# Patient Record
Sex: Female | Born: 1950 | Race: White | Hispanic: No | Marital: Married | Smoking: Never smoker
Health system: Southern US, Community
[De-identification: ages and names within clinical notes are randomized; demographics above are authoritative.]

## PROBLEM LIST (undated history)

## (undated) DIAGNOSIS — F32A Depression, unspecified: Secondary | ICD-10-CM

## (undated) DIAGNOSIS — J45909 Unspecified asthma, uncomplicated: Secondary | ICD-10-CM

## (undated) DIAGNOSIS — E039 Hypothyroidism, unspecified: Secondary | ICD-10-CM

## (undated) DIAGNOSIS — E041 Nontoxic single thyroid nodule: Secondary | ICD-10-CM

## (undated) DIAGNOSIS — I10 Essential (primary) hypertension: Secondary | ICD-10-CM

## (undated) DIAGNOSIS — F419 Anxiety disorder, unspecified: Secondary | ICD-10-CM

## (undated) DIAGNOSIS — R011 Cardiac murmur, unspecified: Secondary | ICD-10-CM

## (undated) DIAGNOSIS — J189 Pneumonia, unspecified organism: Secondary | ICD-10-CM

## (undated) DIAGNOSIS — R7303 Prediabetes: Secondary | ICD-10-CM

## (undated) DIAGNOSIS — M1990 Unspecified osteoarthritis, unspecified site: Secondary | ICD-10-CM

## (undated) DIAGNOSIS — R51 Headache: Secondary | ICD-10-CM

## (undated) DIAGNOSIS — R519 Headache, unspecified: Secondary | ICD-10-CM

## (undated) DIAGNOSIS — K219 Gastro-esophageal reflux disease without esophagitis: Secondary | ICD-10-CM

## (undated) HISTORY — PX: OTHER SURGICAL HISTORY: SHX169

## (undated) HISTORY — PX: ABDOMINAL HYSTERECTOMY: SHX81

## (undated) HISTORY — PX: THYROID LOBECTOMY: SHX420

---

## 1998-10-17 ENCOUNTER — Ambulatory Visit (HOSPITAL_COMMUNITY): Admission: EM | Admit: 1998-10-17 | Discharge: 1998-10-17 | Payer: Self-pay | Admitting: Emergency Medicine

## 1998-11-06 ENCOUNTER — Ambulatory Visit (HOSPITAL_COMMUNITY): Admission: RE | Admit: 1998-11-06 | Discharge: 1998-11-06 | Payer: Self-pay | Admitting: Internal Medicine

## 1998-11-06 ENCOUNTER — Encounter: Payer: Self-pay | Admitting: Internal Medicine

## 1998-11-21 ENCOUNTER — Encounter: Payer: Self-pay | Admitting: Internal Medicine

## 1998-11-21 ENCOUNTER — Ambulatory Visit (HOSPITAL_COMMUNITY): Admission: RE | Admit: 1998-11-21 | Discharge: 1998-11-21 | Payer: Self-pay | Admitting: Internal Medicine

## 1999-04-18 ENCOUNTER — Encounter: Payer: Self-pay | Admitting: Obstetrics and Gynecology

## 1999-04-18 ENCOUNTER — Ambulatory Visit (HOSPITAL_COMMUNITY): Admission: RE | Admit: 1999-04-18 | Discharge: 1999-04-18 | Payer: Self-pay | Admitting: Obstetrics and Gynecology

## 1999-05-25 ENCOUNTER — Other Ambulatory Visit: Admission: RE | Admit: 1999-05-25 | Discharge: 1999-05-25 | Payer: Self-pay | Admitting: Endocrinology

## 1999-09-18 ENCOUNTER — Encounter: Payer: Self-pay | Admitting: Obstetrics and Gynecology

## 1999-09-18 ENCOUNTER — Encounter: Admission: RE | Admit: 1999-09-18 | Discharge: 1999-09-18 | Payer: Self-pay | Admitting: Obstetrics and Gynecology

## 2001-02-16 ENCOUNTER — Encounter: Payer: Self-pay | Admitting: Obstetrics and Gynecology

## 2001-02-16 ENCOUNTER — Encounter: Admission: RE | Admit: 2001-02-16 | Discharge: 2001-02-16 | Payer: Self-pay | Admitting: Obstetrics and Gynecology

## 2002-02-22 ENCOUNTER — Encounter: Payer: Self-pay | Admitting: Obstetrics and Gynecology

## 2002-02-22 ENCOUNTER — Encounter: Admission: RE | Admit: 2002-02-22 | Discharge: 2002-02-22 | Payer: Self-pay | Admitting: Obstetrics and Gynecology

## 2003-02-27 ENCOUNTER — Encounter: Payer: Self-pay | Admitting: Obstetrics and Gynecology

## 2003-02-27 ENCOUNTER — Encounter: Admission: RE | Admit: 2003-02-27 | Discharge: 2003-02-27 | Payer: Self-pay | Admitting: Family Medicine

## 2004-02-29 ENCOUNTER — Encounter: Admission: RE | Admit: 2004-02-29 | Discharge: 2004-02-29 | Payer: Self-pay | Admitting: Obstetrics and Gynecology

## 2010-10-22 ENCOUNTER — Other Ambulatory Visit: Payer: Self-pay | Admitting: Internal Medicine

## 2010-10-22 DIAGNOSIS — E041 Nontoxic single thyroid nodule: Secondary | ICD-10-CM

## 2010-10-30 ENCOUNTER — Ambulatory Visit
Admission: RE | Admit: 2010-10-30 | Discharge: 2010-10-30 | Disposition: A | Discharge status: Home / Self Care | Payer: 59 | Source: Ambulatory Visit | Attending: Internal Medicine | Admitting: Internal Medicine

## 2010-10-30 DIAGNOSIS — E041 Nontoxic single thyroid nodule: Secondary | ICD-10-CM

## 2010-11-04 ENCOUNTER — Other Ambulatory Visit: Payer: Self-pay | Admitting: Internal Medicine

## 2010-11-04 DIAGNOSIS — E041 Nontoxic single thyroid nodule: Secondary | ICD-10-CM

## 2010-11-13 ENCOUNTER — Other Ambulatory Visit: Payer: Self-pay | Admitting: Diagnostic Radiology

## 2010-11-13 ENCOUNTER — Ambulatory Visit
Admission: RE | Admit: 2010-11-13 | Discharge: 2010-11-13 | Disposition: A | Discharge status: Home / Self Care | Payer: 59 | Source: Ambulatory Visit

## 2010-11-13 ENCOUNTER — Other Ambulatory Visit (HOSPITAL_COMMUNITY)
Admission: RE | Admit: 2010-11-13 | Discharge: 2010-11-13 | Disposition: A | Discharge status: Home / Self Care | Payer: 59 | Source: Ambulatory Visit | Attending: Diagnostic Radiology | Admitting: Diagnostic Radiology

## 2010-11-13 DIAGNOSIS — E049 Nontoxic goiter, unspecified: Secondary | ICD-10-CM | POA: Insufficient documentation

## 2010-11-13 DIAGNOSIS — E041 Nontoxic single thyroid nodule: Secondary | ICD-10-CM

## 2010-11-22 ENCOUNTER — Other Ambulatory Visit: Payer: Self-pay | Admitting: Internal Medicine

## 2010-11-22 ENCOUNTER — Other Ambulatory Visit: Payer: Self-pay | Admitting: *Deleted

## 2010-11-22 DIAGNOSIS — E041 Nontoxic single thyroid nodule: Secondary | ICD-10-CM

## 2011-10-22 ENCOUNTER — Other Ambulatory Visit: Payer: Self-pay | Admitting: Internal Medicine

## 2011-10-22 DIAGNOSIS — E041 Nontoxic single thyroid nodule: Secondary | ICD-10-CM

## 2011-11-20 ENCOUNTER — Ambulatory Visit
Admission: RE | Admit: 2011-11-20 | Discharge: 2011-11-20 | Disposition: A | Discharge status: Home / Self Care | Payer: 59 | Source: Ambulatory Visit | Attending: Internal Medicine | Admitting: Internal Medicine

## 2011-11-20 DIAGNOSIS — E041 Nontoxic single thyroid nodule: Secondary | ICD-10-CM

## 2012-11-19 ENCOUNTER — Ambulatory Visit
Admission: RE | Admit: 2012-11-19 | Discharge: 2012-11-19 | Disposition: A | Discharge status: Home / Self Care | Payer: 59 | Source: Ambulatory Visit | Attending: Internal Medicine | Admitting: Internal Medicine

## 2012-11-19 ENCOUNTER — Other Ambulatory Visit: Payer: Self-pay | Admitting: Internal Medicine

## 2012-11-19 DIAGNOSIS — E041 Nontoxic single thyroid nodule: Secondary | ICD-10-CM

## 2012-11-22 ENCOUNTER — Other Ambulatory Visit: Payer: Self-pay | Admitting: Family Medicine

## 2012-11-22 ENCOUNTER — Ambulatory Visit
Admission: RE | Admit: 2012-11-22 | Discharge: 2012-11-22 | Disposition: A | Discharge status: Home / Self Care | Payer: 59 | Source: Ambulatory Visit | Attending: Family Medicine | Admitting: Family Medicine

## 2012-11-22 DIAGNOSIS — M79601 Pain in right arm: Secondary | ICD-10-CM

## 2012-11-22 DIAGNOSIS — M25561 Pain in right knee: Secondary | ICD-10-CM

## 2013-07-25 ENCOUNTER — Other Ambulatory Visit: Payer: Self-pay | Admitting: Family Medicine

## 2013-07-25 ENCOUNTER — Ambulatory Visit
Admission: RE | Admit: 2013-07-25 | Discharge: 2013-07-25 | Disposition: A | Discharge status: Home / Self Care | Payer: 59 | Source: Ambulatory Visit | Attending: Family Medicine | Admitting: Family Medicine

## 2013-07-25 DIAGNOSIS — R21 Rash and other nonspecific skin eruption: Secondary | ICD-10-CM

## 2013-07-25 MED ORDER — IOHEXOL 300 MG/ML  SOLN
40.0000 mL | Freq: Once | INTRAMUSCULAR | Status: AC | PRN
  Administered 2013-07-25: 40 mL via ORAL

## 2013-07-25 MED ORDER — IOHEXOL 300 MG/ML  SOLN
100.0000 mL | Freq: Once | INTRAMUSCULAR | Status: AC | PRN
  Administered 2013-07-25: 100 mL via INTRAVENOUS

## 2013-08-04 ENCOUNTER — Other Ambulatory Visit: Payer: Self-pay

## 2013-08-04 DIAGNOSIS — Z1231 Encounter for screening mammogram for malignant neoplasm of breast: Secondary | ICD-10-CM

## 2013-08-18 ENCOUNTER — Ambulatory Visit
Admission: RE | Admit: 2013-08-18 | Discharge: 2013-08-18 | Disposition: A | Discharge status: Home / Self Care | Payer: 59 | Source: Ambulatory Visit

## 2013-08-18 DIAGNOSIS — Z1231 Encounter for screening mammogram for malignant neoplasm of breast: Secondary | ICD-10-CM

## 2014-06-23 DIAGNOSIS — Z23 Encounter for immunization: Secondary | ICD-10-CM | POA: Diagnosis not present

## 2014-06-29 DIAGNOSIS — J301 Allergic rhinitis due to pollen: Secondary | ICD-10-CM | POA: Diagnosis not present

## 2014-06-29 DIAGNOSIS — J3089 Other allergic rhinitis: Secondary | ICD-10-CM | POA: Diagnosis not present

## 2014-07-10 DIAGNOSIS — J3089 Other allergic rhinitis: Secondary | ICD-10-CM | POA: Diagnosis not present

## 2014-07-10 DIAGNOSIS — J301 Allergic rhinitis due to pollen: Secondary | ICD-10-CM | POA: Diagnosis not present

## 2014-07-19 DIAGNOSIS — J4540 Moderate persistent asthma, uncomplicated: Secondary | ICD-10-CM | POA: Diagnosis not present

## 2014-07-19 DIAGNOSIS — J3089 Other allergic rhinitis: Secondary | ICD-10-CM | POA: Diagnosis not present

## 2014-07-21 DIAGNOSIS — I10 Essential (primary) hypertension: Secondary | ICD-10-CM | POA: Diagnosis not present

## 2014-07-21 DIAGNOSIS — M7989 Other specified soft tissue disorders: Secondary | ICD-10-CM | POA: Diagnosis not present

## 2014-07-21 DIAGNOSIS — R609 Edema, unspecified: Secondary | ICD-10-CM | POA: Diagnosis not present

## 2014-07-21 DIAGNOSIS — M7122 Synovial cyst of popliteal space [Baker], left knee: Secondary | ICD-10-CM | POA: Diagnosis not present

## 2014-07-27 DIAGNOSIS — J3089 Other allergic rhinitis: Secondary | ICD-10-CM | POA: Diagnosis not present

## 2014-07-27 DIAGNOSIS — J301 Allergic rhinitis due to pollen: Secondary | ICD-10-CM | POA: Diagnosis not present

## 2014-08-09 ENCOUNTER — Other Ambulatory Visit (HOSPITAL_COMMUNITY)
Admission: RE | Admit: 2014-08-09 | Discharge: 2014-08-09 | Disposition: A | Discharge status: Home / Self Care | Payer: Medicare Other | Source: Ambulatory Visit | Attending: Family Medicine | Admitting: Family Medicine

## 2014-08-09 ENCOUNTER — Other Ambulatory Visit: Payer: Self-pay | Admitting: Family Medicine

## 2014-08-09 DIAGNOSIS — I10 Essential (primary) hypertension: Secondary | ICD-10-CM | POA: Diagnosis not present

## 2014-08-09 DIAGNOSIS — Z124 Encounter for screening for malignant neoplasm of cervix: Secondary | ICD-10-CM | POA: Insufficient documentation

## 2014-08-09 DIAGNOSIS — Z0000 Encounter for general adult medical examination without abnormal findings: Secondary | ICD-10-CM | POA: Diagnosis not present

## 2014-08-10 DIAGNOSIS — J3089 Other allergic rhinitis: Secondary | ICD-10-CM | POA: Diagnosis not present

## 2014-08-10 DIAGNOSIS — J301 Allergic rhinitis due to pollen: Secondary | ICD-10-CM | POA: Diagnosis not present

## 2014-08-11 LAB — CYTOLOGY - PAP

## 2014-08-24 DIAGNOSIS — J3089 Other allergic rhinitis: Secondary | ICD-10-CM | POA: Diagnosis not present

## 2014-08-24 DIAGNOSIS — J301 Allergic rhinitis due to pollen: Secondary | ICD-10-CM | POA: Diagnosis not present

## 2014-09-04 DIAGNOSIS — J3089 Other allergic rhinitis: Secondary | ICD-10-CM | POA: Diagnosis not present

## 2014-09-04 DIAGNOSIS — J301 Allergic rhinitis due to pollen: Secondary | ICD-10-CM | POA: Diagnosis not present

## 2014-09-20 DIAGNOSIS — J3089 Other allergic rhinitis: Secondary | ICD-10-CM | POA: Diagnosis not present

## 2014-09-20 DIAGNOSIS — J301 Allergic rhinitis due to pollen: Secondary | ICD-10-CM | POA: Diagnosis not present

## 2014-10-03 DIAGNOSIS — J3089 Other allergic rhinitis: Secondary | ICD-10-CM | POA: Diagnosis not present

## 2014-10-03 DIAGNOSIS — J301 Allergic rhinitis due to pollen: Secondary | ICD-10-CM | POA: Diagnosis not present

## 2014-10-17 DIAGNOSIS — J3089 Other allergic rhinitis: Secondary | ICD-10-CM | POA: Diagnosis not present

## 2014-10-17 DIAGNOSIS — J301 Allergic rhinitis due to pollen: Secondary | ICD-10-CM | POA: Diagnosis not present

## 2014-10-31 DIAGNOSIS — J3089 Other allergic rhinitis: Secondary | ICD-10-CM | POA: Diagnosis not present

## 2014-10-31 DIAGNOSIS — J301 Allergic rhinitis due to pollen: Secondary | ICD-10-CM | POA: Diagnosis not present

## 2014-11-13 DIAGNOSIS — J301 Allergic rhinitis due to pollen: Secondary | ICD-10-CM | POA: Diagnosis not present

## 2014-11-23 DIAGNOSIS — E039 Hypothyroidism, unspecified: Secondary | ICD-10-CM | POA: Diagnosis not present

## 2014-11-30 ENCOUNTER — Other Ambulatory Visit: Payer: Self-pay | Admitting: Internal Medicine

## 2014-11-30 DIAGNOSIS — E041 Nontoxic single thyroid nodule: Secondary | ICD-10-CM | POA: Diagnosis not present

## 2014-11-30 DIAGNOSIS — E039 Hypothyroidism, unspecified: Secondary | ICD-10-CM | POA: Diagnosis not present

## 2014-11-30 DIAGNOSIS — J3089 Other allergic rhinitis: Secondary | ICD-10-CM | POA: Diagnosis not present

## 2014-11-30 DIAGNOSIS — J301 Allergic rhinitis due to pollen: Secondary | ICD-10-CM | POA: Diagnosis not present

## 2014-12-05 ENCOUNTER — Ambulatory Visit
Admission: RE | Admit: 2014-12-05 | Discharge: 2014-12-05 | Disposition: A | Discharge status: Home / Self Care | Payer: Medicare Other | Source: Ambulatory Visit | Attending: Internal Medicine | Admitting: Internal Medicine

## 2014-12-05 DIAGNOSIS — E041 Nontoxic single thyroid nodule: Secondary | ICD-10-CM | POA: Diagnosis not present

## 2014-12-13 DIAGNOSIS — J301 Allergic rhinitis due to pollen: Secondary | ICD-10-CM | POA: Diagnosis not present

## 2014-12-13 DIAGNOSIS — J3089 Other allergic rhinitis: Secondary | ICD-10-CM | POA: Diagnosis not present

## 2014-12-29 DIAGNOSIS — J3089 Other allergic rhinitis: Secondary | ICD-10-CM | POA: Diagnosis not present

## 2015-01-10 DIAGNOSIS — J301 Allergic rhinitis due to pollen: Secondary | ICD-10-CM | POA: Diagnosis not present

## 2015-01-10 DIAGNOSIS — J3089 Other allergic rhinitis: Secondary | ICD-10-CM | POA: Diagnosis not present

## 2015-01-22 DIAGNOSIS — J4530 Mild persistent asthma, uncomplicated: Secondary | ICD-10-CM | POA: Diagnosis not present

## 2015-01-22 DIAGNOSIS — J3089 Other allergic rhinitis: Secondary | ICD-10-CM | POA: Diagnosis not present

## 2015-01-22 DIAGNOSIS — J019 Acute sinusitis, unspecified: Secondary | ICD-10-CM | POA: Diagnosis not present

## 2015-01-29 DIAGNOSIS — M1712 Unilateral primary osteoarthritis, left knee: Secondary | ICD-10-CM | POA: Diagnosis not present

## 2015-01-31 DIAGNOSIS — J3089 Other allergic rhinitis: Secondary | ICD-10-CM | POA: Diagnosis not present

## 2015-01-31 DIAGNOSIS — J301 Allergic rhinitis due to pollen: Secondary | ICD-10-CM | POA: Diagnosis not present

## 2015-02-19 DIAGNOSIS — J3089 Other allergic rhinitis: Secondary | ICD-10-CM | POA: Diagnosis not present

## 2015-02-19 DIAGNOSIS — J301 Allergic rhinitis due to pollen: Secondary | ICD-10-CM | POA: Diagnosis not present

## 2015-02-19 DIAGNOSIS — M1712 Unilateral primary osteoarthritis, left knee: Secondary | ICD-10-CM | POA: Diagnosis not present

## 2015-02-22 DIAGNOSIS — J301 Allergic rhinitis due to pollen: Secondary | ICD-10-CM | POA: Diagnosis not present

## 2015-02-22 DIAGNOSIS — J3089 Other allergic rhinitis: Secondary | ICD-10-CM | POA: Diagnosis not present

## 2015-02-26 DIAGNOSIS — M1712 Unilateral primary osteoarthritis, left knee: Secondary | ICD-10-CM | POA: Diagnosis not present

## 2015-03-05 DIAGNOSIS — M1712 Unilateral primary osteoarthritis, left knee: Secondary | ICD-10-CM | POA: Diagnosis not present

## 2015-03-05 DIAGNOSIS — J301 Allergic rhinitis due to pollen: Secondary | ICD-10-CM | POA: Diagnosis not present

## 2015-03-05 DIAGNOSIS — J3089 Other allergic rhinitis: Secondary | ICD-10-CM | POA: Diagnosis not present

## 2015-03-12 DIAGNOSIS — J301 Allergic rhinitis due to pollen: Secondary | ICD-10-CM | POA: Diagnosis not present

## 2015-03-12 DIAGNOSIS — J3089 Other allergic rhinitis: Secondary | ICD-10-CM | POA: Diagnosis not present

## 2015-03-15 DIAGNOSIS — J3089 Other allergic rhinitis: Secondary | ICD-10-CM | POA: Diagnosis not present

## 2015-03-15 DIAGNOSIS — J301 Allergic rhinitis due to pollen: Secondary | ICD-10-CM | POA: Diagnosis not present

## 2015-03-28 DIAGNOSIS — J3089 Other allergic rhinitis: Secondary | ICD-10-CM | POA: Diagnosis not present

## 2015-03-28 DIAGNOSIS — J301 Allergic rhinitis due to pollen: Secondary | ICD-10-CM | POA: Diagnosis not present

## 2015-03-29 DIAGNOSIS — Z23 Encounter for immunization: Secondary | ICD-10-CM | POA: Diagnosis not present

## 2015-03-30 DIAGNOSIS — J3089 Other allergic rhinitis: Secondary | ICD-10-CM | POA: Diagnosis not present

## 2015-03-30 DIAGNOSIS — J301 Allergic rhinitis due to pollen: Secondary | ICD-10-CM | POA: Diagnosis not present

## 2015-04-05 DIAGNOSIS — J301 Allergic rhinitis due to pollen: Secondary | ICD-10-CM | POA: Diagnosis not present

## 2015-04-05 DIAGNOSIS — J3089 Other allergic rhinitis: Secondary | ICD-10-CM | POA: Diagnosis not present

## 2015-04-18 DIAGNOSIS — Z1283 Encounter for screening for malignant neoplasm of skin: Secondary | ICD-10-CM | POA: Diagnosis not present

## 2015-04-18 DIAGNOSIS — J301 Allergic rhinitis due to pollen: Secondary | ICD-10-CM | POA: Diagnosis not present

## 2015-04-18 DIAGNOSIS — J3089 Other allergic rhinitis: Secondary | ICD-10-CM | POA: Diagnosis not present

## 2015-04-18 DIAGNOSIS — L281 Prurigo nodularis: Secondary | ICD-10-CM | POA: Diagnosis not present

## 2015-04-30 DIAGNOSIS — J3089 Other allergic rhinitis: Secondary | ICD-10-CM | POA: Diagnosis not present

## 2015-04-30 DIAGNOSIS — J301 Allergic rhinitis due to pollen: Secondary | ICD-10-CM | POA: Diagnosis not present

## 2015-05-16 DIAGNOSIS — J301 Allergic rhinitis due to pollen: Secondary | ICD-10-CM | POA: Diagnosis not present

## 2015-05-16 DIAGNOSIS — J3089 Other allergic rhinitis: Secondary | ICD-10-CM | POA: Diagnosis not present

## 2015-05-16 DIAGNOSIS — J4530 Mild persistent asthma, uncomplicated: Secondary | ICD-10-CM | POA: Diagnosis not present

## 2015-05-31 DIAGNOSIS — J3089 Other allergic rhinitis: Secondary | ICD-10-CM | POA: Diagnosis not present

## 2015-05-31 DIAGNOSIS — J301 Allergic rhinitis due to pollen: Secondary | ICD-10-CM | POA: Diagnosis not present

## 2015-06-22 DIAGNOSIS — J3089 Other allergic rhinitis: Secondary | ICD-10-CM | POA: Diagnosis not present

## 2015-06-22 DIAGNOSIS — J301 Allergic rhinitis due to pollen: Secondary | ICD-10-CM | POA: Diagnosis not present

## 2015-07-05 DIAGNOSIS — J3089 Other allergic rhinitis: Secondary | ICD-10-CM | POA: Diagnosis not present

## 2015-07-05 DIAGNOSIS — J301 Allergic rhinitis due to pollen: Secondary | ICD-10-CM | POA: Diagnosis not present

## 2015-07-20 DIAGNOSIS — J3089 Other allergic rhinitis: Secondary | ICD-10-CM | POA: Diagnosis not present

## 2015-07-20 DIAGNOSIS — J301 Allergic rhinitis due to pollen: Secondary | ICD-10-CM | POA: Diagnosis not present

## 2015-07-26 DIAGNOSIS — F312 Bipolar disorder, current episode manic severe with psychotic features: Secondary | ICD-10-CM | POA: Diagnosis not present

## 2015-08-01 DIAGNOSIS — J3089 Other allergic rhinitis: Secondary | ICD-10-CM | POA: Diagnosis not present

## 2015-08-01 DIAGNOSIS — J301 Allergic rhinitis due to pollen: Secondary | ICD-10-CM | POA: Diagnosis not present

## 2015-08-13 DIAGNOSIS — Z0000 Encounter for general adult medical examination without abnormal findings: Secondary | ICD-10-CM | POA: Diagnosis not present

## 2015-08-13 DIAGNOSIS — E669 Obesity, unspecified: Secondary | ICD-10-CM | POA: Diagnosis not present

## 2015-08-13 DIAGNOSIS — R609 Edema, unspecified: Secondary | ICD-10-CM | POA: Diagnosis not present

## 2015-08-13 DIAGNOSIS — Z1239 Encounter for other screening for malignant neoplasm of breast: Secondary | ICD-10-CM | POA: Diagnosis not present

## 2015-08-13 DIAGNOSIS — E041 Nontoxic single thyroid nodule: Secondary | ICD-10-CM | POA: Diagnosis not present

## 2015-08-13 DIAGNOSIS — E039 Hypothyroidism, unspecified: Secondary | ICD-10-CM | POA: Diagnosis not present

## 2015-08-13 DIAGNOSIS — I10 Essential (primary) hypertension: Secondary | ICD-10-CM | POA: Diagnosis not present

## 2015-08-13 DIAGNOSIS — M1990 Unspecified osteoarthritis, unspecified site: Secondary | ICD-10-CM | POA: Diagnosis not present

## 2015-08-13 DIAGNOSIS — M2040 Other hammer toe(s) (acquired), unspecified foot: Secondary | ICD-10-CM | POA: Diagnosis not present

## 2015-08-13 DIAGNOSIS — L409 Psoriasis, unspecified: Secondary | ICD-10-CM | POA: Diagnosis not present

## 2015-08-13 DIAGNOSIS — E781 Pure hyperglyceridemia: Secondary | ICD-10-CM | POA: Diagnosis not present

## 2015-08-23 DIAGNOSIS — J019 Acute sinusitis, unspecified: Secondary | ICD-10-CM | POA: Diagnosis not present

## 2015-08-24 DIAGNOSIS — J3089 Other allergic rhinitis: Secondary | ICD-10-CM | POA: Diagnosis not present

## 2015-08-24 DIAGNOSIS — J301 Allergic rhinitis due to pollen: Secondary | ICD-10-CM | POA: Diagnosis not present

## 2015-08-24 DIAGNOSIS — Z1231 Encounter for screening mammogram for malignant neoplasm of breast: Secondary | ICD-10-CM | POA: Diagnosis not present

## 2015-09-06 DIAGNOSIS — J301 Allergic rhinitis due to pollen: Secondary | ICD-10-CM | POA: Diagnosis not present

## 2015-09-20 DIAGNOSIS — J3089 Other allergic rhinitis: Secondary | ICD-10-CM | POA: Diagnosis not present

## 2015-09-20 DIAGNOSIS — J301 Allergic rhinitis due to pollen: Secondary | ICD-10-CM | POA: Diagnosis not present

## 2015-10-04 DIAGNOSIS — J3089 Other allergic rhinitis: Secondary | ICD-10-CM | POA: Diagnosis not present

## 2015-10-04 DIAGNOSIS — J301 Allergic rhinitis due to pollen: Secondary | ICD-10-CM | POA: Diagnosis not present

## 2015-10-17 DIAGNOSIS — J3089 Other allergic rhinitis: Secondary | ICD-10-CM | POA: Diagnosis not present

## 2015-10-17 DIAGNOSIS — J301 Allergic rhinitis due to pollen: Secondary | ICD-10-CM | POA: Diagnosis not present

## 2015-11-01 DIAGNOSIS — J301 Allergic rhinitis due to pollen: Secondary | ICD-10-CM | POA: Diagnosis not present

## 2015-11-01 DIAGNOSIS — J3089 Other allergic rhinitis: Secondary | ICD-10-CM | POA: Diagnosis not present

## 2015-11-08 DIAGNOSIS — J3089 Other allergic rhinitis: Secondary | ICD-10-CM | POA: Diagnosis not present

## 2015-11-08 DIAGNOSIS — J301 Allergic rhinitis due to pollen: Secondary | ICD-10-CM | POA: Diagnosis not present

## 2015-11-14 DIAGNOSIS — J301 Allergic rhinitis due to pollen: Secondary | ICD-10-CM | POA: Diagnosis not present

## 2015-11-14 DIAGNOSIS — J3089 Other allergic rhinitis: Secondary | ICD-10-CM | POA: Diagnosis not present

## 2015-11-14 DIAGNOSIS — J4530 Mild persistent asthma, uncomplicated: Secondary | ICD-10-CM | POA: Diagnosis not present

## 2015-11-29 DIAGNOSIS — J301 Allergic rhinitis due to pollen: Secondary | ICD-10-CM | POA: Diagnosis not present

## 2015-11-29 DIAGNOSIS — J3089 Other allergic rhinitis: Secondary | ICD-10-CM | POA: Diagnosis not present

## 2015-12-05 DIAGNOSIS — E039 Hypothyroidism, unspecified: Secondary | ICD-10-CM | POA: Diagnosis not present

## 2015-12-13 DIAGNOSIS — E041 Nontoxic single thyroid nodule: Secondary | ICD-10-CM | POA: Diagnosis not present

## 2015-12-13 DIAGNOSIS — J301 Allergic rhinitis due to pollen: Secondary | ICD-10-CM | POA: Diagnosis not present

## 2015-12-13 DIAGNOSIS — E039 Hypothyroidism, unspecified: Secondary | ICD-10-CM | POA: Diagnosis not present

## 2015-12-13 DIAGNOSIS — J3089 Other allergic rhinitis: Secondary | ICD-10-CM | POA: Diagnosis not present

## 2015-12-25 DIAGNOSIS — J3089 Other allergic rhinitis: Secondary | ICD-10-CM | POA: Diagnosis not present

## 2015-12-25 DIAGNOSIS — J301 Allergic rhinitis due to pollen: Secondary | ICD-10-CM | POA: Diagnosis not present

## 2015-12-28 DIAGNOSIS — J301 Allergic rhinitis due to pollen: Secondary | ICD-10-CM | POA: Diagnosis not present

## 2015-12-28 DIAGNOSIS — J3089 Other allergic rhinitis: Secondary | ICD-10-CM | POA: Diagnosis not present

## 2016-01-02 DIAGNOSIS — J301 Allergic rhinitis due to pollen: Secondary | ICD-10-CM | POA: Diagnosis not present

## 2016-01-02 DIAGNOSIS — J3089 Other allergic rhinitis: Secondary | ICD-10-CM | POA: Diagnosis not present

## 2016-01-07 DIAGNOSIS — I10 Essential (primary) hypertension: Secondary | ICD-10-CM | POA: Diagnosis not present

## 2016-01-07 DIAGNOSIS — F4329 Adjustment disorder with other symptoms: Secondary | ICD-10-CM | POA: Diagnosis not present

## 2016-01-07 DIAGNOSIS — E039 Hypothyroidism, unspecified: Secondary | ICD-10-CM | POA: Diagnosis not present

## 2016-01-07 DIAGNOSIS — J301 Allergic rhinitis due to pollen: Secondary | ICD-10-CM | POA: Diagnosis not present

## 2016-01-07 DIAGNOSIS — E784 Other hyperlipidemia: Secondary | ICD-10-CM | POA: Diagnosis not present

## 2016-01-07 DIAGNOSIS — E041 Nontoxic single thyroid nodule: Secondary | ICD-10-CM | POA: Diagnosis not present

## 2016-01-07 DIAGNOSIS — J3089 Other allergic rhinitis: Secondary | ICD-10-CM | POA: Diagnosis not present

## 2016-01-09 DIAGNOSIS — J3089 Other allergic rhinitis: Secondary | ICD-10-CM | POA: Diagnosis not present

## 2016-01-09 DIAGNOSIS — J301 Allergic rhinitis due to pollen: Secondary | ICD-10-CM | POA: Diagnosis not present

## 2016-01-23 DIAGNOSIS — J301 Allergic rhinitis due to pollen: Secondary | ICD-10-CM | POA: Diagnosis not present

## 2016-01-23 DIAGNOSIS — J3089 Other allergic rhinitis: Secondary | ICD-10-CM | POA: Diagnosis not present

## 2016-02-13 DIAGNOSIS — J301 Allergic rhinitis due to pollen: Secondary | ICD-10-CM | POA: Diagnosis not present

## 2016-02-13 DIAGNOSIS — J3089 Other allergic rhinitis: Secondary | ICD-10-CM | POA: Diagnosis not present

## 2016-02-25 DIAGNOSIS — J3089 Other allergic rhinitis: Secondary | ICD-10-CM | POA: Diagnosis not present

## 2016-02-25 DIAGNOSIS — J301 Allergic rhinitis due to pollen: Secondary | ICD-10-CM | POA: Diagnosis not present

## 2016-03-13 DIAGNOSIS — J3089 Other allergic rhinitis: Secondary | ICD-10-CM | POA: Diagnosis not present

## 2016-03-13 DIAGNOSIS — J301 Allergic rhinitis due to pollen: Secondary | ICD-10-CM | POA: Diagnosis not present

## 2016-03-16 DIAGNOSIS — Z23 Encounter for immunization: Secondary | ICD-10-CM | POA: Diagnosis not present

## 2016-03-26 DIAGNOSIS — J301 Allergic rhinitis due to pollen: Secondary | ICD-10-CM | POA: Diagnosis not present

## 2016-03-26 DIAGNOSIS — J3089 Other allergic rhinitis: Secondary | ICD-10-CM | POA: Diagnosis not present

## 2016-04-10 DIAGNOSIS — J301 Allergic rhinitis due to pollen: Secondary | ICD-10-CM | POA: Diagnosis not present

## 2016-04-10 DIAGNOSIS — I10 Essential (primary) hypertension: Secondary | ICD-10-CM | POA: Diagnosis not present

## 2016-04-10 DIAGNOSIS — E041 Nontoxic single thyroid nodule: Secondary | ICD-10-CM | POA: Diagnosis not present

## 2016-04-10 DIAGNOSIS — E784 Other hyperlipidemia: Secondary | ICD-10-CM | POA: Diagnosis not present

## 2016-04-10 DIAGNOSIS — F4329 Adjustment disorder with other symptoms: Secondary | ICD-10-CM | POA: Diagnosis not present

## 2016-04-10 DIAGNOSIS — J3089 Other allergic rhinitis: Secondary | ICD-10-CM | POA: Diagnosis not present

## 2016-04-10 DIAGNOSIS — E039 Hypothyroidism, unspecified: Secondary | ICD-10-CM | POA: Diagnosis not present

## 2016-04-21 DIAGNOSIS — J301 Allergic rhinitis due to pollen: Secondary | ICD-10-CM | POA: Diagnosis not present

## 2016-04-21 DIAGNOSIS — J019 Acute sinusitis, unspecified: Secondary | ICD-10-CM | POA: Diagnosis not present

## 2016-04-21 DIAGNOSIS — J3089 Other allergic rhinitis: Secondary | ICD-10-CM | POA: Diagnosis not present

## 2016-04-21 DIAGNOSIS — J4530 Mild persistent asthma, uncomplicated: Secondary | ICD-10-CM | POA: Diagnosis not present

## 2016-05-01 DIAGNOSIS — J301 Allergic rhinitis due to pollen: Secondary | ICD-10-CM | POA: Diagnosis not present

## 2016-05-01 DIAGNOSIS — J3089 Other allergic rhinitis: Secondary | ICD-10-CM | POA: Diagnosis not present

## 2016-05-07 DIAGNOSIS — L821 Other seborrheic keratosis: Secondary | ICD-10-CM | POA: Diagnosis not present

## 2016-05-07 DIAGNOSIS — B9689 Other specified bacterial agents as the cause of diseases classified elsewhere: Secondary | ICD-10-CM | POA: Diagnosis not present

## 2016-05-07 DIAGNOSIS — Z1283 Encounter for screening for malignant neoplasm of skin: Secondary | ICD-10-CM | POA: Diagnosis not present

## 2016-05-07 DIAGNOSIS — L02223 Furuncle of chest wall: Secondary | ICD-10-CM | POA: Diagnosis not present

## 2016-05-16 DIAGNOSIS — J3089 Other allergic rhinitis: Secondary | ICD-10-CM | POA: Diagnosis not present

## 2016-05-16 DIAGNOSIS — J301 Allergic rhinitis due to pollen: Secondary | ICD-10-CM | POA: Diagnosis not present

## 2016-05-21 DIAGNOSIS — J301 Allergic rhinitis due to pollen: Secondary | ICD-10-CM | POA: Diagnosis not present

## 2016-05-21 DIAGNOSIS — J4540 Moderate persistent asthma, uncomplicated: Secondary | ICD-10-CM | POA: Diagnosis not present

## 2016-05-21 DIAGNOSIS — J3089 Other allergic rhinitis: Secondary | ICD-10-CM | POA: Diagnosis not present

## 2016-05-28 DIAGNOSIS — J3089 Other allergic rhinitis: Secondary | ICD-10-CM | POA: Diagnosis not present

## 2016-05-28 DIAGNOSIS — J301 Allergic rhinitis due to pollen: Secondary | ICD-10-CM | POA: Diagnosis not present

## 2016-06-18 DIAGNOSIS — J3089 Other allergic rhinitis: Secondary | ICD-10-CM | POA: Diagnosis not present

## 2016-06-18 DIAGNOSIS — J301 Allergic rhinitis due to pollen: Secondary | ICD-10-CM | POA: Diagnosis not present

## 2016-06-30 DIAGNOSIS — J301 Allergic rhinitis due to pollen: Secondary | ICD-10-CM | POA: Diagnosis not present

## 2016-06-30 DIAGNOSIS — J3089 Other allergic rhinitis: Secondary | ICD-10-CM | POA: Diagnosis not present

## 2016-07-20 DIAGNOSIS — J069 Acute upper respiratory infection, unspecified: Secondary | ICD-10-CM | POA: Diagnosis not present

## 2016-07-20 DIAGNOSIS — J209 Acute bronchitis, unspecified: Secondary | ICD-10-CM | POA: Diagnosis not present

## 2016-07-23 DIAGNOSIS — J019 Acute sinusitis, unspecified: Secondary | ICD-10-CM | POA: Diagnosis not present

## 2016-07-23 DIAGNOSIS — J4540 Moderate persistent asthma, uncomplicated: Secondary | ICD-10-CM | POA: Diagnosis not present

## 2016-07-23 DIAGNOSIS — J3089 Other allergic rhinitis: Secondary | ICD-10-CM | POA: Diagnosis not present

## 2016-07-23 DIAGNOSIS — J301 Allergic rhinitis due to pollen: Secondary | ICD-10-CM | POA: Diagnosis not present

## 2016-08-04 DIAGNOSIS — J301 Allergic rhinitis due to pollen: Secondary | ICD-10-CM | POA: Diagnosis not present

## 2016-08-04 DIAGNOSIS — J3089 Other allergic rhinitis: Secondary | ICD-10-CM | POA: Diagnosis not present

## 2016-08-11 DIAGNOSIS — R739 Hyperglycemia, unspecified: Secondary | ICD-10-CM | POA: Diagnosis not present

## 2016-08-11 DIAGNOSIS — E041 Nontoxic single thyroid nodule: Secondary | ICD-10-CM | POA: Diagnosis not present

## 2016-08-11 DIAGNOSIS — E784 Other hyperlipidemia: Secondary | ICD-10-CM | POA: Diagnosis not present

## 2016-08-11 DIAGNOSIS — F4329 Adjustment disorder with other symptoms: Secondary | ICD-10-CM | POA: Diagnosis not present

## 2016-08-11 DIAGNOSIS — I10 Essential (primary) hypertension: Secondary | ICD-10-CM | POA: Diagnosis not present

## 2016-08-11 DIAGNOSIS — Z79899 Other long term (current) drug therapy: Secondary | ICD-10-CM | POA: Diagnosis not present

## 2016-08-11 DIAGNOSIS — E039 Hypothyroidism, unspecified: Secondary | ICD-10-CM | POA: Diagnosis not present

## 2016-08-25 DIAGNOSIS — J3089 Other allergic rhinitis: Secondary | ICD-10-CM | POA: Diagnosis not present

## 2016-08-25 DIAGNOSIS — J301 Allergic rhinitis due to pollen: Secondary | ICD-10-CM | POA: Diagnosis not present

## 2016-09-10 DIAGNOSIS — J301 Allergic rhinitis due to pollen: Secondary | ICD-10-CM | POA: Diagnosis not present

## 2016-09-10 DIAGNOSIS — J3089 Other allergic rhinitis: Secondary | ICD-10-CM | POA: Diagnosis not present

## 2016-09-16 DIAGNOSIS — J301 Allergic rhinitis due to pollen: Secondary | ICD-10-CM | POA: Diagnosis not present

## 2016-09-16 DIAGNOSIS — J3089 Other allergic rhinitis: Secondary | ICD-10-CM | POA: Diagnosis not present

## 2016-09-25 DIAGNOSIS — J3089 Other allergic rhinitis: Secondary | ICD-10-CM | POA: Diagnosis not present

## 2016-09-25 DIAGNOSIS — J301 Allergic rhinitis due to pollen: Secondary | ICD-10-CM | POA: Diagnosis not present

## 2016-10-15 DIAGNOSIS — J301 Allergic rhinitis due to pollen: Secondary | ICD-10-CM | POA: Diagnosis not present

## 2016-10-15 DIAGNOSIS — J3089 Other allergic rhinitis: Secondary | ICD-10-CM | POA: Diagnosis not present

## 2016-10-28 DIAGNOSIS — J301 Allergic rhinitis due to pollen: Secondary | ICD-10-CM | POA: Diagnosis not present

## 2016-10-28 DIAGNOSIS — J3089 Other allergic rhinitis: Secondary | ICD-10-CM | POA: Diagnosis not present

## 2016-11-05 DIAGNOSIS — J3089 Other allergic rhinitis: Secondary | ICD-10-CM | POA: Diagnosis not present

## 2016-11-05 DIAGNOSIS — J301 Allergic rhinitis due to pollen: Secondary | ICD-10-CM | POA: Diagnosis not present

## 2016-11-10 DIAGNOSIS — J3089 Other allergic rhinitis: Secondary | ICD-10-CM | POA: Diagnosis not present

## 2016-11-10 DIAGNOSIS — J301 Allergic rhinitis due to pollen: Secondary | ICD-10-CM | POA: Diagnosis not present

## 2016-11-12 DIAGNOSIS — J301 Allergic rhinitis due to pollen: Secondary | ICD-10-CM | POA: Diagnosis not present

## 2016-11-12 DIAGNOSIS — J3089 Other allergic rhinitis: Secondary | ICD-10-CM | POA: Diagnosis not present

## 2016-11-19 DIAGNOSIS — J301 Allergic rhinitis due to pollen: Secondary | ICD-10-CM | POA: Diagnosis not present

## 2016-11-19 DIAGNOSIS — J3089 Other allergic rhinitis: Secondary | ICD-10-CM | POA: Diagnosis not present

## 2016-11-26 DIAGNOSIS — J4540 Moderate persistent asthma, uncomplicated: Secondary | ICD-10-CM | POA: Diagnosis not present

## 2016-11-26 DIAGNOSIS — J301 Allergic rhinitis due to pollen: Secondary | ICD-10-CM | POA: Diagnosis not present

## 2016-11-26 DIAGNOSIS — J3089 Other allergic rhinitis: Secondary | ICD-10-CM | POA: Diagnosis not present

## 2016-12-03 DIAGNOSIS — J301 Allergic rhinitis due to pollen: Secondary | ICD-10-CM | POA: Diagnosis not present

## 2016-12-03 DIAGNOSIS — H2513 Age-related nuclear cataract, bilateral: Secondary | ICD-10-CM | POA: Diagnosis not present

## 2016-12-03 DIAGNOSIS — J3089 Other allergic rhinitis: Secondary | ICD-10-CM | POA: Diagnosis not present

## 2016-12-10 DIAGNOSIS — E039 Hypothyroidism, unspecified: Secondary | ICD-10-CM | POA: Diagnosis not present

## 2016-12-15 DIAGNOSIS — J301 Allergic rhinitis due to pollen: Secondary | ICD-10-CM | POA: Diagnosis not present

## 2016-12-15 DIAGNOSIS — J3089 Other allergic rhinitis: Secondary | ICD-10-CM | POA: Diagnosis not present

## 2016-12-17 ENCOUNTER — Other Ambulatory Visit: Payer: Self-pay | Admitting: Internal Medicine

## 2016-12-17 DIAGNOSIS — E039 Hypothyroidism, unspecified: Secondary | ICD-10-CM | POA: Diagnosis not present

## 2016-12-17 DIAGNOSIS — R490 Dysphonia: Secondary | ICD-10-CM | POA: Diagnosis not present

## 2016-12-17 DIAGNOSIS — J45909 Unspecified asthma, uncomplicated: Secondary | ICD-10-CM | POA: Diagnosis not present

## 2016-12-17 DIAGNOSIS — E041 Nontoxic single thyroid nodule: Secondary | ICD-10-CM | POA: Diagnosis not present

## 2016-12-31 ENCOUNTER — Ambulatory Visit
Admission: RE | Admit: 2016-12-31 | Discharge: 2016-12-31 | Disposition: A | Discharge status: Home / Self Care | Payer: Medicare Other | Source: Ambulatory Visit | Attending: Internal Medicine | Admitting: Internal Medicine

## 2016-12-31 DIAGNOSIS — E041 Nontoxic single thyroid nodule: Secondary | ICD-10-CM | POA: Diagnosis not present

## 2016-12-31 DIAGNOSIS — J3089 Other allergic rhinitis: Secondary | ICD-10-CM | POA: Diagnosis not present

## 2016-12-31 DIAGNOSIS — J301 Allergic rhinitis due to pollen: Secondary | ICD-10-CM | POA: Diagnosis not present

## 2017-01-06 ENCOUNTER — Other Ambulatory Visit: Payer: Self-pay | Admitting: Internal Medicine

## 2017-01-06 DIAGNOSIS — E041 Nontoxic single thyroid nodule: Secondary | ICD-10-CM

## 2017-01-07 ENCOUNTER — Ambulatory Visit
Admission: RE | Admit: 2017-01-07 | Discharge: 2017-01-07 | Disposition: A | Discharge status: Home / Self Care | Payer: Medicare Other | Source: Ambulatory Visit | Attending: Internal Medicine | Admitting: Internal Medicine

## 2017-01-07 ENCOUNTER — Other Ambulatory Visit (HOSPITAL_COMMUNITY)
Admission: RE | Admit: 2017-01-07 | Discharge: 2017-01-07 | Disposition: A | Discharge status: Home / Self Care | Payer: Medicare Other | Source: Ambulatory Visit | Attending: Radiology | Admitting: Radiology

## 2017-01-07 DIAGNOSIS — E041 Nontoxic single thyroid nodule: Secondary | ICD-10-CM | POA: Insufficient documentation

## 2017-01-20 DIAGNOSIS — M1712 Unilateral primary osteoarthritis, left knee: Secondary | ICD-10-CM | POA: Diagnosis not present

## 2017-01-21 ENCOUNTER — Other Ambulatory Visit: Payer: Medicare Other

## 2017-01-23 ENCOUNTER — Other Ambulatory Visit: Payer: Self-pay | Admitting: Internal Medicine

## 2017-01-23 DIAGNOSIS — J3089 Other allergic rhinitis: Secondary | ICD-10-CM | POA: Diagnosis not present

## 2017-01-23 DIAGNOSIS — E041 Nontoxic single thyroid nodule: Secondary | ICD-10-CM

## 2017-01-23 DIAGNOSIS — R490 Dysphonia: Secondary | ICD-10-CM | POA: Diagnosis not present

## 2017-01-23 DIAGNOSIS — J301 Allergic rhinitis due to pollen: Secondary | ICD-10-CM | POA: Diagnosis not present

## 2017-01-28 DIAGNOSIS — J301 Allergic rhinitis due to pollen: Secondary | ICD-10-CM | POA: Diagnosis not present

## 2017-01-28 DIAGNOSIS — J3089 Other allergic rhinitis: Secondary | ICD-10-CM | POA: Diagnosis not present

## 2017-02-05 DIAGNOSIS — F4329 Adjustment disorder with other symptoms: Secondary | ICD-10-CM | POA: Diagnosis not present

## 2017-02-05 DIAGNOSIS — E041 Nontoxic single thyroid nodule: Secondary | ICD-10-CM | POA: Diagnosis not present

## 2017-02-05 DIAGNOSIS — F4321 Adjustment disorder with depressed mood: Secondary | ICD-10-CM | POA: Diagnosis not present

## 2017-02-05 DIAGNOSIS — E039 Hypothyroidism, unspecified: Secondary | ICD-10-CM | POA: Diagnosis not present

## 2017-02-05 DIAGNOSIS — I10 Essential (primary) hypertension: Secondary | ICD-10-CM | POA: Diagnosis not present

## 2017-02-05 DIAGNOSIS — E784 Other hyperlipidemia: Secondary | ICD-10-CM | POA: Diagnosis not present

## 2017-02-05 DIAGNOSIS — R739 Hyperglycemia, unspecified: Secondary | ICD-10-CM | POA: Diagnosis not present

## 2017-02-06 DIAGNOSIS — E041 Nontoxic single thyroid nodule: Secondary | ICD-10-CM | POA: Diagnosis not present

## 2017-02-06 DIAGNOSIS — I10 Essential (primary) hypertension: Secondary | ICD-10-CM | POA: Diagnosis not present

## 2017-02-06 DIAGNOSIS — F4329 Adjustment disorder with other symptoms: Secondary | ICD-10-CM | POA: Diagnosis not present

## 2017-02-06 DIAGNOSIS — E784 Other hyperlipidemia: Secondary | ICD-10-CM | POA: Diagnosis not present

## 2017-02-06 DIAGNOSIS — E039 Hypothyroidism, unspecified: Secondary | ICD-10-CM | POA: Diagnosis not present

## 2017-02-06 DIAGNOSIS — R739 Hyperglycemia, unspecified: Secondary | ICD-10-CM | POA: Diagnosis not present

## 2017-02-12 DIAGNOSIS — J301 Allergic rhinitis due to pollen: Secondary | ICD-10-CM | POA: Diagnosis not present

## 2017-02-12 DIAGNOSIS — J3089 Other allergic rhinitis: Secondary | ICD-10-CM | POA: Diagnosis not present

## 2017-02-27 DIAGNOSIS — L308 Other specified dermatitis: Secondary | ICD-10-CM | POA: Diagnosis not present

## 2017-02-27 DIAGNOSIS — J3089 Other allergic rhinitis: Secondary | ICD-10-CM | POA: Diagnosis not present

## 2017-02-27 DIAGNOSIS — Z1283 Encounter for screening for malignant neoplasm of skin: Secondary | ICD-10-CM | POA: Diagnosis not present

## 2017-02-27 DIAGNOSIS — D225 Melanocytic nevi of trunk: Secondary | ICD-10-CM | POA: Diagnosis not present

## 2017-02-27 DIAGNOSIS — J301 Allergic rhinitis due to pollen: Secondary | ICD-10-CM | POA: Diagnosis not present

## 2017-03-11 DIAGNOSIS — J301 Allergic rhinitis due to pollen: Secondary | ICD-10-CM | POA: Diagnosis not present

## 2017-03-11 DIAGNOSIS — J3089 Other allergic rhinitis: Secondary | ICD-10-CM | POA: Diagnosis not present

## 2017-03-24 DIAGNOSIS — Z23 Encounter for immunization: Secondary | ICD-10-CM | POA: Diagnosis not present

## 2017-03-25 DIAGNOSIS — J3089 Other allergic rhinitis: Secondary | ICD-10-CM | POA: Diagnosis not present

## 2017-03-25 DIAGNOSIS — J301 Allergic rhinitis due to pollen: Secondary | ICD-10-CM | POA: Diagnosis not present

## 2017-04-16 DIAGNOSIS — J3089 Other allergic rhinitis: Secondary | ICD-10-CM | POA: Diagnosis not present

## 2017-04-16 DIAGNOSIS — J301 Allergic rhinitis due to pollen: Secondary | ICD-10-CM | POA: Diagnosis not present

## 2017-04-21 DIAGNOSIS — J3089 Other allergic rhinitis: Secondary | ICD-10-CM | POA: Diagnosis not present

## 2017-04-21 DIAGNOSIS — J301 Allergic rhinitis due to pollen: Secondary | ICD-10-CM | POA: Diagnosis not present

## 2017-04-22 ENCOUNTER — Ambulatory Visit
Admission: RE | Admit: 2017-04-22 | Discharge: 2017-04-22 | Disposition: A | Discharge status: Home / Self Care | Payer: Medicare Other | Source: Ambulatory Visit | Attending: Internal Medicine | Admitting: Internal Medicine

## 2017-04-22 DIAGNOSIS — E041 Nontoxic single thyroid nodule: Secondary | ICD-10-CM

## 2017-04-30 DIAGNOSIS — E041 Nontoxic single thyroid nodule: Secondary | ICD-10-CM | POA: Diagnosis not present

## 2017-04-30 DIAGNOSIS — E039 Hypothyroidism, unspecified: Secondary | ICD-10-CM | POA: Diagnosis not present

## 2017-04-30 DIAGNOSIS — I10 Essential (primary) hypertension: Secondary | ICD-10-CM | POA: Diagnosis not present

## 2017-04-30 DIAGNOSIS — R1310 Dysphagia, unspecified: Secondary | ICD-10-CM | POA: Diagnosis not present

## 2017-04-30 DIAGNOSIS — R490 Dysphonia: Secondary | ICD-10-CM | POA: Diagnosis not present

## 2017-05-04 DIAGNOSIS — J3089 Other allergic rhinitis: Secondary | ICD-10-CM | POA: Diagnosis not present

## 2017-05-04 DIAGNOSIS — J301 Allergic rhinitis due to pollen: Secondary | ICD-10-CM | POA: Diagnosis not present

## 2017-05-18 DIAGNOSIS — E7849 Other hyperlipidemia: Secondary | ICD-10-CM | POA: Diagnosis not present

## 2017-05-18 DIAGNOSIS — Z23 Encounter for immunization: Secondary | ICD-10-CM | POA: Diagnosis not present

## 2017-05-18 DIAGNOSIS — E039 Hypothyroidism, unspecified: Secondary | ICD-10-CM | POA: Diagnosis not present

## 2017-05-18 DIAGNOSIS — J45909 Unspecified asthma, uncomplicated: Secondary | ICD-10-CM | POA: Diagnosis not present

## 2017-05-18 DIAGNOSIS — Z6835 Body mass index (BMI) 35.0-35.9, adult: Secondary | ICD-10-CM | POA: Diagnosis not present

## 2017-05-18 DIAGNOSIS — E669 Obesity, unspecified: Secondary | ICD-10-CM | POA: Diagnosis not present

## 2017-05-18 DIAGNOSIS — R7303 Prediabetes: Secondary | ICD-10-CM | POA: Diagnosis not present

## 2017-05-18 DIAGNOSIS — I10 Essential (primary) hypertension: Secondary | ICD-10-CM | POA: Diagnosis not present

## 2017-05-18 DIAGNOSIS — E041 Nontoxic single thyroid nodule: Secondary | ICD-10-CM | POA: Diagnosis not present

## 2017-05-18 DIAGNOSIS — E781 Pure hyperglyceridemia: Secondary | ICD-10-CM | POA: Diagnosis not present

## 2017-05-22 DIAGNOSIS — J301 Allergic rhinitis due to pollen: Secondary | ICD-10-CM | POA: Diagnosis not present

## 2017-05-22 DIAGNOSIS — J3089 Other allergic rhinitis: Secondary | ICD-10-CM | POA: Diagnosis not present

## 2017-05-28 DIAGNOSIS — J4540 Moderate persistent asthma, uncomplicated: Secondary | ICD-10-CM | POA: Diagnosis not present

## 2017-05-28 DIAGNOSIS — J3089 Other allergic rhinitis: Secondary | ICD-10-CM | POA: Diagnosis not present

## 2017-05-28 DIAGNOSIS — J301 Allergic rhinitis due to pollen: Secondary | ICD-10-CM | POA: Diagnosis not present

## 2017-06-01 ENCOUNTER — Ambulatory Visit: Payer: Self-pay | Admitting: Surgery

## 2017-06-01 DIAGNOSIS — E041 Nontoxic single thyroid nodule: Secondary | ICD-10-CM | POA: Diagnosis not present

## 2017-06-01 DIAGNOSIS — E039 Hypothyroidism, unspecified: Secondary | ICD-10-CM | POA: Diagnosis not present

## 2017-06-09 DIAGNOSIS — J3089 Other allergic rhinitis: Secondary | ICD-10-CM | POA: Diagnosis not present

## 2017-06-09 DIAGNOSIS — J301 Allergic rhinitis due to pollen: Secondary | ICD-10-CM | POA: Diagnosis not present

## 2017-06-16 NOTE — Patient Instructions (Signed)
Becky Davis  06/16/2017   Your procedure is scheduled on: 06-25-17  Report to [facility] Main  Entrance Take [place]  elevators to 3rd floor to  [place] at    1030 AM.    Call this number if you have problems the morning of surgery [phone]    Remember: ONLY 1 PERSON MAY GO WITH YOU TO SHORT STAY TO GET  READY MORNING OF [place].  Do not eat food or drink liquids :After Midnight.     Take these medicines the morning of surgery with A SIP OF WATER: paxil, levothyroxine, abilify, inhalers and bring                                You may not have any metal on your body including hair pins and              piercings  Do not wear jewelry, make-up, lotions, powders or perfumes, deodorant             Do not wear nail polish.  Do not shave  48 hours prior to surgery.                Do not bring valuables to the hospital. [place].  Contacts, dentures or bridgework may not be worn into surgery.  Leave suitcase in the car. After surgery it may be brought to your room.                Please read over the following fact sheets you were given: _____________________________________________________________________           [facility] - Preparing for Surgery Before surgery, you can play an important role.  Because skin is not sterile, your skin needs to be as free of germs as possible.  You can reduce the number of germs on your skin by washing with CHG (chlorahexidine gluconate) soap before surgery.  CHG is an antiseptic cleaner which kills germs and bonds with the skin to continue killing germs even after washing. Please DO NOT use if you have an allergy to CHG or antibacterial soaps.  If your skin becomes reddened/irritated stop using the CHG and inform your nurse when you arrive at Short Stay. Do not shave (including legs and underarms) for at least 48 hours prior to the first CHG shower.  You may  shave your face/neck. Please follow these instructions carefully:  1.  Shower with CHG Soap the night before surgery and the  morning of Surgery.  2.  If you choose to wash your hair, wash your hair first as usual with your  normal  shampoo.  3.  After you shampoo, rinse your hair and body thoroughly to remove the  shampoo.                           4.  Use CHG as you would any other liquid soap.  You can apply chg directly  to the skin and wash                       Gently with a scrungie or clean washcloth.  5.  Apply the CHG  Soap to your body ONLY FROM THE NECK DOWN.   Do not use on face/ open                           Wound or open sores. Avoid contact with eyes, ears mouth and genitals (private parts).                       Wash face,  Genitals (private parts) with your normal soap.             6.  Wash thoroughly, paying special attention to the area where your surgery  will be performed.  7.  Thoroughly rinse your body with warm water from the neck down.  8.  DO NOT shower/wash with your normal soap after using and rinsing off  the CHG Soap.                9.  Pat yourself dry with a clean towel.            10.  Wear clean pajamas.            11.  Place clean sheets on your bed the night of your first shower and do not  sleep with pets. Day of Surgery : Do not apply any lotions/deodorants the morning of surgery.  Please wear clean clothes to the hospital/surgery center.  FAILURE TO FOLLOW THESE INSTRUCTIONS MAY RESULT IN THE CANCELLATION OF YOUR SURGERY PATIENT SIGNATURE_________________________________  NURSE SIGNATURE__________________________________  ________________________________________________________________________

## 2017-06-18 ENCOUNTER — Other Ambulatory Visit: Payer: Self-pay

## 2017-06-18 ENCOUNTER — Ambulatory Visit (HOSPITAL_COMMUNITY)
Admission: RE | Admit: 2017-06-18 | Discharge: 2017-06-18 | Disposition: A | Discharge status: Home / Self Care | Payer: Medicare Other | Source: Ambulatory Visit | Attending: Anesthesiology | Admitting: Anesthesiology

## 2017-06-18 ENCOUNTER — Encounter (HOSPITAL_COMMUNITY): Payer: Self-pay

## 2017-06-18 ENCOUNTER — Encounter (HOSPITAL_COMMUNITY)
Admission: RE | Admit: 2017-06-18 | Discharge: 2017-06-18 | Disposition: A | Discharge status: Home / Self Care | Payer: Medicare Other | Source: Ambulatory Visit | Attending: Surgery | Admitting: Surgery

## 2017-06-18 DIAGNOSIS — R05 Cough: Secondary | ICD-10-CM | POA: Diagnosis not present

## 2017-06-18 DIAGNOSIS — E781 Pure hyperglyceridemia: Secondary | ICD-10-CM | POA: Diagnosis not present

## 2017-06-18 DIAGNOSIS — E039 Hypothyroidism, unspecified: Secondary | ICD-10-CM | POA: Diagnosis not present

## 2017-06-18 DIAGNOSIS — E669 Obesity, unspecified: Secondary | ICD-10-CM | POA: Diagnosis not present

## 2017-06-18 DIAGNOSIS — E890 Postprocedural hypothyroidism: Secondary | ICD-10-CM | POA: Insufficient documentation

## 2017-06-18 DIAGNOSIS — Z01818 Encounter for other preprocedural examination: Secondary | ICD-10-CM

## 2017-06-18 DIAGNOSIS — E041 Nontoxic single thyroid nodule: Secondary | ICD-10-CM | POA: Diagnosis not present

## 2017-06-18 DIAGNOSIS — Z6835 Body mass index (BMI) 35.0-35.9, adult: Secondary | ICD-10-CM | POA: Diagnosis not present

## 2017-06-18 DIAGNOSIS — R7303 Prediabetes: Secondary | ICD-10-CM | POA: Diagnosis not present

## 2017-06-18 DIAGNOSIS — I10 Essential (primary) hypertension: Secondary | ICD-10-CM | POA: Diagnosis not present

## 2017-06-18 DIAGNOSIS — J45909 Unspecified asthma, uncomplicated: Secondary | ICD-10-CM | POA: Diagnosis not present

## 2017-06-18 DIAGNOSIS — E7849 Other hyperlipidemia: Secondary | ICD-10-CM | POA: Diagnosis not present

## 2017-06-18 HISTORY — DX: Nontoxic single thyroid nodule: E04.1

## 2017-06-18 HISTORY — DX: Pneumonia, unspecified organism: J18.9

## 2017-06-18 HISTORY — DX: Headache: R51

## 2017-06-18 HISTORY — DX: Unspecified osteoarthritis, unspecified site: M19.90

## 2017-06-18 HISTORY — DX: Cardiac murmur, unspecified: R01.1

## 2017-06-18 HISTORY — DX: Prediabetes: R73.03

## 2017-06-18 HISTORY — DX: Essential (primary) hypertension: I10

## 2017-06-18 HISTORY — DX: Unspecified asthma, uncomplicated: J45.909

## 2017-06-18 HISTORY — DX: Headache, unspecified: R51.9

## 2017-06-18 HISTORY — DX: Hypothyroidism, unspecified: E03.9

## 2017-06-18 LAB — BASIC METABOLIC PANEL
ANION GAP: 10 (ref 5–15)
BUN: 15 mg/dL (ref 6–20)
CALCIUM: 9.2 mg/dL (ref 8.9–10.3)
CO2: 27 mmol/L (ref 22–32)
Chloride: 99 mmol/L — ABNORMAL LOW (ref 101–111)
Creatinine, Ser: 0.63 mg/dL (ref 0.44–1.00)
GFR calc Af Amer: >60 mL/min (ref >60)
GLUCOSE: 113 mg/dL — AB (ref 65–99)
Potassium: 3.4 mmol/L — ABNORMAL LOW (ref 3.5–5.1)
SODIUM: 136 mmol/L (ref 135–145)

## 2017-06-18 LAB — CBC
HCT: 38.3 % (ref 36.0–46.0)
HEMOGLOBIN: 13.1 g/dL (ref 12.0–15.0)
MCH: 29.5 pg (ref 26.0–34.0)
MCHC: 34.2 g/dL (ref 30.0–36.0)
MCV: 86.3 fL (ref 78.0–100.0)
Platelets: 286 10*3/uL (ref 150–400)
RBC: 4.44 MIL/uL (ref 3.87–5.11)
RDW: 12.9 % (ref 11.5–15.5)
WBC: 8.6 10*3/uL (ref 4.0–10.5)

## 2017-06-22 NOTE — Progress Notes (Signed)
hemagllobin a1c dr Jacelyn Grip 05-18-17 dr Jacelyn Grip on chart

## 2017-06-25 ENCOUNTER — Encounter (HOSPITAL_COMMUNITY)
Admission: RE | Disposition: A | Discharge status: Home / Self Care | Payer: Self-pay | Source: Ambulatory Visit | Attending: Surgery

## 2017-06-25 ENCOUNTER — Ambulatory Visit (HOSPITAL_COMMUNITY): Payer: Medicare Other | Admitting: Anesthesiology

## 2017-06-25 ENCOUNTER — Observation Stay (HOSPITAL_COMMUNITY)
Admission: RE | Admit: 2017-06-25 | Discharge: 2017-06-26 | Disposition: A | Discharge status: Home / Self Care | Payer: Medicare Other | Source: Ambulatory Visit | Attending: Surgery | Admitting: Surgery

## 2017-06-25 ENCOUNTER — Other Ambulatory Visit: Payer: Self-pay

## 2017-06-25 ENCOUNTER — Encounter (HOSPITAL_COMMUNITY): Payer: Self-pay | Admitting: Emergency Medicine

## 2017-06-25 DIAGNOSIS — E041 Nontoxic single thyroid nodule: Secondary | ICD-10-CM | POA: Diagnosis present

## 2017-06-25 DIAGNOSIS — I10 Essential (primary) hypertension: Secondary | ICD-10-CM | POA: Diagnosis not present

## 2017-06-25 DIAGNOSIS — E039 Hypothyroidism, unspecified: Secondary | ICD-10-CM | POA: Insufficient documentation

## 2017-06-25 DIAGNOSIS — Z880 Allergy status to penicillin: Secondary | ICD-10-CM | POA: Insufficient documentation

## 2017-06-25 DIAGNOSIS — Z6834 Body mass index (BMI) 34.0-34.9, adult: Secondary | ICD-10-CM | POA: Diagnosis not present

## 2017-06-25 DIAGNOSIS — J449 Chronic obstructive pulmonary disease, unspecified: Secondary | ICD-10-CM | POA: Insufficient documentation

## 2017-06-25 DIAGNOSIS — Z79899 Other long term (current) drug therapy: Secondary | ICD-10-CM | POA: Insufficient documentation

## 2017-06-25 DIAGNOSIS — Z91048 Other nonmedicinal substance allergy status: Secondary | ICD-10-CM | POA: Diagnosis not present

## 2017-06-25 DIAGNOSIS — M1990 Unspecified osteoarthritis, unspecified site: Secondary | ICD-10-CM | POA: Diagnosis not present

## 2017-06-25 DIAGNOSIS — Z888 Allergy status to other drugs, medicaments and biological substances status: Secondary | ICD-10-CM | POA: Insufficient documentation

## 2017-06-25 DIAGNOSIS — D34 Benign neoplasm of thyroid gland: Principal | ICD-10-CM | POA: Insufficient documentation

## 2017-06-25 HISTORY — PX: THYROID LOBECTOMY: SHX420

## 2017-06-25 SURGERY — LOBECTOMY, THYROID
Anesthesia: General | Laterality: Left

## 2017-06-25 MED ORDER — GLYCOPYRROLATE 0.2 MG/ML IV SOSY
PREFILLED_SYRINGE | INTRAVENOUS | Status: AC
  Filled 2017-06-25: qty 5

## 2017-06-25 MED ORDER — MIDAZOLAM HCL 2 MG/2ML IJ SOLN: 0.5000 mg | Freq: Once | INTRAMUSCULAR | Status: DC | PRN

## 2017-06-25 MED ORDER — LIDOCAINE 2% (20 MG/ML) 5 ML SYRINGE
INTRAMUSCULAR | Status: AC
  Filled 2017-06-25: qty 5

## 2017-06-25 MED ORDER — PROPOFOL 10 MG/ML IV BOLUS
INTRAVENOUS | Status: DC | PRN
  Administered 2017-06-25: 50 mg via INTRAVENOUS
  Administered 2017-06-25: 150 mg via INTRAVENOUS

## 2017-06-25 MED ORDER — MEPERIDINE HCL 50 MG/ML IJ SOLN: 6.2500 mg | INTRAMUSCULAR | Status: DC | PRN

## 2017-06-25 MED ORDER — PAROXETINE HCL 20 MG PO TABS
20.0000 mg | ORAL_TABLET | Freq: Every day | ORAL | Status: DC
  Administered 2017-06-26: 20 mg via ORAL
  Filled 2017-06-25: qty 1

## 2017-06-25 MED ORDER — TRAMADOL HCL 50 MG PO TABS
50.0000 mg | ORAL_TABLET | Freq: Four times a day (QID) | ORAL | Status: DC | PRN
  Administered 2017-06-25 – 2017-06-26 (×2): 50 mg via ORAL
  Filled 2017-06-25 (×2): qty 1

## 2017-06-25 MED ORDER — FENTANYL CITRATE (PF) 100 MCG/2ML IJ SOLN: 25.0000 ug | INTRAMUSCULAR | Status: DC | PRN

## 2017-06-25 MED ORDER — ALBUTEROL SULFATE (2.5 MG/3ML) 0.083% IN NEBU: 2.5000 mg | INHALATION_SOLUTION | RESPIRATORY_TRACT | Status: DC | PRN

## 2017-06-25 MED ORDER — ALBUTEROL SULFATE HFA 108 (90 BASE) MCG/ACT IN AERS
INHALATION_SPRAY | RESPIRATORY_TRACT | Status: AC
  Filled 2017-06-25: qty 6.7

## 2017-06-25 MED ORDER — FENTANYL CITRATE (PF) 100 MCG/2ML IJ SOLN
INTRAMUSCULAR | Status: DC | PRN
  Administered 2017-06-25 (×3): 50 ug via INTRAVENOUS

## 2017-06-25 MED ORDER — CHLORHEXIDINE GLUCONATE CLOTH 2 % EX PADS: 6.0000 | MEDICATED_PAD | Freq: Once | CUTANEOUS | Status: DC

## 2017-06-25 MED ORDER — SUCCINYLCHOLINE CHLORIDE 200 MG/10ML IV SOSY
PREFILLED_SYRINGE | INTRAVENOUS | Status: AC
  Filled 2017-06-25: qty 10

## 2017-06-25 MED ORDER — DEXAMETHASONE SODIUM PHOSPHATE 10 MG/ML IJ SOLN
INTRAMUSCULAR | Status: AC
  Filled 2017-06-25: qty 1

## 2017-06-25 MED ORDER — SUCCINYLCHOLINE CHLORIDE 20 MG/ML IJ SOLN
INTRAMUSCULAR | Status: DC | PRN
  Administered 2017-06-25: 100 mg via INTRAVENOUS

## 2017-06-25 MED ORDER — KCL IN DEXTROSE-NACL 20-5-0.45 MEQ/L-%-% IV SOLN
INTRAVENOUS | Status: DC
  Administered 2017-06-25: 18:00:00 via INTRAVENOUS
  Filled 2017-06-25 (×2): qty 1000

## 2017-06-25 MED ORDER — MONTELUKAST SODIUM 10 MG PO TABS
10.0000 mg | ORAL_TABLET | Freq: Every day | ORAL | Status: DC
  Administered 2017-06-25: 10 mg via ORAL
  Filled 2017-06-25: qty 1

## 2017-06-25 MED ORDER — MOMETASONE FURO-FORMOTEROL FUM 200-5 MCG/ACT IN AERO
2.0000 | INHALATION_SPRAY | Freq: Two times a day (BID) | RESPIRATORY_TRACT | Status: DC
  Administered 2017-06-25 – 2017-06-26 (×2): 2 via RESPIRATORY_TRACT
  Filled 2017-06-25: qty 8.8

## 2017-06-25 MED ORDER — LIDOCAINE 2% (20 MG/ML) 5 ML SYRINGE
INTRAMUSCULAR | Status: DC | PRN
  Administered 2017-06-25: 100 mg via INTRAVENOUS

## 2017-06-25 MED ORDER — CEFAZOLIN SODIUM-DEXTROSE 2-4 GM/100ML-% IV SOLN
2.0000 g | INTRAVENOUS | Status: AC
  Administered 2017-06-25: 2 g via INTRAVENOUS
  Filled 2017-06-25: qty 100

## 2017-06-25 MED ORDER — SUGAMMADEX SODIUM 200 MG/2ML IV SOLN
INTRAVENOUS | Status: DC | PRN
  Administered 2017-06-25: 200 mg via INTRAVENOUS

## 2017-06-25 MED ORDER — LEVOTHYROXINE SODIUM 100 MCG PO TABS
100.0000 ug | ORAL_TABLET | Freq: Every day | ORAL | Status: DC
  Administered 2017-06-26: 100 ug via ORAL
  Filled 2017-06-25: qty 1

## 2017-06-25 MED ORDER — ONDANSETRON HCL 4 MG/2ML IJ SOLN
INTRAMUSCULAR | Status: AC
  Filled 2017-06-25: qty 2

## 2017-06-25 MED ORDER — ALBUTEROL SULFATE HFA 108 (90 BASE) MCG/ACT IN AERS
INHALATION_SPRAY | RESPIRATORY_TRACT | Status: DC | PRN
  Administered 2017-06-25: 2 via RESPIRATORY_TRACT

## 2017-06-25 MED ORDER — HYDROMORPHONE HCL 1 MG/ML IJ SOLN: 1.0000 mg | INTRAMUSCULAR | Status: DC | PRN

## 2017-06-25 MED ORDER — PHENYLEPHRINE HCL 10 MG/ML IJ SOLN
INTRAMUSCULAR | Status: DC | PRN
  Administered 2017-06-25: 80 ug via INTRAVENOUS
  Administered 2017-06-25: 120 ug via INTRAVENOUS

## 2017-06-25 MED ORDER — FENTANYL CITRATE (PF) 100 MCG/2ML IJ SOLN
INTRAMUSCULAR | Status: AC
  Filled 2017-06-25: qty 2

## 2017-06-25 MED ORDER — PROPOFOL 10 MG/ML IV BOLUS
INTRAVENOUS | Status: AC
  Filled 2017-06-25: qty 20

## 2017-06-25 MED ORDER — SUGAMMADEX SODIUM 500 MG/5ML IV SOLN
INTRAVENOUS | Status: AC
  Filled 2017-06-25: qty 5

## 2017-06-25 MED ORDER — ALBUTEROL SULFATE HFA 108 (90 BASE) MCG/ACT IN AERS: 2.0000 | INHALATION_SPRAY | RESPIRATORY_TRACT | Status: DC | PRN

## 2017-06-25 MED ORDER — HYDROCODONE-ACETAMINOPHEN 5-325 MG PO TABS: 1.0000 | ORAL_TABLET | ORAL | Status: DC | PRN

## 2017-06-25 MED ORDER — ONDANSETRON HCL 4 MG/2ML IJ SOLN: 4.0000 mg | Freq: Four times a day (QID) | INTRAMUSCULAR | Status: DC | PRN

## 2017-06-25 MED ORDER — ROCURONIUM BROMIDE 100 MG/10ML IV SOLN
INTRAVENOUS | Status: DC | PRN
  Administered 2017-06-25: 50 mg via INTRAVENOUS

## 2017-06-25 MED ORDER — PROMETHAZINE HCL 25 MG/ML IJ SOLN: 6.2500 mg | INTRAMUSCULAR | Status: DC | PRN

## 2017-06-25 MED ORDER — ARIPIPRAZOLE 10 MG PO TABS
10.0000 mg | ORAL_TABLET | Freq: Every day | ORAL | Status: DC
  Administered 2017-06-26: 10 mg via ORAL
  Filled 2017-06-25: qty 1

## 2017-06-25 MED ORDER — DEXAMETHASONE SODIUM PHOSPHATE 4 MG/ML IJ SOLN
INTRAMUSCULAR | Status: DC | PRN
  Administered 2017-06-25: 10 mg via INTRAVENOUS

## 2017-06-25 MED ORDER — LACTATED RINGERS IV SOLN
INTRAVENOUS | Status: DC
  Administered 2017-06-25: 11:00:00 via INTRAVENOUS

## 2017-06-25 MED ORDER — ACETAMINOPHEN 650 MG RE SUPP: 650.0000 mg | Freq: Four times a day (QID) | RECTAL | Status: DC | PRN

## 2017-06-25 MED ORDER — ROCURONIUM BROMIDE 50 MG/5ML IV SOSY
PREFILLED_SYRINGE | INTRAVENOUS | Status: AC
  Filled 2017-06-25: qty 5

## 2017-06-25 MED ORDER — ONDANSETRON HCL 4 MG/2ML IJ SOLN
INTRAMUSCULAR | Status: DC | PRN
  Administered 2017-06-25: 4 mg via INTRAVENOUS

## 2017-06-25 MED ORDER — HYDROCHLOROTHIAZIDE 25 MG PO TABS
25.0000 mg | ORAL_TABLET | Freq: Every day | ORAL | Status: DC
  Administered 2017-06-25 – 2017-06-26 (×2): 25 mg via ORAL
  Filled 2017-06-25 (×2): qty 1

## 2017-06-25 MED ORDER — ONDANSETRON 4 MG PO TBDP: 4.0000 mg | ORAL_TABLET | Freq: Four times a day (QID) | ORAL | Status: DC | PRN

## 2017-06-25 MED ORDER — ACETAMINOPHEN 325 MG PO TABS
650.0000 mg | ORAL_TABLET | Freq: Four times a day (QID) | ORAL | Status: DC | PRN
  Administered 2017-06-25 – 2017-06-26 (×2): 650 mg via ORAL
  Filled 2017-06-25 (×2): qty 2

## 2017-06-25 SURGICAL SUPPLY — 32 items
ATTRACTOMAT 16X20 MAGNETIC DRP (DRAPES) ×2 IMPLANT
BLADE SURG 15 STRL LF DISP TIS (BLADE) ×1 IMPLANT
BLADE SURG 15 STRL SS (BLADE) ×2
CHLORAPREP W/TINT 26ML (MISCELLANEOUS) ×3 IMPLANT
CLIP VESOCCLUDE MED 6/CT (CLIP) ×4 IMPLANT
CLIP VESOCCLUDE SM WIDE 6/CT (CLIP) ×4 IMPLANT
DISSECTOR ROUND CHERRY 3/8 STR (MISCELLANEOUS) IMPLANT
DRAPE LAPAROTOMY T 98X78 PEDS (DRAPES) ×2 IMPLANT
ELECT PENCIL ROCKER SW 15FT (MISCELLANEOUS) ×2 IMPLANT
ELECT REM PT RETURN 15FT ADLT (MISCELLANEOUS) ×2 IMPLANT
GAUZE SPONGE 4X4 12PLY STRL (GAUZE/BANDAGES/DRESSINGS) IMPLANT
GAUZE SPONGE 4X4 16PLY XRAY LF (GAUZE/BANDAGES/DRESSINGS) ×2 IMPLANT
GLOVE SURG ORTHO 8.0 STRL STRW (GLOVE) ×12 IMPLANT
GOWN STRL REUS W/TWL XL LVL3 (GOWN DISPOSABLE) ×6 IMPLANT
HEMOSTAT SURGICEL 2X4 FIBR (HEMOSTASIS) ×2 IMPLANT
ILLUMINATOR WAVEGUIDE N/F (MISCELLANEOUS) ×1 IMPLANT
KIT BASIN OR (CUSTOM PROCEDURE TRAY) ×2 IMPLANT
LIGHT WAVEGUIDE WIDE FLAT (MISCELLANEOUS) IMPLANT
PACK BASIC VI WITH GOWN DISP (CUSTOM PROCEDURE TRAY) ×2 IMPLANT
SHEARS HARMONIC 9CM CVD (BLADE) ×2 IMPLANT
STAPLER VISISTAT 35W (STAPLE) ×2 IMPLANT
STRIP CLOSURE SKIN 1/2X4 (GAUZE/BANDAGES/DRESSINGS) ×2 IMPLANT
SUT MNCRL AB 4-0 PS2 18 (SUTURE) ×2 IMPLANT
SUT SILK 2 0 (SUTURE) ×2
SUT SILK 2-0 18XBRD TIE 12 (SUTURE) ×1 IMPLANT
SUT SILK 3 0 (SUTURE)
SUT SILK 3-0 18XBRD TIE 12 (SUTURE) IMPLANT
SUT VIC AB 3-0 SH 18 (SUTURE) ×2 IMPLANT
SYR BULB IRRIGATION 50ML (SYRINGE) ×2 IMPLANT
TOWEL OR 17X26 10 PK STRL BLUE (TOWEL DISPOSABLE) ×2 IMPLANT
TOWEL OR NON WOVEN STRL DISP B (DISPOSABLE) ×2 IMPLANT
YANKAUER SUCT BULB TIP 10FT TU (MISCELLANEOUS) ×2 IMPLANT

## 2017-06-25 NOTE — Anesthesia Postprocedure Evaluation (Signed)
Anesthesia Post Note  Patient: AIDE WOJNAR  Procedure(s) Performed: LEFT THYROID LOBECTOMY (Left )     Patient location during evaluation: PACU Anesthesia Type: General Level of consciousness: awake and alert, oriented and patient cooperative Pain management: pain level controlled Vital Signs Assessment: post-procedure vital signs reviewed and stable Respiratory status: spontaneous breathing, nonlabored ventilation, respiratory function stable and patient connected to nasal cannula oxygen Cardiovascular status: blood pressure returned to baseline and stable Postop Assessment: no apparent nausea or vomiting Anesthetic complications: no    Last Vitals:  Vitals:   06/25/17 1500 06/25/17 1515  BP: (!) 182/92 (!) 171/105  Pulse: 87 86  Resp: 11 12  Temp: 36.7 C 36.8 C  SpO2: 95% 94%    Last Pain:  Vitals:   06/25/17 1500  TempSrc:   PainSc: 0-No pain                 Judy Goodenow,E. Jamarques Pinedo

## 2017-06-25 NOTE — Transfer of Care (Signed)
Immediate Anesthesia Transfer of Care Note  Patient: Becky Davis  Procedure(s) Performed: LEFT THYROID LOBECTOMY (Left )  Patient Location: PACU  Anesthesia Type:General  Level of Consciousness: awake and alert   Airway & Oxygen Therapy: Patient Spontanous Breathing and Patient connected to face mask oxygen  Post-op Assessment: Report given to RN and Post -op Vital signs reviewed and stable  Post vital signs: Reviewed and stable  Last Vitals:  Vitals:   06/25/17 1022  BP: (!) 158/79  Pulse: 88  Resp: 18  Temp: 36.8 C  SpO2: 95%    Last Pain:  Vitals:   06/25/17 1022  TempSrc: Oral      Patients Stated Pain Goal: 4 (88/11/03 1594)  Complications: No apparent anesthesia complications

## 2017-06-25 NOTE — H&P (Signed)
General Surgery [facility]  Becky Davis DOB: 10-23-1950 Married / Language: English / Race: White Female  History of Present Illness  The patient is a 66 year old female who presents with a thyroid nodule.  CC: left thyroid nodule  Patient is referred by Dr. Delrae Rend for surgical evaluation and management of left thyroid nodule. Patient has a dominant nodule in the left thyroid lobe. On recent ultrasound exam it measures 4.4 cm in maximum dimension and is solid. It has had interval enlargement. Patient has undergone 2 attempts at fine-needle aspiration biopsy, both of which returned insufficient material for diagnosis. Patient has developed some mild compressive symptoms including dysphagia and alteration in voice quality. She has difficulty swallowing large pills. She presents today to discuss left thyroid lobectomy for definitive diagnosis and for relief of compressive symptoms. 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 Patient does have hypothyroidism and currently takes levothyroxine 100 g daily. Her most recent TSH level was normal at 1.68.   Past Surgical History Colon Polyp Removal - Colonoscopy  Thyroid Surgery   Diagnostic Studies History  Mammogram  1-3 years ago Pap Smear  1-5 years ago  Allergies Penicillins  Rash. Allergies Reconciled   Medication History  Albuterol Sulfate ((2.5 MG/3ML)0.083% Nebulized Soln, Inhalation) Active. HydroCHLOROthiazide (25MG  Tablet, Oral) Active. Meloxicam (15MG  Tablet, Oral) Active. Montelukast Sodium (10MG  Tablet, Oral) Active. Symbicort (80-4.5MCG/ACT Aerosol, Inhalation) Active. Levothyroxine Sodium (100MCG Tablet, Oral) Active. Abilify (10MG  Tablet, Oral) Active. PARoxetine HCl (20MG  Tablet, Oral) Active. Medications Reconciled  Social History  Caffeine use   Carbonated beverages, Coffee, Tea. No alcohol use  No drug use  Tobacco use  Never smoker.  Family History  Arthritis  Mother, Sister. Bleeding disorder  Mother. Cancer  Brother, Mother, Sister, Son. Depression  Brother, Mother. Diabetes Mellitus  Mother. Heart Disease  Mother. Hypertension  Mother. Melanoma  Sister. Migraine Headache  Daughter, Mother. Respiratory Condition  Mother.  Pregnancy / Birth History Age at menarche  19 years. Age of menopause  24-55 Gravida  3 Irregular periods  Length (months) of breastfeeding  7-12 Maternal age  43-25 Para  3  Other Problems Arthritis  Asthma  Bladder Problems  High blood pressure  Thyroid Disease     Review of Systems General Not Present- Appetite Loss, Chills, Fatigue, Fever, Night Sweats, Weight Gain and Weight Loss. Skin Not Present- Change in Wart/Mole, Dryness, Hives, Jaundice, New Lesions, Non-Healing Wounds, Rash and Ulcer. HEENT Present- Hearing Loss, Seasonal Allergies and Wears glasses/contact lenses. Not Present- Earache, Hoarseness, Nose Bleed, Oral Ulcers, Ringing in the Ears, Sinus Pain, Sore Throat, Visual Disturbances and Yellow Eyes. Respiratory Present- Chronic Cough and Wheezing. Not Present- Bloody sputum, Difficulty Breathing and Snoring. Breast Not Present- Breast Mass, Breast Pain, Nipple Discharge and Skin Changes. Cardiovascular Not Present- Chest Pain, Difficulty Breathing Lying Down, Leg Cramps, Palpitations, Rapid Heart Rate, Shortness of Breath and Swelling of Extremities. Gastrointestinal Not Present- Abdominal Pain, Bloating, Bloody Stool, Change in Bowel Habits, Chronic diarrhea, Constipation, Difficulty Swallowing, Excessive gas, Gets full quickly at meals, Hemorrhoids, Indigestion, Nausea, Rectal Pain and Vomiting. Female Genitourinary Present- Frequency and Urgency. Not Present- Nocturia, Painful Urination and Pelvic Pain. Musculoskeletal Present- Back Pain, Joint  Pain, Muscle Pain and Muscle Weakness. Not Present- Joint Stiffness and Swelling of Extremities. Neurological Present- Trouble walking and Weakness. Not Present- Decreased Memory, Fainting, Headaches, Numbness, Seizures, Tingling and  Tremor. Psychiatric Not Present- Anxiety, Bipolar, Change in Sleep Pattern, Depression, Fearful and Frequent crying. Endocrine Not Present- Cold Intolerance, Excessive Hunger, Hair Changes, Heat Intolerance, Hot flashes and New Diabetes. Hematology Not Present- Blood Thinners, Easy Bruising, Excessive bleeding, Gland problems, HIV and Persistent Infections.  Vitals  Weight: 199.8 lb Height: 63.5in Body Surface Area: 1.94 m Body Mass Index: 34.84 kg/m  Temp.: 98.63F(Temporal)  Pulse: 69 (Regular)  BP: 122/78 (Sitting, Left Arm, Standard)   Physical Exam  See vital signs recorded above  GENERAL APPEARANCE Development: normal Nutritional status: normal Gross deformities: none  SKIN Rash, lesions, ulcers: none Induration, erythema: none Nodules: none palpable  EYES Conjunctiva and lids: normal Pupils: equal and reactive Iris: normal bilaterally  EARS, NOSE, MOUTH, THROAT External ears: no lesion or deformity External nose: no lesion or deformity Hearing: grossly normal Lips: no lesion or deformity Dentition: normal for age Oral mucosa: moist  NECK Symmetric: yes Trachea: midline Thyroid: Soft mass left thyroid lobe, approximately 4 cm in size extending beneath the left clavicle, mobile with swallowing, nontender Well-healed surgical incision high anterior neck consistent with thyroglossal duct cyst excision  CHEST Respiratory effort: normal Retraction or accessory muscle use: no Breath sounds: normal bilaterally Rales, rhonchi, wheeze: none  CARDIOVASCULAR Auscultation: regular rhythm, normal rate Murmurs: none Pulses: carotid and radial pulse 2+ palpable Lower extremity edema: none Lower extremity varicosities:  none  MUSCULOSKELETAL Station and gait: normal Digits and nails: no clubbing or cyanosis Muscle strength: grossly normal all extremities Range of motion: grossly normal all extremities Deformity: none  LYMPHATIC Cervical: none palpable Supraclavicular: none palpable  PSYCHIATRIC Oriented to person, place, and time: yes Mood and affect: normal for situation Judgment and insight: appropriate for situation    Assessment & Plan  LEFT THYROID NODULE (E04.1) HYPOTHYROIDISM, ADULT (E03.9)  Pt Education - Pamphlet Given - The Thyroid Book: discussed with patient and provided information. Patient presents today on referral from her endocrinologist for evaluation and management of dominant left thyroid mass with compressive symptoms. Patient is provided with written literature on thyroid surgery to review at home.  I have recommended proceeding with left thyroid lobectomy. We discussed the procedure. We discussed the location of the surgical incision. We discussed the hospital stay to be anticipated. We discussed her postoperative recovery. She would continue on her current dosage of thyroid hormone replacement. We discussed the potential for recurrent laryngeal nerve injury and injury to parathyroid glands. This risk is approximately 2%. We also discussed the possibility for the need of additional surgery in the event of malignancy. Patient understands and wishes to proceed with surgery in the near future.  The risks and benefits of the procedure have been discussed at length with the patient. The patient understands the proposed procedure, potential alternative treatments, and the course of recovery to be expected. All of the patient's questions have been answered at this time. The patient wishes to proceed with surgery.  Armandina Gemma, [facility] Office: [phone]

## 2017-06-25 NOTE — Op Note (Signed)
Procedure Note  Pre-operative Diagnosis:  Left thyroid nodule  Post-operative Diagnosis:  same  Surgeon:  Armandina Gemma, MD  Assistant:  none   Procedure:  Left thyroid lobectomy  Anesthesia:  General  Estimated Blood Loss:  minimal  Drains: none         Specimen: thyroid lobe to pathology  Indications:  Patient is referred by Dr. Delrae Rend for surgical evaluation and management of left thyroid nodule. Patient has a dominant nodule in the left thyroid lobe. On recent ultrasound exam it measures 4.4 cm in maximum dimension and is solid. It has had interval enlargement. Patient has undergone 2 attempts at fine-needle aspiration biopsy, both of which returned insufficient material for diagnosis. Patient has developed some mild compressive symptoms including dysphagia and alteration in voice quality. She has difficulty swallowing large pills. She presents today to discuss left thyroid lobectomy for definitive diagnosis and for relief of compressive symptoms.   Procedure Details: Procedure was done in OR #5 at the [facility].  The patient was brought to the operating room and placed in a supine position on the operating room table.  Following administration of general anesthesia, the patient was positioned and then prepped and draped in the usual aseptic fashion.  After ascertaining that an adequate level of anesthesia had been achieved, a Kocher incision was made with #15 blade.  Dissection was carried through subcutaneous tissues and platysma. Hemostasis was achieved with the electrocautery.  Skin flaps were elevated cephalad and caudad from the thyroid notch to the sternal notch.  A self-retaining retractor was placed for exposure.  Strap muscles were incised in the midline and dissection was begun on the left side.  Strap muscles were reflected laterally.  The left thyroid lobe was moderately enlarged with a dominant mass centrally and a smaller 1.5 cm nodule inferiorly.  The  lobe was gently mobilized with blunt dissection.  Superior pole vessels were dissected out and divided individually between small and medium Ligaclips with the Harmonic scalpel.  The thyroid lobe was rolled anteriorly.  Branches of the inferior thyroid artery were divided between small Ligaclips with the Harmonic scalpel.  Inferior venous tributaries were divided between Ligaclips.  Both the superior and inferior parathyroid glands were identified and preserved on their vascular pedicles.  The recurrent laryngeal nerve was identified and preserved along its course.  The ligament of Gwenlyn Found was released with the electrocautery and the gland was mobilized onto the anterior trachea. Isthmus was mobilized across the midline.  There was a small pyramidal lobe present which was resected with the thyroid isthmus.  The thyroid parenchyma was transected at the junction of the isthmus and contralateral thyroid lobe with the Harmonic scalpel.  The thyroid lobe and isthmus were submitted to pathology for review.  The neck was irrigated with warm saline.  Fibular was placed throughout the operative field.  Strap muscles were reapproximated in the midline with interrupted 3-0 Vicryl sutures.  Platysma was closed with interrupted 3-0 Vicryl sutures.  Skin was closed with a running 4-0 Monocryl subcuticular suture.  Wound was washed and dried and steri-strips were applied.  Dry gauze dressing was placed.  The patient was awakened from anesthesia and brought to the recovery room.  The patient tolerated the procedure well.   Armandina Gemma, MD [facility] Office: [phone]

## 2017-06-25 NOTE — Anesthesia Preprocedure Evaluation (Signed)
Anesthesia Evaluation  Patient identified by MRN, date of birth, ID band Patient awake    Reviewed: Allergy & Precautions, NPO status , Patient's Chart, lab work & pertinent test results  History of Anesthesia Complications Negative for: history of anesthetic complications  Airway Mallampati: IV  TM Distance: >3 FB Neck ROM: Full    Dental  (+) Dental Advisory Given   Pulmonary asthma , COPD,  COPD inhaler,    breath sounds clear to auscultation       Cardiovascular hypertension, Pt. on medications (-) angina Rhythm:Regular Rate:Normal     Neuro/Psych negative neurological ROS     GI/Hepatic negative GI ROS, Neg liver ROS,   Endo/Other  Hypothyroidism Morbid obesity  Renal/GU negative Renal ROS     Musculoskeletal  (+) Arthritis ,   Abdominal (+) + obese,   Peds  Hematology   Anesthesia Other Findings   Reproductive/Obstetrics                             Anesthesia Physical Anesthesia Plan  ASA: III  Anesthesia Plan: General   Post-op Pain Management:    Induction: Intravenous  PONV Risk Score and Plan: 3 and Ondansetron, Dexamethasone and Scopolamine patch - Pre-op  Airway Management Planned: Oral ETT and Video Laryngoscope Planned  Additional Equipment:   Intra-op Plan:   Post-operative Plan: Extubation in OR  Informed Consent: I have reviewed the patients History and Physical, chart, labs and discussed the procedure including the risks, benefits and alternatives for the proposed anesthesia with the patient or authorized representative who has indicated his/her understanding and acceptance.   Dental advisory given  Plan Discussed with: CRNA and Surgeon  Anesthesia Plan Comments: (Plan routine monitors, GETA with VideoGlide intubation   )        Anesthesia Quick Evaluation

## 2017-06-25 NOTE — Anesthesia Procedure Notes (Signed)
Procedure Name: Intubation Date/Time: 06/25/2017 12:32 PM Performed by: Annye Asa, MD Pre-anesthesia Checklist: Patient identified, Emergency Drugs available, Suction available and Patient being monitored Patient Re-evaluated:Patient Re-evaluated prior to induction Oxygen Delivery Method: Circle system utilized Preoxygenation: Pre-oxygenation with 100% oxygen Induction Type: IV induction Ventilation: Mask ventilation without difficulty Laryngoscope Size: Glidescope and 3 Grade View: Grade I Tube type: Oral Tube size: 7.0 mm Number of attempts: 1 Airway Equipment and Method: Stylet and Video-laryngoscopy Placement Confirmation: ETT inserted through vocal cords under direct vision,  positive ETCO2 and breath sounds checked- equal and bilateral Secured at: 22 cm Tube secured with: Tape Dental Injury: Teeth and Oropharynx as per pre-operative assessment

## 2017-06-26 ENCOUNTER — Encounter (HOSPITAL_COMMUNITY): Payer: Self-pay | Admitting: Surgery

## 2017-06-26 DIAGNOSIS — D34 Benign neoplasm of thyroid gland: Secondary | ICD-10-CM | POA: Diagnosis not present

## 2017-06-26 DIAGNOSIS — I10 Essential (primary) hypertension: Secondary | ICD-10-CM | POA: Diagnosis not present

## 2017-06-26 DIAGNOSIS — M1990 Unspecified osteoarthritis, unspecified site: Secondary | ICD-10-CM | POA: Diagnosis not present

## 2017-06-26 DIAGNOSIS — J449 Chronic obstructive pulmonary disease, unspecified: Secondary | ICD-10-CM | POA: Diagnosis not present

## 2017-06-26 DIAGNOSIS — E039 Hypothyroidism, unspecified: Secondary | ICD-10-CM | POA: Diagnosis not present

## 2017-06-26 MED ORDER — TRAMADOL HCL 50 MG PO TABS: 50.0000 mg | ORAL_TABLET | Freq: Four times a day (QID) | ORAL | 0 refills | Status: DC | PRN

## 2017-06-26 NOTE — Progress Notes (Signed)
Discharge instructions given. Pt verbalized understanding and all questions were answered.  

## 2017-06-26 NOTE — Discharge Summary (Signed)
Physician Discharge Summary [facility]  Patient ID: Becky Davis MRN: [id number] DOB/AGE: 1950/12/20 66 y.o.  Admit date: 06/25/2017 Discharge date: 06/26/2017  Admission Diagnoses:  Left thyroid nodule  Discharge Diagnoses:  Principal Problem:   Left thyroid nodule   Discharged Condition: good  Hospital Course: Patient was admitted for observation following thyroid surgery.  Post op course was uncomplicated.  Pain was well controlled.  Tolerated diet. Patient was prepared for discharge home on POD#1.  Consults: None  Treatments: surgery: left thyroid lobectomy  Discharge Exam: Blood pressure (!) 149/85, pulse 84, temperature 97.8 F (36.6 C), temperature source Oral, resp. rate 16, height 5\' 3"  (1.6 m), weight 89.4 kg (197 lb), SpO2 95 %. HEENT - clear Neck - wound dry and intact, mild ecchymosis; voice normal Chest - clear bilaterally Cor - RRR  Disposition: Home  Discharge Instructions    Apply dressing   Complete by:  As directed    Diet - low sodium heart healthy   Complete by:  As directed    Discharge instructions   Complete by:  As directed    [facility]  THYROID & PARATHYROID SURGERY:  POST-OP INSTRUCTIONS  Always review your discharge instruction sheet from the facility where your surgery was performed.  A prescription for pain medication may be given to you upon discharge.  Take your pain medication as prescribed.  If narcotic pain medicine is not needed, then you may take acetaminophen (Tylenol) or ibuprofen (Advil) as needed.  Take your usually prescribed medications unless otherwise directed.  If you need a refill on your pain medication, please contact our office during regular business hours.  Prescriptions will not be processed by our office after 5 pm or on weekends.  Start with a light diet upon arrival home, such as soup and crackers or toast.  Be sure to drink plenty of fluids daily.  Resume your  normal diet the day after surgery.  Most patients will experience some swelling and bruising on the chest and neck area.  Ice packs will help.  Swelling and bruising can take several days to resolve.   It is common to experience some constipation after surgery.  Increasing fluid intake and taking a stool softener (Colace) will usually help or prevent this problem.  A mild laxative (Milk of Magnesia or Miralax) should be taken according to package directions if there has been no bowel movement after 48 hours.  You have steri-strips and a gauze dressing over your incision.  You may remove the gauze bandage on the second day after surgery, and you may shower at that time.  Leave your steri-strips (small skin tapes) in place directly over the incision.  These strips should remain on the skin for 5-7 days and then be removed.  You may get them wet in the shower and pat them dry.  You may resume regular (light) daily activities beginning the next day - such as daily self-care, walking, climbing stairs - gradually increasing activities as tolerated.  You may have sexual intercourse when it is comfortable.  Refrain from any heavy lifting or straining until approved by your doctor.  You may drive when you no longer are taking prescription pain medication, you can comfortably wear a seatbelt, and you can safely maneuver your car and apply brakes.  You should see your doctor in the office for a follow-up appointment approximately three weeks after your surgery.  Make sure that you call for this appointment within  a day or two after you arrive home to insure a convenient appointment time.  WHEN TO CALL YOUR DOCTOR: -- Fever greater than 101.5 -- Inability to urinate -- Nausea and/or vomiting - persistent -- Extreme swelling or bruising -- Continued bleeding from incision -- Increased pain, redness, or drainage from the incision -- Difficulty swallowing or breathing -- Muscle cramping or spasms -- Numbness  or tingling in hands or around lips  The clinic staff is available to answer your questions during regular business hours.  Please don't hesitate to call and ask to speak to one of the nurses if you have concerns.  Earnstine Regal, MD, [facility] Office: [phone]  Website: [email or web address]   Increase activity slowly   Complete by:  As directed    Remove dressing in 24 hours   Complete by:  As directed      Allergies as of 06/26/2017      Reactions   Prednisone    Had depression in 1991 after extended use of predisone. Pt has taken this medication since in a short time period, and has had no reaction.    Juniper Oil Rash   Penicillins Rash   Has patient had a PCN reaction causing immediate rash, facial/tongue/throat swelling, SOB or lightheadedness with hypotension: no Has patient had a PCN reaction causing severe rash involving mucus membranes or skin necrosis: no Has patient had a PCN reaction that required hospitalization: no Has patient had a PCN reaction occurring within the last 10 years: no If all of the above answers are "NO", then may proceed with Cephalosporin use.      Medication List    TAKE these medications   albuterol 108 (90 Base) MCG/ACT inhaler Commonly known as:  PROVENTIL HFA;VENTOLIN HFA Inhale 2 puffs into the lungs every 4 (four) hours as needed for wheezing or shortness of breath.   albuterol (2.5 MG/3ML) 0.083% nebulizer solution Commonly known as:  PROVENTIL Take 2.5 mg by nebulization every 4 (four) hours as needed for wheezing or shortness of breath.   ARIPiprazole 10 MG tablet Commonly known as:  ABILIFY Take 10 mg by mouth daily.   budesonide-formoterol 160-4.5 MCG/ACT inhaler Commonly known as:  SYMBICORT Inhale 2 puffs into the lungs 2 (two) times daily.   CENTRUM tablet Take 1 tablet by mouth daily.   hydrochlorothiazide 25 MG tablet Commonly known as:   HYDRODIURIL Take 25 mg by mouth daily.   levothyroxine 100 MCG tablet Commonly known as:  SYNTHROID, LEVOTHROID Take 100 mcg by mouth daily before breakfast.   meloxicam 15 MG tablet Commonly known as:  MOBIC Take 15 mg by mouth daily as needed for pain.   montelukast 10 MG tablet Commonly known as:  SINGULAIR Take 10 mg by mouth at bedtime.   PARoxetine 20 MG tablet Commonly known as:  PAXIL Take 20 mg by mouth daily.   traMADol 50 MG tablet Commonly known as:  ULTRAM Take 1 tablet (50 mg total) by mouth every 6 (six) hours as needed for moderate pain.        Earnstine Regal, MD, [facility] Office: [phone]   Signed: Earnstine Regal 06/26/2017, 8:29 AM

## 2017-06-26 NOTE — Addendum Note (Signed)
Addendum  created 06/26/17 7681 by Lollie Sails, CRNA   Charge Capture section accepted

## 2017-06-26 NOTE — Addendum Note (Signed)
Addendum  created 06/26/17 0867 by Lollie Sails, CRNA   Charge Capture section accepted

## 2017-06-30 DIAGNOSIS — J3089 Other allergic rhinitis: Secondary | ICD-10-CM | POA: Diagnosis not present

## 2017-06-30 DIAGNOSIS — J301 Allergic rhinitis due to pollen: Secondary | ICD-10-CM | POA: Diagnosis not present

## 2017-06-30 NOTE — Progress Notes (Signed)
Please contact patient and notify of benign pathology results.  Ivonne Freeburg M. Cranford Blessinger, MD, [facility] Office: [phone]   

## 2017-07-01 DIAGNOSIS — R05 Cough: Secondary | ICD-10-CM | POA: Diagnosis not present

## 2017-07-01 DIAGNOSIS — F3342 Major depressive disorder, recurrent, in full remission: Secondary | ICD-10-CM | POA: Diagnosis not present

## 2017-07-09 DIAGNOSIS — J301 Allergic rhinitis due to pollen: Secondary | ICD-10-CM | POA: Diagnosis not present

## 2017-07-09 DIAGNOSIS — J3089 Other allergic rhinitis: Secondary | ICD-10-CM | POA: Diagnosis not present

## 2017-07-10 DIAGNOSIS — R911 Solitary pulmonary nodule: Secondary | ICD-10-CM | POA: Diagnosis not present

## 2017-07-10 DIAGNOSIS — Z9889 Other specified postprocedural states: Secondary | ICD-10-CM | POA: Diagnosis not present

## 2017-07-10 DIAGNOSIS — I10 Essential (primary) hypertension: Secondary | ICD-10-CM | POA: Diagnosis not present

## 2017-07-24 DIAGNOSIS — T180XXA Foreign body in mouth, initial encounter: Secondary | ICD-10-CM | POA: Diagnosis not present

## 2017-07-27 ENCOUNTER — Emergency Department (HOSPITAL_COMMUNITY)
Admission: EM | Admit: 2017-07-27 | Discharge: 2017-07-27 | Disposition: A | Discharge status: Home / Self Care | Payer: Medicare Other | Attending: Emergency Medicine | Admitting: Emergency Medicine

## 2017-07-27 ENCOUNTER — Emergency Department (HOSPITAL_COMMUNITY): Payer: Medicare Other

## 2017-07-27 ENCOUNTER — Encounter (HOSPITAL_COMMUNITY): Payer: Self-pay | Admitting: Emergency Medicine

## 2017-07-27 DIAGNOSIS — R0602 Shortness of breath: Secondary | ICD-10-CM | POA: Diagnosis not present

## 2017-07-27 DIAGNOSIS — Z79899 Other long term (current) drug therapy: Secondary | ICD-10-CM | POA: Insufficient documentation

## 2017-07-27 DIAGNOSIS — J069 Acute upper respiratory infection, unspecified: Secondary | ICD-10-CM | POA: Diagnosis not present

## 2017-07-27 DIAGNOSIS — J45909 Unspecified asthma, uncomplicated: Secondary | ICD-10-CM | POA: Diagnosis not present

## 2017-07-27 DIAGNOSIS — R05 Cough: Secondary | ICD-10-CM | POA: Diagnosis not present

## 2017-07-27 DIAGNOSIS — E039 Hypothyroidism, unspecified: Secondary | ICD-10-CM | POA: Insufficient documentation

## 2017-07-27 DIAGNOSIS — I10 Essential (primary) hypertension: Secondary | ICD-10-CM | POA: Diagnosis not present

## 2017-07-27 LAB — CBC WITH DIFFERENTIAL/PLATELET
BASOS PCT: 0 %
Basophils Absolute: 0 10*3/uL (ref 0.0–0.1)
Eosinophils Absolute: 0.4 10*3/uL (ref 0.0–0.7)
Eosinophils Relative: 3 %
HEMATOCRIT: 36.9 % (ref 36.0–46.0)
HEMOGLOBIN: 13 g/dL (ref 12.0–15.0)
LYMPHS ABS: 3.2 10*3/uL (ref 0.7–4.0)
Lymphocytes Relative: 27 %
MCH: 30.3 pg (ref 26.0–34.0)
MCHC: 35.2 g/dL (ref 30.0–36.0)
MCV: 86 fL (ref 78.0–100.0)
MONO ABS: 0.9 10*3/uL (ref 0.1–1.0)
Monocytes Relative: 7 %
NEUTROS ABS: 7.4 10*3/uL (ref 1.7–7.7)
NEUTROS PCT: 63 %
Platelets: 283 10*3/uL (ref 150–400)
RBC: 4.29 MIL/uL (ref 3.87–5.11)
RDW: 12.9 % (ref 11.5–15.5)
WBC: 11.9 10*3/uL — ABNORMAL HIGH (ref 4.0–10.5)

## 2017-07-27 LAB — BASIC METABOLIC PANEL
ANION GAP: 10 (ref 5–15)
BUN: 28 mg/dL — ABNORMAL HIGH (ref 6–20)
CALCIUM: 8.9 mg/dL (ref 8.9–10.3)
CHLORIDE: 96 mmol/L — AB (ref 101–111)
CO2: 27 mmol/L (ref 22–32)
CREATININE: 0.69 mg/dL (ref 0.44–1.00)
GFR calc non Af Amer: >60 mL/min (ref >60)
GLUCOSE: 94 mg/dL (ref 65–99)
Potassium: 2.8 mmol/L — ABNORMAL LOW (ref 3.5–5.1)
Sodium: 133 mmol/L — ABNORMAL LOW (ref 135–145)

## 2017-07-27 MED ORDER — AZITHROMYCIN 250 MG PO TABS: 250.0000 mg | ORAL_TABLET | Freq: Every day | ORAL | 0 refills | Status: DC

## 2017-07-27 MED ORDER — PREDNISONE 20 MG PO TABS: 40.0000 mg | ORAL_TABLET | Freq: Every day | ORAL | 0 refills | Status: DC

## 2017-07-27 MED ORDER — IOPAMIDOL (ISOVUE-370) INJECTION 76%
INTRAVENOUS | Status: AC
  Administered 2017-07-27: 100 mL
  Filled 2017-07-27: qty 100

## 2017-07-27 NOTE — ED Provider Notes (Signed)
Patient placed in Quick Look pathway, seen and evaluated   Chief Complaint: cough, hemoptysis x3 weeks, sent for chest CT  HPI:   Reports slight hemoptysis, productive cough for the past 3 weeks; initial CXR done in PCP office shows nodule of unknown significance; f/u CXR done last week but no results yet; reports ongoing sx so PCP sent here; hx of thyroid surgery 3 weeks ago; no hx DVT/PE/MI, denies chest pain, SOB, leg swelling  ROS: hemoptysis  Physical Exam:   Gen: No distress  Neuro: Awake and Alert  Skin: Warm  Focused Exam: slight end expiratory wheezing in L lower lung field, no respiratory distress; RRR, no chest TTP, no BLE edema, erythema or calf tenderness   Initiation of care has begun. The patient has been counseled on the process, plan, and necessity for staying for the completion/evaluation, and the remainder of the medical screening examination   Delia Heady, PA-C 07/27/17 [place], MD 07/27/17 [phone]

## 2017-07-27 NOTE — ED Triage Notes (Signed)
Patient had chest xray done 06/18/17 and showed nodule suggested CT scan. Patient had cough with some blood in it for 5 days. Patient denies any pain

## 2017-07-27 NOTE — ED Provider Notes (Signed)
Patient placed in Quick Look pathway, seen and evaluated   Chief Complaint: cough, hemoptysis x3 weeks, sent for chest CT  HPI:   Reports slight hemoptysis, productive cough for the past 3 weeks; initial CXR done in PCP office shows nodule of unknown significance; f/u CXR done last week but no results yet; reports ongoing sx so PCP sent here; hx of thyroid surgery 3 weeks ago; no hx DVT/PE/MI, denies chest pain, SOB, leg swelling  ROS: hemoptysis  Physical Exam:   Gen: No distress  Neuro: Awake and Alert  Skin: Warm  Focused Exam: slight end expiratory wheezing in L lower lung field, no respiratory distress; RRR, no chest TTP, no BLE edema, erythema or calf tenderness   Initiation of care has begun. The patient has been counseled on the process, plan, and necessity for staying for the completion/evaluation, and the remainder of the medical screening examination   Delia Heady, PA-C 07/27/17 1617    Sidney Ace 07/27/17 2223    Hayden Rasmussen, MD 07/28/17 1743

## 2017-07-28 NOTE — ED Provider Notes (Signed)
[facility] DEPT Provider Note   CSN: [id number] Arrival date & time: 07/27/17  1552     History   Chief Complaint Chief Complaint  Patient presents with  . sent for CT of chest  . coughing up blood    HPI Becky Davis is a 67 y.o. female.  HPI Patient sent in by primary care office.  Had chest x-ray done a couple weeks ago that showed potential nodule.  Reportedly had a follow-up x-ray after that although unsure about the read.  Now has some mild hemoptysis.  Has had cough without real sputum production.  History of asthma.  Has been using her albuterol more frequently.  No swelling in her legs.  No chest pain.  No other bleeding. Past Medical History:  Diagnosis Date  . Arthritis    knees  . Asthma   . Headache    hx of 20 years ago  . Heart murmur    hx of 10-15 years ago  . Hypertension   . Hypothyroidism   . Pneumonia   . Pre-diabetes   . Thyroid nodule     Patient Active Problem List   Diagnosis Date Noted  . Left thyroid nodule 06/25/2017    Past Surgical History:  Procedure Laterality Date  . ABDOMINAL HYSTERECTOMY    . thyroid goiter     surgery  . THYROID LOBECTOMY     06-25-17 Dr. Harlow Asa   Left  . THYROID LOBECTOMY Left 06/25/2017   Procedure: LEFT THYROID LOBECTOMY;  Surgeon: Armandina Gemma, MD;  Location: [facility];  Service: General;  Laterality: Left;    OB History    No data available       Home Medications    Prior to Admission medications   Medication Sig Start Date End Date Taking? Authorizing Provider  albuterol (PROVENTIL HFA;VENTOLIN HFA) 108 (90 Base) MCG/ACT inhaler Inhale 2 puffs into the lungs every 4 (four) hours as needed for wheezing or shortness of breath.    [provider]  albuterol (PROVENTIL) (2.5 MG/3ML) 0.083% nebulizer solution Take 2.5 mg by nebulization every 4 (four) hours as needed for wheezing or shortness of breath.    [provider]  ARIPiprazole (ABILIFY) 10  MG tablet Take 10 mg by mouth daily.    [provider]  azithromycin (ZITHROMAX) 250 MG tablet Take 1 tablet (250 mg total) by mouth daily. Take first 2 tablets together, then 1 every day until finished. 07/27/17   Davonna Belling, MD  budesonide-formoterol [facility]) 160-4.5 MCG/ACT inhaler Inhale 2 puffs into the lungs 2 (two) times daily.    [provider]  hydrochlorothiazide (HYDRODIURIL) 25 MG tablet Take 25 mg by mouth daily.    [provider]  levothyroxine (SYNTHROID, LEVOTHROID) 100 MCG tablet Take 100 mcg by mouth daily before breakfast.    [provider]  meloxicam (MOBIC) 15 MG tablet Take 15 mg by mouth daily as needed for pain.    [provider]  montelukast (SINGULAIR) 10 MG tablet Take 10 mg by mouth at bedtime.    [provider]  Multiple Vitamins-Minerals (CENTRUM) tablet Take 1 tablet by mouth daily.    [provider]  PARoxetine (PAXIL) 20 MG tablet Take 20 mg by mouth daily.    [provider]  predniSONE (DELTASONE) 20 MG tablet Take 2 tablets (40 mg total) by mouth daily. 07/27/17   Davonna Belling, MD  traMADol (ULTRAM) 50 MG tablet Take 1 tablet (50 mg total)  by mouth every 6 (six) hours as needed for moderate pain. 06/26/17   Armandina Gemma, MD    Family History No family history on file.  Social History Social History   Tobacco Use  . Smoking status: Never Smoker  . Smokeless tobacco: Never Used  Substance Use Topics  . Alcohol use: No    Frequency: Never  . Drug use: No     Allergies   Prednisone; Juniper oil; and Penicillins   Review of Systems Review of Systems  Constitutional: Negative for appetite change.  HENT: Negative for congestion.   Respiratory: Positive for cough, shortness of breath and wheezing.        Hemoptysis  Cardiovascular: Negative for chest pain.  Gastrointestinal: Negative for abdominal distention.  Genitourinary: Negative for flank pain.    Musculoskeletal: Negative for back pain.  Skin: Negative for pallor.  Neurological: Negative for numbness.  Hematological: Negative for adenopathy.  Psychiatric/Behavioral: Negative for confusion.     Physical Exam Updated Vital Signs BP 139/84 (BP Location: Left Arm)   Pulse 83   Temp 97.7 F (36.5 C) (Oral)   Resp 16   Ht 5\' 3"  (1.6 m)   Wt 91.7 kg (202 lb 4 oz)   SpO2 98%   BMI 35.83 kg/m   Physical Exam  Constitutional: She appears well-developed and well-nourished.  HENT:  Head: Normocephalic.  Eyes: Pupils are equal, round, and reactive to light.  Neck: Neck supple.  Cardiovascular: Normal rate.  Pulmonary/Chest:  Harsh breath sounds with some wheezing.  Abdominal: There is no tenderness.  Musculoskeletal: She exhibits no edema.  Neurological: She is alert.  Skin: Skin is warm. Capillary refill takes less than 2 seconds.     ED Treatments / Results  Labs (all labs ordered are listed, but only abnormal results are displayed) Labs Reviewed  BASIC METABOLIC PANEL - Abnormal; Notable for the following components:      Result Value   Sodium 133 (*)    Potassium 2.8 (*)    Chloride 96 (*)    BUN 28 (*)    All other components within normal limits  CBC WITH DIFFERENTIAL/PLATELET - Abnormal; Notable for the following components:   WBC 11.9 (*)    All other components within normal limits    EKG  EKG Interpretation None       Radiology Dg Chest 2 View  Result Date: 07/27/2017 CLINICAL DATA:  67 year old female with cough and hemoptysis for the past 5 days EXAM: CHEST  2 VIEW COMPARISON:  Prior chest x-ray 07/10/2017 FINDINGS: The lungs are clear and negative for focal airspace consolidation, pulmonary edema or suspicious pulmonary nodule. No pleural effusion or pneumothorax. Cardiac and mediastinal contours are within normal limits. No acute fracture or lytic or blastic osseous lesions. The visualized upper abdominal bowel gas pattern is unremarkable.  IMPRESSION: Negative chest x-ray. Electronically Signed   By: Jacqulynn Cadet M.D.   On: 07/27/2017 16:37   Ct Angio Chest Pe W/cm &/or Wo Cm  Result Date: 07/27/2017 CLINICAL DATA:  67 year old who underwent left hemithyroidectomy 06/25/2017, presenting with three-week history of productive cough and intermittent hemoptysis. EXAM: CT ANGIOGRAPHY CHEST WITH CONTRAST TECHNIQUE: Multidetector CT imaging of the chest was performed using the standard protocol during bolus administration of intravenous contrast. Multiplanar CT image reconstructions and MIPs were obtained to evaluate the vascular anatomy. CONTRAST:  159mL ISOVUE-370 IOPAMIDOL INJECTION 76% IV. COMPARISON:  No prior CT. Chest x-rays earlier same day, 07/10/2017, 06/18/2017. FINDINGS: Cardiovascular: Contrast opacification of  pulmonary arteries is moderate to good. Respiratory motion blurs images of the mid lungs. Overall, the study is of moderate to good diagnostic quality. No filling defects within either main pulmonary artery or their segmental branches in either lung to suggest pulmonary embolism. Heart size normal. No visible coronary atherosclerosis. No pericardial effusion. No visible atherosclerosis involving the thoracic or upper abdominal aorta. Bovine aortic arch anatomy (left common carotid artery arises from the innominate artery). Mediastinum/Nodes: No pathologically enlarged mediastinal, hilar or axillary lymph nodes. No mediastinal masses. Normal-appearing esophagus. Postsurgical changes related to the recent left hemithyroidectomy. Lungs/Pleura: Pulmonary parenchyma clear without airspace consolidation, interstitial disease or pulmonary nodules or masses. Central airways patent with moderate bronchial wall thickening. No pleural effusions. No pleural masses. Upper Abdomen: Visualized upper abdomen unremarkable for the early arterial phase of enhancement. Musculoskeletal: Mild degenerative disc disease and spondylosis involving the  thoracic spine. No acute abnormalities. Review of the MIP images confirms the above findings. IMPRESSION: 1. No evidence of pulmonary embolism. 2. Moderate bronchial wall thickening indicating asthma and/or bronchitis. No acute cardiopulmonary disease otherwise. Electronically Signed   By: Evangeline Dakin M.D.   On: 07/27/2017 18:36    Procedures Procedures (including critical care time)  Medications Ordered in ED Medications  iopamidol (ISOVUE-370) 76 % injection (100 mLs  Contrast Given 07/27/17 1815)     Initial Impression / Assessment and Plan / ED Course  I have reviewed the triage vital signs and the nursing notes.  Pertinent labs & imaging results that were available during my care of the patient were reviewed by me and considered in my medical decision making (see chart for details).     Patient with likely URI.  Has had sputum production for the last couple weeks.  With her asthma we will treat as an infection.  Azithromycin and steroids.  Has had steroids with short courses in the past without mental status changes.  Will need to follow-up for her hypokalemia also.  Discharge home.  Final Clinical Impressions(s) / ED Diagnoses   Final diagnoses:  Upper respiratory tract infection, unspecified type  Uncomplicated asthma, unspecified asthma severity, unspecified whether persistent    ED Discharge Orders        Ordered    predniSONE (DELTASONE) 20 MG tablet  Daily     07/27/17 2258    azithromycin (ZITHROMAX) 250 MG tablet  Daily     07/27/17 2258       Davonna Belling, MD 07/28/17 0020

## 2017-08-03 DIAGNOSIS — J301 Allergic rhinitis due to pollen: Secondary | ICD-10-CM | POA: Diagnosis not present

## 2017-08-03 DIAGNOSIS — J3089 Other allergic rhinitis: Secondary | ICD-10-CM | POA: Diagnosis not present

## 2017-08-06 DIAGNOSIS — T180XXA Foreign body in mouth, initial encounter: Secondary | ICD-10-CM | POA: Diagnosis not present

## 2017-08-07 DIAGNOSIS — E039 Hypothyroidism, unspecified: Secondary | ICD-10-CM | POA: Diagnosis not present

## 2017-08-07 DIAGNOSIS — E041 Nontoxic single thyroid nodule: Secondary | ICD-10-CM | POA: Diagnosis not present

## 2017-08-07 DIAGNOSIS — R490 Dysphonia: Secondary | ICD-10-CM | POA: Diagnosis not present

## 2017-08-13 DIAGNOSIS — J3089 Other allergic rhinitis: Secondary | ICD-10-CM | POA: Diagnosis not present

## 2017-08-13 DIAGNOSIS — J301 Allergic rhinitis due to pollen: Secondary | ICD-10-CM | POA: Diagnosis not present

## 2017-08-18 DIAGNOSIS — T180XXA Foreign body in mouth, initial encounter: Secondary | ICD-10-CM | POA: Diagnosis not present

## 2017-08-19 DIAGNOSIS — J301 Allergic rhinitis due to pollen: Secondary | ICD-10-CM | POA: Diagnosis not present

## 2017-08-19 DIAGNOSIS — J3089 Other allergic rhinitis: Secondary | ICD-10-CM | POA: Diagnosis not present

## 2017-08-27 DIAGNOSIS — J45909 Unspecified asthma, uncomplicated: Secondary | ICD-10-CM | POA: Diagnosis not present

## 2017-08-27 DIAGNOSIS — Z9889 Other specified postprocedural states: Secondary | ICD-10-CM | POA: Diagnosis not present

## 2017-08-27 DIAGNOSIS — R911 Solitary pulmonary nodule: Secondary | ICD-10-CM | POA: Diagnosis not present

## 2017-08-27 DIAGNOSIS — L409 Psoriasis, unspecified: Secondary | ICD-10-CM | POA: Diagnosis not present

## 2017-08-27 DIAGNOSIS — E039 Hypothyroidism, unspecified: Secondary | ICD-10-CM | POA: Diagnosis not present

## 2017-08-27 DIAGNOSIS — Z0000 Encounter for general adult medical examination without abnormal findings: Secondary | ICD-10-CM | POA: Diagnosis not present

## 2017-08-27 DIAGNOSIS — E781 Pure hyperglyceridemia: Secondary | ICD-10-CM | POA: Diagnosis not present

## 2017-08-27 DIAGNOSIS — F4329 Adjustment disorder with other symptoms: Secondary | ICD-10-CM | POA: Diagnosis not present

## 2017-08-27 DIAGNOSIS — I10 Essential (primary) hypertension: Secondary | ICD-10-CM | POA: Diagnosis not present

## 2017-08-27 DIAGNOSIS — R739 Hyperglycemia, unspecified: Secondary | ICD-10-CM | POA: Diagnosis not present

## 2017-08-27 DIAGNOSIS — Z6835 Body mass index (BMI) 35.0-35.9, adult: Secondary | ICD-10-CM | POA: Diagnosis not present

## 2017-09-03 ENCOUNTER — Other Ambulatory Visit: Payer: Self-pay | Admitting: Family Medicine

## 2017-09-03 DIAGNOSIS — E2839 Other primary ovarian failure: Secondary | ICD-10-CM

## 2017-09-03 DIAGNOSIS — J3089 Other allergic rhinitis: Secondary | ICD-10-CM | POA: Diagnosis not present

## 2017-09-03 DIAGNOSIS — J301 Allergic rhinitis due to pollen: Secondary | ICD-10-CM | POA: Diagnosis not present

## 2017-09-07 DIAGNOSIS — J301 Allergic rhinitis due to pollen: Secondary | ICD-10-CM | POA: Diagnosis not present

## 2017-09-07 DIAGNOSIS — J3089 Other allergic rhinitis: Secondary | ICD-10-CM | POA: Diagnosis not present

## 2017-09-10 DIAGNOSIS — Z1231 Encounter for screening mammogram for malignant neoplasm of breast: Secondary | ICD-10-CM | POA: Diagnosis not present

## 2017-09-10 DIAGNOSIS — J301 Allergic rhinitis due to pollen: Secondary | ICD-10-CM | POA: Diagnosis not present

## 2017-09-10 DIAGNOSIS — J3089 Other allergic rhinitis: Secondary | ICD-10-CM | POA: Diagnosis not present

## 2017-09-14 DIAGNOSIS — J3089 Other allergic rhinitis: Secondary | ICD-10-CM | POA: Diagnosis not present

## 2017-09-14 DIAGNOSIS — J301 Allergic rhinitis due to pollen: Secondary | ICD-10-CM | POA: Diagnosis not present

## 2017-09-16 DIAGNOSIS — J301 Allergic rhinitis due to pollen: Secondary | ICD-10-CM | POA: Diagnosis not present

## 2017-09-16 DIAGNOSIS — J3089 Other allergic rhinitis: Secondary | ICD-10-CM | POA: Diagnosis not present

## 2017-09-24 DIAGNOSIS — J301 Allergic rhinitis due to pollen: Secondary | ICD-10-CM | POA: Diagnosis not present

## 2017-09-24 DIAGNOSIS — J3089 Other allergic rhinitis: Secondary | ICD-10-CM | POA: Diagnosis not present

## 2017-09-30 DIAGNOSIS — R0683 Snoring: Secondary | ICD-10-CM | POA: Diagnosis not present

## 2017-09-30 DIAGNOSIS — E039 Hypothyroidism, unspecified: Secondary | ICD-10-CM | POA: Diagnosis not present

## 2017-09-30 DIAGNOSIS — J45909 Unspecified asthma, uncomplicated: Secondary | ICD-10-CM | POA: Diagnosis not present

## 2017-09-30 DIAGNOSIS — I10 Essential (primary) hypertension: Secondary | ICD-10-CM | POA: Diagnosis not present

## 2017-09-30 DIAGNOSIS — R490 Dysphonia: Secondary | ICD-10-CM | POA: Diagnosis not present

## 2017-09-30 DIAGNOSIS — G4719 Other hypersomnia: Secondary | ICD-10-CM | POA: Diagnosis not present

## 2017-10-08 DIAGNOSIS — J3089 Other allergic rhinitis: Secondary | ICD-10-CM | POA: Diagnosis not present

## 2017-10-08 DIAGNOSIS — J301 Allergic rhinitis due to pollen: Secondary | ICD-10-CM | POA: Diagnosis not present

## 2017-10-14 DIAGNOSIS — F4323 Adjustment disorder with mixed anxiety and depressed mood: Secondary | ICD-10-CM | POA: Diagnosis not present

## 2017-10-15 DIAGNOSIS — K219 Gastro-esophageal reflux disease without esophagitis: Secondary | ICD-10-CM | POA: Diagnosis not present

## 2017-10-15 DIAGNOSIS — R1310 Dysphagia, unspecified: Secondary | ICD-10-CM | POA: Diagnosis not present

## 2017-10-21 DIAGNOSIS — J301 Allergic rhinitis due to pollen: Secondary | ICD-10-CM | POA: Diagnosis not present

## 2017-10-28 DIAGNOSIS — R1310 Dysphagia, unspecified: Secondary | ICD-10-CM | POA: Diagnosis not present

## 2017-10-28 DIAGNOSIS — K2930 Chronic superficial gastritis without bleeding: Secondary | ICD-10-CM | POA: Diagnosis not present

## 2017-10-28 DIAGNOSIS — R12 Heartburn: Secondary | ICD-10-CM | POA: Diagnosis not present

## 2017-10-30 DIAGNOSIS — F4323 Adjustment disorder with mixed anxiety and depressed mood: Secondary | ICD-10-CM | POA: Diagnosis not present

## 2017-11-02 DIAGNOSIS — J301 Allergic rhinitis due to pollen: Secondary | ICD-10-CM | POA: Diagnosis not present

## 2017-11-02 DIAGNOSIS — J3089 Other allergic rhinitis: Secondary | ICD-10-CM | POA: Diagnosis not present

## 2017-11-04 DIAGNOSIS — K2930 Chronic superficial gastritis without bleeding: Secondary | ICD-10-CM | POA: Diagnosis not present

## 2017-11-04 DIAGNOSIS — G4733 Obstructive sleep apnea (adult) (pediatric): Secondary | ICD-10-CM | POA: Diagnosis not present

## 2017-11-11 DIAGNOSIS — J3089 Other allergic rhinitis: Secondary | ICD-10-CM | POA: Diagnosis not present

## 2017-11-11 DIAGNOSIS — J3081 Allergic rhinitis due to animal (cat) (dog) hair and dander: Secondary | ICD-10-CM | POA: Diagnosis not present

## 2017-11-11 DIAGNOSIS — J301 Allergic rhinitis due to pollen: Secondary | ICD-10-CM | POA: Diagnosis not present

## 2017-11-18 DIAGNOSIS — F4323 Adjustment disorder with mixed anxiety and depressed mood: Secondary | ICD-10-CM | POA: Diagnosis not present

## 2017-11-19 DIAGNOSIS — J301 Allergic rhinitis due to pollen: Secondary | ICD-10-CM | POA: Diagnosis not present

## 2017-11-19 DIAGNOSIS — J3089 Other allergic rhinitis: Secondary | ICD-10-CM | POA: Diagnosis not present

## 2017-11-26 DIAGNOSIS — J3089 Other allergic rhinitis: Secondary | ICD-10-CM | POA: Diagnosis not present

## 2017-11-26 DIAGNOSIS — J4540 Moderate persistent asthma, uncomplicated: Secondary | ICD-10-CM | POA: Diagnosis not present

## 2017-11-26 DIAGNOSIS — J301 Allergic rhinitis due to pollen: Secondary | ICD-10-CM | POA: Diagnosis not present

## 2017-11-27 DIAGNOSIS — H2513 Age-related nuclear cataract, bilateral: Secondary | ICD-10-CM | POA: Diagnosis not present

## 2017-12-01 DIAGNOSIS — J3089 Other allergic rhinitis: Secondary | ICD-10-CM | POA: Diagnosis not present

## 2017-12-01 DIAGNOSIS — J301 Allergic rhinitis due to pollen: Secondary | ICD-10-CM | POA: Diagnosis not present

## 2017-12-10 DIAGNOSIS — F4323 Adjustment disorder with mixed anxiety and depressed mood: Secondary | ICD-10-CM | POA: Diagnosis not present

## 2017-12-16 DIAGNOSIS — J3089 Other allergic rhinitis: Secondary | ICD-10-CM | POA: Diagnosis not present

## 2017-12-16 DIAGNOSIS — J301 Allergic rhinitis due to pollen: Secondary | ICD-10-CM | POA: Diagnosis not present

## 2017-12-17 DIAGNOSIS — I10 Essential (primary) hypertension: Secondary | ICD-10-CM | POA: Diagnosis not present

## 2017-12-30 DIAGNOSIS — J3089 Other allergic rhinitis: Secondary | ICD-10-CM | POA: Diagnosis not present

## 2017-12-30 DIAGNOSIS — J301 Allergic rhinitis due to pollen: Secondary | ICD-10-CM | POA: Diagnosis not present

## 2018-01-08 DIAGNOSIS — F4323 Adjustment disorder with mixed anxiety and depressed mood: Secondary | ICD-10-CM | POA: Diagnosis not present

## 2018-01-11 DIAGNOSIS — J301 Allergic rhinitis due to pollen: Secondary | ICD-10-CM | POA: Diagnosis not present

## 2018-01-11 DIAGNOSIS — J3089 Other allergic rhinitis: Secondary | ICD-10-CM | POA: Diagnosis not present

## 2018-01-11 DIAGNOSIS — J4540 Moderate persistent asthma, uncomplicated: Secondary | ICD-10-CM | POA: Diagnosis not present

## 2018-01-11 DIAGNOSIS — J4541 Moderate persistent asthma with (acute) exacerbation: Secondary | ICD-10-CM | POA: Diagnosis not present

## 2018-01-13 ENCOUNTER — Encounter: Payer: Self-pay | Admitting: Pulmonary Disease

## 2018-01-13 DIAGNOSIS — Z9889 Other specified postprocedural states: Secondary | ICD-10-CM | POA: Diagnosis not present

## 2018-01-13 DIAGNOSIS — J45909 Unspecified asthma, uncomplicated: Secondary | ICD-10-CM | POA: Diagnosis not present

## 2018-01-13 DIAGNOSIS — Z6835 Body mass index (BMI) 35.0-35.9, adult: Secondary | ICD-10-CM | POA: Diagnosis not present

## 2018-01-13 DIAGNOSIS — F4329 Adjustment disorder with other symptoms: Secondary | ICD-10-CM | POA: Diagnosis not present

## 2018-01-13 DIAGNOSIS — R739 Hyperglycemia, unspecified: Secondary | ICD-10-CM | POA: Diagnosis not present

## 2018-01-13 DIAGNOSIS — E781 Pure hyperglyceridemia: Secondary | ICD-10-CM | POA: Diagnosis not present

## 2018-01-13 DIAGNOSIS — G4730 Sleep apnea, unspecified: Secondary | ICD-10-CM | POA: Diagnosis not present

## 2018-01-13 DIAGNOSIS — E039 Hypothyroidism, unspecified: Secondary | ICD-10-CM | POA: Diagnosis not present

## 2018-01-13 DIAGNOSIS — I10 Essential (primary) hypertension: Secondary | ICD-10-CM | POA: Diagnosis not present

## 2018-01-18 ENCOUNTER — Other Ambulatory Visit: Payer: Self-pay | Admitting: Internal Medicine

## 2018-01-18 DIAGNOSIS — E039 Hypothyroidism, unspecified: Secondary | ICD-10-CM | POA: Diagnosis not present

## 2018-01-18 DIAGNOSIS — E041 Nontoxic single thyroid nodule: Secondary | ICD-10-CM | POA: Diagnosis not present

## 2018-01-20 DIAGNOSIS — J3089 Other allergic rhinitis: Secondary | ICD-10-CM | POA: Diagnosis not present

## 2018-01-20 DIAGNOSIS — G4733 Obstructive sleep apnea (adult) (pediatric): Secondary | ICD-10-CM | POA: Diagnosis not present

## 2018-01-20 DIAGNOSIS — J301 Allergic rhinitis due to pollen: Secondary | ICD-10-CM | POA: Diagnosis not present

## 2018-01-25 DIAGNOSIS — F4322 Adjustment disorder with anxiety: Secondary | ICD-10-CM | POA: Diagnosis not present

## 2018-02-08 ENCOUNTER — Other Ambulatory Visit (INDEPENDENT_AMBULATORY_CARE_PROVIDER_SITE_OTHER): Payer: Medicare Other

## 2018-02-08 ENCOUNTER — Ambulatory Visit (INDEPENDENT_AMBULATORY_CARE_PROVIDER_SITE_OTHER): Payer: Medicare Other | Admitting: Pulmonary Disease

## 2018-02-08 ENCOUNTER — Encounter: Payer: Self-pay | Admitting: Pulmonary Disease

## 2018-02-08 VITALS — BP 122/78 | HR 68 | Ht 63.0 in | Wt 199.0 lb

## 2018-02-08 DIAGNOSIS — J3089 Other allergic rhinitis: Secondary | ICD-10-CM | POA: Diagnosis not present

## 2018-02-08 DIAGNOSIS — J301 Allergic rhinitis due to pollen: Secondary | ICD-10-CM | POA: Diagnosis not present

## 2018-02-08 DIAGNOSIS — J45909 Unspecified asthma, uncomplicated: Secondary | ICD-10-CM

## 2018-02-08 LAB — CBC WITH DIFFERENTIAL/PLATELET
BASOS ABS: 0 10*3/uL (ref 0.0–0.1)
Basophils Relative: 0.6 % (ref 0.0–3.0)
EOS ABS: 0.2 10*3/uL (ref 0.0–0.7)
Eosinophils Relative: 2.1 % (ref 0.0–5.0)
HEMATOCRIT: 35.9 % — AB (ref 36.0–46.0)
HEMOGLOBIN: 12.3 g/dL (ref 12.0–15.0)
LYMPHS PCT: 25.8 % (ref 12.0–46.0)
Lymphs Abs: 2 10*3/uL (ref 0.7–4.0)
MCHC: 34.2 g/dL (ref 30.0–36.0)
MCV: 88.1 fl (ref 78.0–100.0)
Monocytes Absolute: 0.8 10*3/uL (ref 0.1–1.0)
Monocytes Relative: 9.9 % (ref 3.0–12.0)
NEUTROS ABS: 4.7 10*3/uL (ref 1.4–7.7)
Neutrophils Relative %: 61.6 % (ref 43.0–77.0)
PLATELETS: 285 10*3/uL (ref 150.0–400.0)
RBC: 4.07 Mil/uL (ref 3.87–5.11)
RDW: 13.6 % (ref 11.5–15.5)
WBC: 7.7 10*3/uL (ref 4.0–10.5)

## 2018-02-08 LAB — NITRIC OXIDE: NITRIC OXIDE: 17

## 2018-02-08 NOTE — Patient Instructions (Signed)
Continue using the Symbicort We will check CBC differential, blood allergy profile Schedule you for pulmonary function test.  Return to clinic in 1 to 2 months.

## 2018-02-08 NOTE — Progress Notes (Signed)
Becky Davis    [id number]    02/16/1951  Primary Care Physician:Wong, Edwyna Shell, MD  Referring Physician: [name], MD [address] [place], [place] [address]  Chief complaint: Consult for asthma  HPI: 67 year old with hypertension, asthma, allergies, sleep apnea, thyroidectomy Follows with [facility] and is on immunotherapy, allergy shots.  She was seen at her allergy office in early July for exacerbation given a prednisone taper.  Prior to that he was seen at [facility] on ED in January 2019 for intermittent hemoptysis, asthma exacerbation treated with azithromycin and steroids.  CT did not show pulmonary embolism, lung mass or nodule.  Maintained on Symbicort, singular, albuterol as needed.  Has daily symptoms with dyspnea with activity, nighttime awakenings.  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  Exposures: Reports remote asbestos exposure.  No mold, hot tub, Jacuzzi. Smoking history: Never smoker Travel history: Lived in [place] all her life.  Vacation travel to [place], [place].   Outpatient Encounter Medications as of 02/08/2018  Medication Sig  . albuterol (PROVENTIL) (2.5 MG/3ML) 0.083% nebulizer solution Take 2.5 mg by nebulization every 4 (four) hours as needed for wheezing or shortness of breath.  . Albuterol Sulfate (PROAIR HFA IN) Inhale into the lungs.  . ARIPiprazole (ABILIFY) 10 MG tablet Take 10 mg by mouth daily.  . budesonide-formoterol (SYMBICORT) 80-4.5 MCG/ACT inhaler Inhale 2 puffs into the lungs 2 (two) times daily.  Marland Kitchen levothyroxine (SYNTHROID, LEVOTHROID) 100 MCG tablet Take 100 mcg by mouth daily before breakfast.  . meloxicam (MOBIC) 15 MG tablet Take 15 mg by mouth daily as needed for pain.  . Multiple Vitamins-Minerals (CENTRUM) tablet Take 1 tablet by mouth daily.  . pantoprazole (PROTONIX) 40 MG tablet Take 40 mg by mouth daily.  Marland Kitchen PARoxetine (PAXIL) 20 MG tablet Take 20 mg by mouth  daily.  . ranitidine (ZANTAC) 300 MG capsule Take 300 mg by mouth every evening.  . [DISCONTINUED] albuterol (PROVENTIL HFA;VENTOLIN HFA) 108 (90 Base) MCG/ACT inhaler Inhale 2 puffs into the lungs every 4 (four) hours as needed for wheezing or shortness of breath.  . montelukast (SINGULAIR) 10 MG tablet Take 10 mg by mouth at bedtime.  . [DISCONTINUED] azithromycin (ZITHROMAX) 250 MG tablet Take 1 tablet (250 mg total) by mouth daily. Take first 2 tablets together, then 1 every day until finished.  . [DISCONTINUED] budesonide-formoterol (SYMBICORT) 160-4.5 MCG/ACT inhaler Inhale 2 puffs into the lungs 2 (two) times daily.  . [DISCONTINUED] hydrochlorothiazide (HYDRODIURIL) 25 MG tablet Take 25 mg by mouth daily.  . [DISCONTINUED] predniSONE (DELTASONE) 20 MG tablet Take 2 tablets (40 mg total) by mouth daily.  . [DISCONTINUED] traMADol (ULTRAM) 50 MG tablet Take 1 tablet (50 mg total) by mouth every 6 (six) hours as needed for moderate pain.   No facility-administered encounter medications on file as of 02/08/2018.     Allergies as of 02/08/2018 - Review Complete 02/08/2018  Allergen Reaction Noted  . Prednisone  06/25/2017  . Juniper oil Rash 06/25/2017  . Penicillins Rash 06/15/2017    Past Medical History:  Diagnosis Date  . Arthritis    knees  . Asthma   . Headache    hx of 20 years ago  . Heart murmur    hx of 10-15 years ago  . Hypertension   . Hypothyroidism   . Pneumonia   . Pre-diabetes   . Thyroid nodule  Past Surgical History:  Procedure Laterality Date  . ABDOMINAL HYSTERECTOMY    . thyroid goiter     surgery  . THYROID LOBECTOMY     06-25-17 Dr. Harlow Asa   Left  . THYROID LOBECTOMY Left 06/25/2017   Procedure: LEFT THYROID LOBECTOMY;  Surgeon: Armandina Gemma, MD;  Location: [facility];  Service: General;  Laterality: Left;    Family History  Problem Relation Age of Onset  . Emphysema Mother   . Cancer Mother   . Heart disease Mother   . Cancer Sister   .  Cancer Brother     Social History   Socioeconomic History  . Marital status: Married    Spouse name: Not on file  . Number of children: Not on file  . Years of education: Not on file  . Highest education level: Not on file  Occupational History  . Not on file  Social Needs  . Financial resource strain: Not on file  . Food insecurity:    Worry: Not on file    Inability: Not on file  . Transportation needs:    Medical: Not on file    Non-medical: Not on file  Tobacco Use  . Smoking status: Never Smoker  . Smokeless tobacco: Never Used  Substance and Sexual Activity  . Alcohol use: No    Frequency: Never  . Drug use: No  . Sexual activity: Not Currently  Lifestyle  . Physical activity:    Days per week: Not on file    Minutes per session: Not on file  . Stress: Not on file  Relationships  . Social connections:    Talks on phone: Not on file    Gets together: Not on file    Attends religious service: Not on file    Active member of club or organization: Not on file    Attends meetings of clubs or organizations: Not on file    Relationship status: Not on file  . Intimate partner violence:    Fear of current or ex partner: Not on file    Emotionally abused: Not on file    Physically abused: Not on file    Forced sexual activity: Not on file  Other Topics Concern  . Not on file  Social History Narrative  . Not on file    Review of systems: Review of Systems  Constitutional: Negative for fever and chills.  HENT: Negative.   Eyes: Negative for blurred vision.  Respiratory: as per HPI  Cardiovascular: Negative for chest pain and palpitations.  Gastrointestinal: Negative for vomiting, diarrhea, blood per rectum. Genitourinary: Negative for dysuria, urgency, frequency and hematuria.  Musculoskeletal: Negative for myalgias, back pain and joint pain.  Skin: Negative for itching and rash.  Neurological: Negative for dizziness, tremors, focal weakness, seizures and  loss of consciousness.  Endo/Heme/Allergies: Negative for environmental allergies.  Psychiatric/Behavioral: Negative for depression, suicidal ideas and hallucinations.  All other systems reviewed and are negative.  Physical Exam: Blood pressure 122/78, pulse 68, height 5\' 3"  (1.6 m), weight 199 lb (90.3 kg), SpO2 97 %. Gen:      No acute distress HEENT:  EOMI, sclera anicteric Neck:     No masses; no thyromegaly Lungs:    Clear to auscultation bilaterally; normal respiratory effort CV:         Regular rate and rhythm; no murmurs Abd:      + bowel sounds; soft, non-tender; no palpable masses, no distension Ext:    No edema; adequate  peripheral perfusion Skin:      Warm and dry; no rash Neuro: alert and oriented x 3 Psych: normal mood and affect  Data Reviewed: FENO 02/08/2018-17  CT angio 07/27/2017- no pulmonary embolism, bronchial wall thickening.  I have reviewed the images personally.  Assessment:  Evaluation for asthma Symptoms are stable on Symbicort.  Continue the same, use albuterol as needed Check CBC differential, blood allergy profile Schedule pulmonary function test for work-up of asthma.  Plan/Recommendations: - Continue Symbicort, albuterol as needed - CBC differential, blood allergy profile, PFTs.  Marshell Garfinkel MD [facility] 02/08/2018, 11:43 AM  CC: [name], MD

## 2018-02-09 LAB — RESPIRATORY ALLERGY PROFILE REGION II ~~LOC~~
Allergen, A. alternata, m6: <0.1 kU/L
Allergen, Cedar tree, t12: <0.1 kU/L
Allergen, Comm Silver Birch, t9: <0.1 kU/L
Allergen, D pternoyssinus,d7: 0.11 kU/L — ABNORMAL HIGH
Allergen, Mulberry, t76: <0.1 kU/L
Allergen, P. notatum, m1: <0.1 kU/L
Aspergillus fumigatus, m3: <0.1 kU/L
CLADOSPORIUM HERBARUM (M2) IGE: <0.1 kU/L
CLASS: 0
CLASS: 0
CLASS: 0
CLASS: 0
CLASS: 0
CLASS: 0
CLASS: 0
CLASS: 0
CLASS: 0
COMMON RAGWEED (SHORT) (W1) IGE: <0.1 kU/L
Cat Dander: <0.1 kU/L
Class: 0
Class: 0
Class: 0
Class: 0
Class: 0
Class: 0
Class: 0
Class: 0
Class: 0
Class: 0
Class: 0
Class: 0
Class: 0
Class: 0
Class: 0
D. FARINAE: 0.1 kU/L — AB
Dog Dander: <0.1 kU/L
IGE (IMMUNOGLOBULIN E), SERUM: 24 kU/L (ref <=114)
Pecan/Hickory Tree IgE: <0.1 kU/L

## 2018-02-09 LAB — INTERPRETATION:

## 2018-02-10 ENCOUNTER — Encounter: Payer: Self-pay | Admitting: Pulmonary Disease

## 2018-02-22 DIAGNOSIS — F4322 Adjustment disorder with anxiety: Secondary | ICD-10-CM | POA: Diagnosis not present

## 2018-02-22 DIAGNOSIS — J3089 Other allergic rhinitis: Secondary | ICD-10-CM | POA: Diagnosis not present

## 2018-02-22 DIAGNOSIS — J301 Allergic rhinitis due to pollen: Secondary | ICD-10-CM | POA: Diagnosis not present

## 2018-02-25 DIAGNOSIS — J3089 Other allergic rhinitis: Secondary | ICD-10-CM | POA: Diagnosis not present

## 2018-02-25 DIAGNOSIS — J301 Allergic rhinitis due to pollen: Secondary | ICD-10-CM | POA: Diagnosis not present

## 2018-03-08 DIAGNOSIS — J3089 Other allergic rhinitis: Secondary | ICD-10-CM | POA: Diagnosis not present

## 2018-03-08 DIAGNOSIS — J301 Allergic rhinitis due to pollen: Secondary | ICD-10-CM | POA: Diagnosis not present

## 2018-03-17 DIAGNOSIS — Z1283 Encounter for screening for malignant neoplasm of skin: Secondary | ICD-10-CM | POA: Diagnosis not present

## 2018-03-17 DIAGNOSIS — L821 Other seborrheic keratosis: Secondary | ICD-10-CM | POA: Diagnosis not present

## 2018-03-17 DIAGNOSIS — L281 Prurigo nodularis: Secondary | ICD-10-CM | POA: Diagnosis not present

## 2018-03-18 DIAGNOSIS — I10 Essential (primary) hypertension: Secondary | ICD-10-CM | POA: Diagnosis not present

## 2018-03-18 DIAGNOSIS — J3089 Other allergic rhinitis: Secondary | ICD-10-CM | POA: Diagnosis not present

## 2018-03-18 DIAGNOSIS — Z9889 Other specified postprocedural states: Secondary | ICD-10-CM | POA: Diagnosis not present

## 2018-03-18 DIAGNOSIS — J301 Allergic rhinitis due to pollen: Secondary | ICD-10-CM | POA: Diagnosis not present

## 2018-03-18 DIAGNOSIS — F4329 Adjustment disorder with other symptoms: Secondary | ICD-10-CM | POA: Diagnosis not present

## 2018-03-18 DIAGNOSIS — G4730 Sleep apnea, unspecified: Secondary | ICD-10-CM | POA: Diagnosis not present

## 2018-03-18 DIAGNOSIS — R739 Hyperglycemia, unspecified: Secondary | ICD-10-CM | POA: Diagnosis not present

## 2018-03-18 DIAGNOSIS — E039 Hypothyroidism, unspecified: Secondary | ICD-10-CM | POA: Diagnosis not present

## 2018-03-18 DIAGNOSIS — M62838 Other muscle spasm: Secondary | ICD-10-CM | POA: Diagnosis not present

## 2018-03-18 DIAGNOSIS — Z23 Encounter for immunization: Secondary | ICD-10-CM | POA: Diagnosis not present

## 2018-03-18 DIAGNOSIS — E781 Pure hyperglyceridemia: Secondary | ICD-10-CM | POA: Diagnosis not present

## 2018-03-18 DIAGNOSIS — E871 Hypo-osmolality and hyponatremia: Secondary | ICD-10-CM | POA: Diagnosis not present

## 2018-04-07 DIAGNOSIS — J3089 Other allergic rhinitis: Secondary | ICD-10-CM | POA: Diagnosis not present

## 2018-04-07 DIAGNOSIS — J301 Allergic rhinitis due to pollen: Secondary | ICD-10-CM | POA: Diagnosis not present

## 2018-04-12 ENCOUNTER — Encounter: Payer: Self-pay | Admitting: Pulmonary Disease

## 2018-04-12 ENCOUNTER — Ambulatory Visit (INDEPENDENT_AMBULATORY_CARE_PROVIDER_SITE_OTHER): Payer: Medicare Other | Admitting: Pulmonary Disease

## 2018-04-12 VITALS — BP 128/82 | HR 78 | Ht 63.0 in | Wt 197.0 lb

## 2018-04-12 DIAGNOSIS — J45909 Unspecified asthma, uncomplicated: Secondary | ICD-10-CM

## 2018-04-12 LAB — PULMONARY FUNCTION TEST
DL/VA % pred: 92 %
DL/VA: 4.34 ml/min/mmHg/L
DLCO COR % PRED: 83 %
DLCO COR: 19.07 ml/min/mmHg
DLCO UNC % PRED: 80 %
DLCO unc: 18.4 ml/min/mmHg
FEF 25-75 POST: 2.1 L/s
FEF 25-75 PRE: 1.27 L/s
FEF2575-%Change-Post: 65 %
FEF2575-%PRED-PRE: 66 %
FEF2575-%Pred-Post: 110 %
FEV1-%Change-Post: 13 %
FEV1-%PRED-POST: 91 %
FEV1-%PRED-PRE: 80 %
FEV1-POST: 2.02 L
FEV1-Pre: 1.77 L
FEV1FVC-%CHANGE-POST: 0 %
FEV1FVC-%Pred-Pre: 98 %
FEV6-%CHANGE-POST: 14 %
FEV6-%PRED-POST: 97 %
FEV6-%PRED-PRE: 85 %
FEV6-Post: 2.73 L
FEV6-Pre: 2.38 L
FEV6FVC-%Pred-Post: 104 %
FEV6FVC-%Pred-Pre: 104 %
FVC-%Change-Post: 14 %
FVC-%PRED-POST: 93 %
FVC-%Pred-Pre: 81 %
FVC-Post: 2.73 L
FVC-Pre: 2.38 L
POST FEV6/FVC RATIO: 100 %
PRE FEV1/FVC RATIO: 75 %
Post FEV1/FVC ratio: 74 %
Pre FEV6/FVC Ratio: 100 %
RV % pred: 106 %
RV: 2.25 L
TLC % PRED: 94 %
TLC: 4.63 L

## 2018-04-12 LAB — NITRIC OXIDE: NITRIC OXIDE: 19

## 2018-04-12 NOTE — Progress Notes (Signed)
PFT done today. 

## 2018-04-12 NOTE — Patient Instructions (Signed)
Continue the Symbicort, albuterol as needed Reduce environmental exposures at home Use Protonix every day Follow-up in 3 months.

## 2018-04-12 NOTE — Progress Notes (Signed)
Becky Davis    [id number]    Jul 10, 1951  Primary Care Physician:Wong, Edwyna Shell, MD  Referring Physician: [name], MD [address] [place], [place] [address]  Chief complaint: Follow-up for asthma  HPI: 67 year old with hypertension, asthma, allergies, sleep apnea, thyroidectomy Follows with [facility] and is on immunotherapy, allergy shots.  She was seen at her allergy office in early July for exacerbation given a prednisone taper.  Prior to that he was seen at [facility] on ED in January 2019 for intermittent hemoptysis, asthma exacerbation treated with azithromycin and steroids.  CT did not show pulmonary embolism, lung mass or nodule.  Maintained on Symbicort, singular, albuterol as needed.  Has daily symptoms with dyspnea with activity, nighttime awakenings.  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  Exposures: Reports remote asbestos exposure.  No mold, hot tub, Jacuzzi. Smoking history: Never smoker Travel history: Lived in [place] all her life.  Vacation travel to [place], [place].   Interim history: Travel to [place] over the past few months and reports that her breathing is doing well.  After returning home she has noticed increasing dyspnea, nighttime awakenings, using her albuterol every day  Outpatient Encounter Medications as of 04/12/2018  Medication Sig  . albuterol (PROVENTIL) (2.5 MG/3ML) 0.083% nebulizer solution Take 2.5 mg by nebulization every 4 (four) hours as needed for wheezing or shortness of breath.  . Albuterol Sulfate (PROAIR HFA IN) Inhale into the lungs.  . ARIPiprazole (ABILIFY) 10 MG tablet Take 10 mg by mouth daily.  . budesonide-formoterol (SYMBICORT) 80-4.5 MCG/ACT inhaler Inhale 2 puffs into the lungs 2 (two) times daily.  Marland Kitchen levothyroxine (SYNTHROID, LEVOTHROID) 100 MCG tablet Take 100 mcg by mouth daily before breakfast.  . meloxicam (MOBIC) 15 MG tablet Take 15 mg by mouth daily  as needed for pain.  . montelukast (SINGULAIR) 10 MG tablet Take 10 mg by mouth at bedtime.  . pantoprazole (PROTONIX) 40 MG tablet Take 40 mg by mouth daily.  Marland Kitchen PARoxetine (PAXIL) 20 MG tablet Take 20 mg by mouth daily.  . ranitidine (ZANTAC) 300 MG capsule Take 300 mg by mouth every evening.  . [DISCONTINUED] Multiple Vitamins-Minerals (CENTRUM) tablet Take 1 tablet by mouth daily.   No facility-administered encounter medications on file as of 04/12/2018.    Data Reviewed: Imaging CT angio 07/27/2017- no pulmonary embolism, bronchial wall thickening.  I have reviewed the images personally.  PFTs 04/12/18 FVC 2.73 [93%), FEV1 2.02 [91%), F/F 74, TLC 94%, DLCO 80% Minimal obstruction with bronchodilator response.  FENO 02/08/2018-17 FENO 04/12/2018-90  Labs CBC 01/19/2018-WBC 7.7, eos 2.1%, absolute eosinophil count 162 Blood allergy profile 01/19/2018-IgE 24, sensitive to dust mite.  Assessment:  Moderate persistent asthma She is on Symbicort, albuterol as needed Suspect that she may have exposures at home that is making her symptoms worse as wheezing improved when she is away from home Blood allergy profile noted for sensitivity to dust mite Asked her to maintain a clean house, reduce environmental exposures.  Discussed increase of Symbicort to 160 but she would like to continue at current dose since she does not want to take too much steroids. Continue Singulair  GERD Encouraged to use Protonix every day since she forgets to use it on most days.  Plan/Recommendations: - Continue Symbicort, albuterol as needed - Continue Protonix.  Marshell Garfinkel MD [facility] 04/12/2018, 11:18 AM  CC: [name],  MD   

## 2018-04-15 DIAGNOSIS — F4323 Adjustment disorder with mixed anxiety and depressed mood: Secondary | ICD-10-CM | POA: Diagnosis not present

## 2018-04-16 DIAGNOSIS — E039 Hypothyroidism, unspecified: Secondary | ICD-10-CM | POA: Diagnosis not present

## 2018-04-16 DIAGNOSIS — E041 Nontoxic single thyroid nodule: Secondary | ICD-10-CM | POA: Diagnosis not present

## 2018-04-16 DIAGNOSIS — R7309 Other abnormal glucose: Secondary | ICD-10-CM | POA: Diagnosis not present

## 2018-04-21 DIAGNOSIS — J3089 Other allergic rhinitis: Secondary | ICD-10-CM | POA: Diagnosis not present

## 2018-04-21 DIAGNOSIS — J301 Allergic rhinitis due to pollen: Secondary | ICD-10-CM | POA: Diagnosis not present

## 2018-04-23 DIAGNOSIS — M542 Cervicalgia: Secondary | ICD-10-CM | POA: Diagnosis not present

## 2018-04-23 DIAGNOSIS — M25512 Pain in left shoulder: Secondary | ICD-10-CM | POA: Diagnosis not present

## 2018-04-26 ENCOUNTER — Ambulatory Visit
Admission: RE | Admit: 2018-04-26 | Discharge: 2018-04-26 | Disposition: A | Discharge status: Home / Self Care | Payer: Medicare Other | Source: Ambulatory Visit | Attending: Internal Medicine | Admitting: Internal Medicine

## 2018-04-26 DIAGNOSIS — E041 Nontoxic single thyroid nodule: Secondary | ICD-10-CM

## 2018-04-26 DIAGNOSIS — E890 Postprocedural hypothyroidism: Secondary | ICD-10-CM | POA: Diagnosis not present

## 2018-05-05 DIAGNOSIS — J301 Allergic rhinitis due to pollen: Secondary | ICD-10-CM | POA: Diagnosis not present

## 2018-05-05 DIAGNOSIS — J3089 Other allergic rhinitis: Secondary | ICD-10-CM | POA: Diagnosis not present

## 2018-05-10 DIAGNOSIS — F4322 Adjustment disorder with anxiety: Secondary | ICD-10-CM | POA: Diagnosis not present

## 2018-05-17 DIAGNOSIS — J3089 Other allergic rhinitis: Secondary | ICD-10-CM | POA: Diagnosis not present

## 2018-05-17 DIAGNOSIS — J301 Allergic rhinitis due to pollen: Secondary | ICD-10-CM | POA: Diagnosis not present

## 2018-05-31 DIAGNOSIS — J3089 Other allergic rhinitis: Secondary | ICD-10-CM | POA: Diagnosis not present

## 2018-05-31 DIAGNOSIS — J301 Allergic rhinitis due to pollen: Secondary | ICD-10-CM | POA: Diagnosis not present

## 2018-06-02 DIAGNOSIS — J301 Allergic rhinitis due to pollen: Secondary | ICD-10-CM | POA: Diagnosis not present

## 2018-06-02 DIAGNOSIS — J3089 Other allergic rhinitis: Secondary | ICD-10-CM | POA: Diagnosis not present

## 2018-06-07 DIAGNOSIS — J301 Allergic rhinitis due to pollen: Secondary | ICD-10-CM | POA: Diagnosis not present

## 2018-06-07 DIAGNOSIS — J3089 Other allergic rhinitis: Secondary | ICD-10-CM | POA: Diagnosis not present

## 2018-06-09 DIAGNOSIS — J3089 Other allergic rhinitis: Secondary | ICD-10-CM | POA: Diagnosis not present

## 2018-06-09 DIAGNOSIS — J301 Allergic rhinitis due to pollen: Secondary | ICD-10-CM | POA: Diagnosis not present

## 2018-06-16 DIAGNOSIS — J301 Allergic rhinitis due to pollen: Secondary | ICD-10-CM | POA: Diagnosis not present

## 2018-06-16 DIAGNOSIS — F4322 Adjustment disorder with anxiety: Secondary | ICD-10-CM | POA: Diagnosis not present

## 2018-06-16 DIAGNOSIS — J3089 Other allergic rhinitis: Secondary | ICD-10-CM | POA: Diagnosis not present

## 2018-06-23 DIAGNOSIS — J3089 Other allergic rhinitis: Secondary | ICD-10-CM | POA: Diagnosis not present

## 2018-06-23 DIAGNOSIS — J069 Acute upper respiratory infection, unspecified: Secondary | ICD-10-CM | POA: Diagnosis not present

## 2018-06-23 DIAGNOSIS — J301 Allergic rhinitis due to pollen: Secondary | ICD-10-CM | POA: Diagnosis not present

## 2018-06-23 DIAGNOSIS — J4540 Moderate persistent asthma, uncomplicated: Secondary | ICD-10-CM | POA: Diagnosis not present

## 2018-06-30 ENCOUNTER — Ambulatory Visit (INDEPENDENT_AMBULATORY_CARE_PROVIDER_SITE_OTHER): Payer: Medicare Other | Admitting: Pulmonary Disease

## 2018-06-30 ENCOUNTER — Encounter: Payer: Self-pay | Admitting: Pulmonary Disease

## 2018-06-30 VITALS — BP 140/80 | HR 89 | Temp 98.1°F | Ht 63.0 in | Wt 191.6 lb

## 2018-06-30 DIAGNOSIS — R0982 Postnasal drip: Secondary | ICD-10-CM | POA: Diagnosis not present

## 2018-06-30 DIAGNOSIS — J45909 Unspecified asthma, uncomplicated: Secondary | ICD-10-CM

## 2018-06-30 LAB — CBC WITH DIFFERENTIAL/PLATELET
BASOS PCT: 0.6 % (ref 0.0–3.0)
Basophils Absolute: 0.1 10*3/uL (ref 0.0–0.1)
EOS ABS: 0.2 10*3/uL (ref 0.0–0.7)
EOS PCT: 2.5 % (ref 0.0–5.0)
HCT: 37.4 % (ref 36.0–46.0)
Hemoglobin: 13.2 g/dL (ref 12.0–15.0)
LYMPHS ABS: 2.3 10*3/uL (ref 0.7–4.0)
Lymphocytes Relative: 26.7 % (ref 12.0–46.0)
MCHC: 35.4 g/dL (ref 30.0–36.0)
MCV: 84.9 fl (ref 78.0–100.0)
MONO ABS: 0.7 10*3/uL (ref 0.1–1.0)
Monocytes Relative: 8.5 % (ref 3.0–12.0)
NEUTROS PCT: 61.7 % (ref 43.0–77.0)
Neutro Abs: 5.4 10*3/uL (ref 1.4–7.7)
Platelets: 317 10*3/uL (ref 150.0–400.0)
RBC: 4.4 Mil/uL (ref 3.87–5.11)
RDW: 13.4 % (ref 11.5–15.5)
WBC: 8.8 10*3/uL (ref 4.0–10.5)

## 2018-06-30 MED ORDER — AZITHROMYCIN 250 MG PO TABS: ORAL_TABLET | ORAL | 0 refills | Status: AC

## 2018-06-30 MED ORDER — PREDNISONE 10 MG PO TABS: ORAL_TABLET | ORAL | 0 refills | Status: DC

## 2018-06-30 NOTE — Addendum Note (Signed)
Addended by: Suzzanne Cloud E on: 06/30/2018 10:54 AM   Modules accepted: Orders

## 2018-06-30 NOTE — Patient Instructions (Addendum)
I am sorry you not feeling well We will give you a Z-Pak and prednisone taper starting at 40 mg.  Reduce dose by 10 mg every 3 days Check a CBC with differential  We will order a CT of the sinuses to evaluate your sinusitis Follow-up in 2 to 4 weeks.

## 2018-06-30 NOTE — Progress Notes (Signed)
Becky Davis    [id number]    1951-04-07  Primary Care Physician:Wong, Edwyna Shell, MD  Referring Physician: [name], MD [address] [place], [place] [address]  Chief complaint: Follow-up for asthma  HPI: 67 year old with hypertension, asthma, allergies, sleep apnea, thyroidectomy Follows with [facility] and is on immunotherapy, allergy shots.  She was seen at her allergy office in early July for exacerbation given a prednisone taper.  Prior to that he was seen at [facility] on ED in January 2019 for intermittent hemoptysis, asthma exacerbation treated with azithromycin and steroids.  CT did not show pulmonary embolism, lung mass or nodule.  Maintained on Symbicort, singular, albuterol as needed.  Has daily symptoms with dyspnea with activity, nighttime awakenings.  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  Exposures: Reports remote asbestos exposure.  No mold, hot tub, Jacuzzi. Smoking history: Never smoker Travel history: Lived in [place] all her life.  Vacation travel to [place], [place].   Interim history: Complains of worsening dyspnea wheezing, sinus pressure, congestion with postnasal drip She was awake the whole night due to problems with breathing.  Outpatient Encounter Medications as of 06/30/2018  Medication Sig  . albuterol (PROVENTIL) (2.5 MG/3ML) 0.083% nebulizer solution Take 2.5 mg by nebulization every 4 (four) hours as needed for wheezing or shortness of breath.  . Albuterol Sulfate (PROAIR HFA IN) Inhale into the lungs.  . ARIPiprazole (ABILIFY) 10 MG tablet Take 10 mg by mouth daily.  . budesonide-formoterol (SYMBICORT) 80-4.5 MCG/ACT inhaler Inhale 2 puffs into the lungs 2 (two) times daily.  Marland Kitchen levothyroxine (SYNTHROID, LEVOTHROID) 100 MCG tablet Take 100 mcg by mouth daily before breakfast.  . meloxicam (MOBIC) 15 MG tablet Take 15 mg by mouth daily as needed for pain.  . montelukast (SINGULAIR)  10 MG tablet Take 10 mg by mouth at bedtime.  . pantoprazole (PROTONIX) 40 MG tablet Take 40 mg by mouth daily.  Marland Kitchen PARoxetine (PAXIL) 20 MG tablet Take 20 mg by mouth daily.  . [DISCONTINUED] ranitidine (ZANTAC) 300 MG capsule Take 300 mg by mouth every evening.   No facility-administered encounter medications on file as of 06/30/2018.    Physical Exam: Blood pressure 140/80, pulse 89, temperature 98.1 F (36.7 C), temperature source Oral, height 5\' 3"  (1.6 m), weight 191 lb 9.6 oz (86.9 kg), SpO2 94 %. Gen:      No acute distress HEENT:  EOMI, sclera anicteric Neck:     No masses; no thyromegaly Lungs:    Diffuse expiratory wheeze, scattered crackles. CV:         Regular rate and rhythm; no murmurs Abd:      + bowel sounds; soft, non-tender; no palpable masses, no distension Ext:    No edema; adequate peripheral perfusion Skin:      Warm and dry; no rash Neuro: alert and oriented x 3 Psych: normal mood and affect  Data Reviewed: Imaging CT angio 07/27/2017- no pulmonary embolism, bronchial wall thickening.  I have reviewed the images personally.  PFTs 04/12/18 FVC 2.73 [93%), FEV1 2.02 [91%), F/F 74, TLC 94%, DLCO 80% Minimal obstruction with bronchodilator response.  FENO 02/08/2018-17 FENO 04/12/2018-90  Labs CBC 01/19/2018-WBC 7.7, eos 2.1%, absolute eosinophil count 162 Blood allergy profile 01/19/2018-IgE 24, sensitive to dust mite.  Assessment:  Severe persistent asthma Seen today for asthma exacerbation with acute sinusitis We will treat her with a Z-Pak, azithromycin. Get CT scan  of the sinuses, CBC with differential  This is her third exacerbation this year requiring prednisone.  She may benefit from initiation of a biologic however she is not interested in it at present.  Reassess at return visit.  Allergic rhinitis Continue immunotherapy, Singulair  GERD Protonix  Plan/Recommendations: - Z-Pak, prednisone taper - CT sinuses, CBC - Continue Symbicort, albuterol  as needed - Continue Protonix.  Marshell Garfinkel MD [facility] 06/30/2018, 10:27 AM  CC: [name], MD

## 2018-07-02 ENCOUNTER — Ambulatory Visit (INDEPENDENT_AMBULATORY_CARE_PROVIDER_SITE_OTHER)
Admission: RE | Admit: 2018-07-02 | Discharge: 2018-07-02 | Disposition: A | Discharge status: Home / Self Care | Payer: Medicare Other | Source: Ambulatory Visit | Attending: Pulmonary Disease | Admitting: Pulmonary Disease

## 2018-07-02 DIAGNOSIS — R0982 Postnasal drip: Secondary | ICD-10-CM | POA: Diagnosis not present

## 2018-07-05 ENCOUNTER — Telehealth: Payer: Self-pay | Admitting: Pulmonary Disease

## 2018-07-05 NOTE — Telephone Encounter (Signed)
Please have her drop down to 10 mg daily x 7 days starting tomorrow and then call the office next week to see how she is doing. If she is still having trouble, then she should be seen then. Thanks

## 2018-07-05 NOTE — Telephone Encounter (Signed)
Called and spoke with pt letting her know to decrease pred down to 1 tablet for the next week to see if that would help. Stated to pt to call our office next week so we can see how she is doing. Pt expressed understanding. Nothing further needed.

## 2018-07-05 NOTE — Telephone Encounter (Signed)
Spoke with pt. States that she is having a reaction to Prednisone. Reports that her family is telling her that she is being loud and being maniac. States that she is currently taking 3 tablets daily of her taper. She would like recommendations on what to do?  Judson Roch - please advise. Thanks.

## 2018-07-09 DIAGNOSIS — J301 Allergic rhinitis due to pollen: Secondary | ICD-10-CM | POA: Diagnosis not present

## 2018-07-09 DIAGNOSIS — J3089 Other allergic rhinitis: Secondary | ICD-10-CM | POA: Diagnosis not present

## 2018-07-12 ENCOUNTER — Telehealth: Payer: Self-pay | Admitting: Pulmonary Disease

## 2018-07-12 NOTE — Telephone Encounter (Signed)
Spoke with pt, states that she decreased prednisone as directed by SG last week (see 12/23 phone note), and her symptoms have resolved.    Nothing further needed.  Forwarding to Dr. Vaughan Browner as Juluis Rainier.

## 2018-07-19 DIAGNOSIS — J3089 Other allergic rhinitis: Secondary | ICD-10-CM | POA: Diagnosis not present

## 2018-07-19 DIAGNOSIS — J301 Allergic rhinitis due to pollen: Secondary | ICD-10-CM | POA: Diagnosis not present

## 2018-07-21 DIAGNOSIS — F4323 Adjustment disorder with mixed anxiety and depressed mood: Secondary | ICD-10-CM | POA: Diagnosis not present

## 2018-07-24 IMAGING — CR DG CHEST 2V
2 series · 2 of 2 positions shown · non-contrast
Comparison: No recent .

CLINICAL DATA: Thyroid lobectomy.  Cough.

EXAM:
CHEST  2 VIEW

[w chest pa]
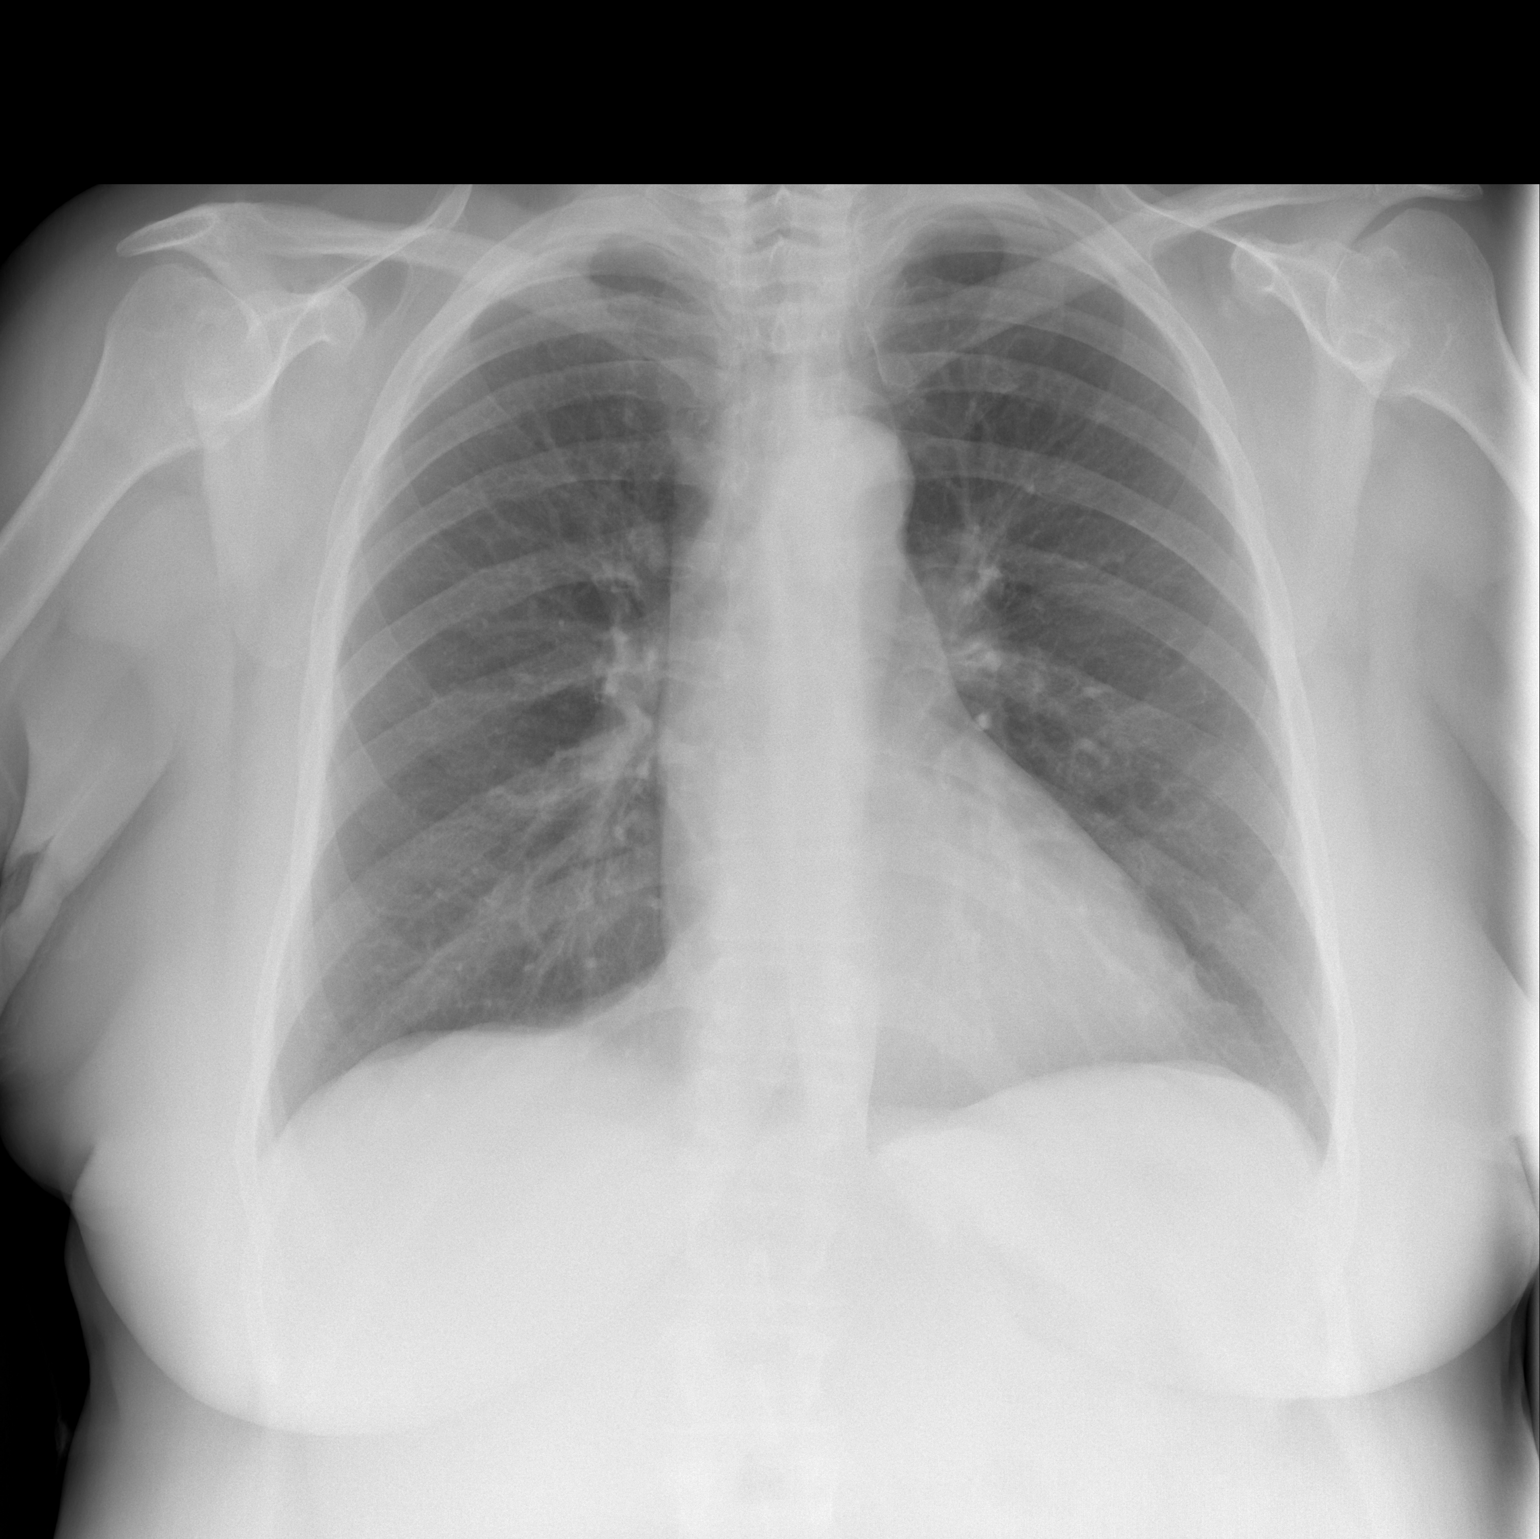

[w chest lat]
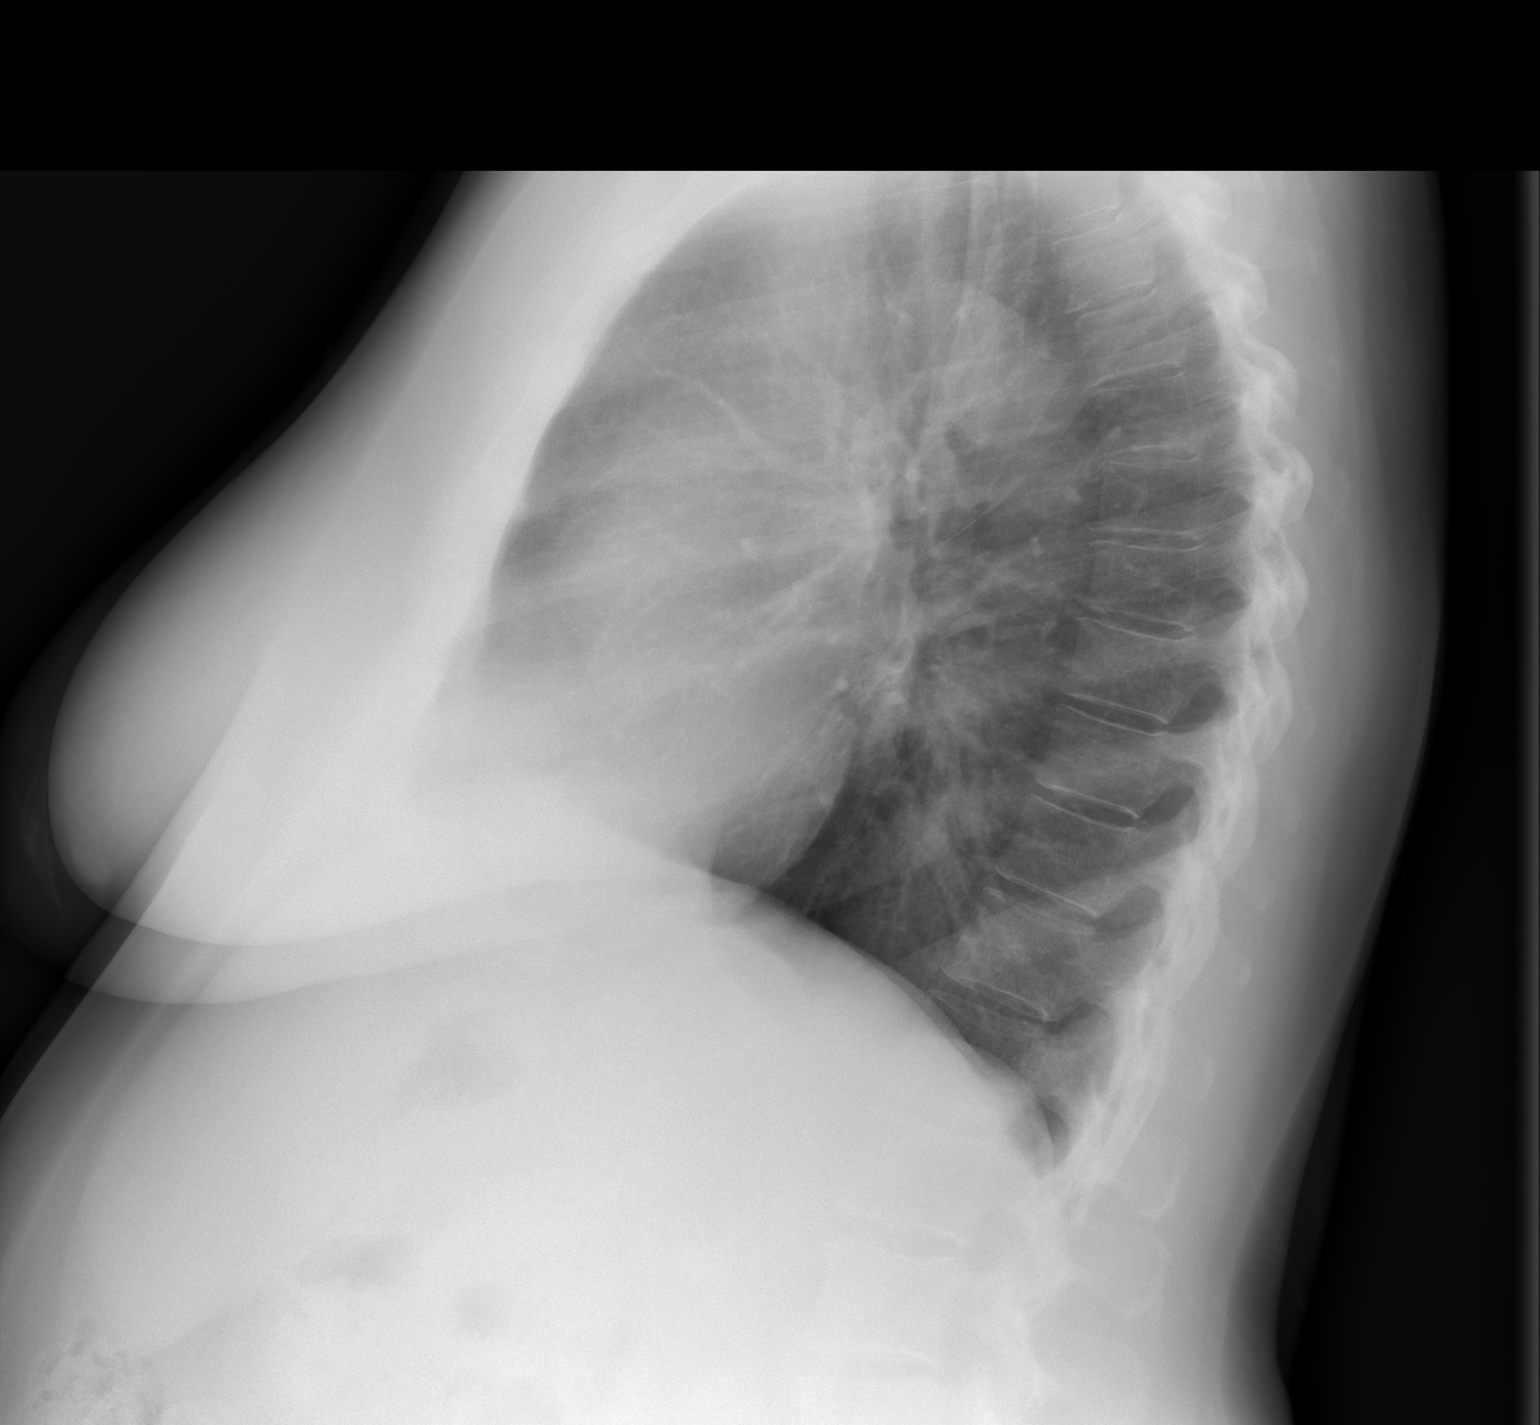

[2 of 2 positions shown; findings below may reference images not displayed]

FINDINGS: Mediastinum and hilar structures normal. Questionable tiny nodule
noted over the left upper lobe. Repeat PA and lateral chest x-ray
suggested. If this nodular density persists nonenhanced chest CT can
be obtained for further evaluation. No acute infiltrate. No pleural
effusion or pneumothorax . No pleural effusion or pneumothorax. No
acute bony abnormality.
IMPRESSION: 1. Questionable tiny nodule noted of the left upper lobe. Repeat PA
lateral chest x-ray suggested. If this nodular density persists
nonenhanced chest CT can be obtained for further evaluation . Exam
otherwise unremarkable.

## 2018-07-26 DIAGNOSIS — J301 Allergic rhinitis due to pollen: Secondary | ICD-10-CM | POA: Diagnosis not present

## 2018-07-26 DIAGNOSIS — J3089 Other allergic rhinitis: Secondary | ICD-10-CM | POA: Diagnosis not present

## 2018-07-28 DIAGNOSIS — J301 Allergic rhinitis due to pollen: Secondary | ICD-10-CM | POA: Diagnosis not present

## 2018-07-28 DIAGNOSIS — J3089 Other allergic rhinitis: Secondary | ICD-10-CM | POA: Diagnosis not present

## 2018-08-06 ENCOUNTER — Ambulatory Visit (INDEPENDENT_AMBULATORY_CARE_PROVIDER_SITE_OTHER): Payer: Medicare Other | Admitting: Pulmonary Disease

## 2018-08-06 ENCOUNTER — Encounter: Payer: Self-pay | Admitting: Pulmonary Disease

## 2018-08-06 VITALS — BP 136/62 | HR 71 | Ht 63.0 in | Wt 190.0 lb

## 2018-08-06 DIAGNOSIS — J45909 Unspecified asthma, uncomplicated: Secondary | ICD-10-CM

## 2018-08-06 LAB — NITRIC OXIDE: Nitric Oxide: 13

## 2018-08-06 MED ORDER — BUDESONIDE-FORMOTEROL FUMARATE 160-4.5 MCG/ACT IN AERO: 2.0000 | INHALATION_SPRAY | Freq: Two times a day (BID) | RESPIRATORY_TRACT | 6 refills | Status: DC

## 2018-08-06 NOTE — Patient Instructions (Signed)
We will increase your Symbicort 160/4.5 Continue the Singulair, albuterol as needed Follow-up in 3 months.

## 2018-08-06 NOTE — Progress Notes (Signed)
ALYRICA Davis    [id number]    Dec 05, 1950  Primary Care Physician:Wong, Edwyna Shell, MD  Referring Physician: [name], MD [address] [place], [place] [address]  Chief complaint: Follow-up for asthma  HPI: 68 year old with hypertension, asthma, allergies, sleep apnea, thyroidectomy Follows with [facility] and is on immunotherapy, allergy shots.  She was seen at her allergy office in early July for exacerbation given a prednisone taper.  Prior to that he was seen at [facility] on ED in January 2019 for intermittent hemoptysis, asthma exacerbation treated with azithromycin and steroids.  CT did not show pulmonary embolism, lung mass or nodule.  Maintained on Symbicort, singular, albuterol as needed.  Has daily symptoms with dyspnea with activity, nighttime awakenings.  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  Exposures: Reports remote asbestos exposure.  No mold, hot tub, Jacuzzi. Smoking history: Never smoker Travel history: Lived in [place] all her life.  Vacation travel to [place], [place].  ACQ score 08/06/18- 2.5  Interim history: Received a course of steroids in December for asthma exacerbation.  States that her breathing is now back to normal Continues on Symbicort.  Does not need to use her pro-air regularly.  No nocturnal awakenings  Outpatient Encounter Medications as of 08/06/2018  Medication Sig  . albuterol (PROVENTIL) (2.5 MG/3ML) 0.083% nebulizer solution Take 2.5 mg by nebulization every 4 (four) hours as needed for wheezing or shortness of breath.  . Albuterol Sulfate (PROAIR HFA IN) Inhale into the lungs.  . ARIPiprazole (ABILIFY) 10 MG tablet Take 10 mg by mouth daily.  . budesonide-formoterol (SYMBICORT) 80-4.5 MCG/ACT inhaler Inhale 2 puffs into the lungs 2 (two) times daily.  Marland Kitchen levothyroxine (SYNTHROID, LEVOTHROID) 100 MCG tablet Take 100 mcg by mouth daily before breakfast.  . meloxicam (MOBIC) 15  MG tablet Take 15 mg by mouth daily as needed for pain.  . montelukast (SINGULAIR) 10 MG tablet Take 10 mg by mouth at bedtime.  Marland Kitchen PARoxetine (PAXIL) 20 MG tablet Take 20 mg by mouth daily.  . [DISCONTINUED] pantoprazole (PROTONIX) 40 MG tablet Take 40 mg by mouth daily.  . [DISCONTINUED] predniSONE (DELTASONE) 10 MG tablet 4 tabs x 3 days, 3 tabs x 3 days, 2 tabs x 3 days, 1 tab x 3 days then stop (Patient not taking: Reported on 08/06/2018)   No facility-administered encounter medications on file as of 08/06/2018.    Physical Exam: Blood pressure 136/62, pulse 71, height 5\' 3"  (1.6 m), weight 190 lb (86.2 kg), SpO2 94 %. Gen:      No acute distress HEENT:  EOMI, sclera anicteric Neck:     No masses; no thyromegaly Lungs:    Clear to auscultation bilaterally; normal respiratory effort CV:         Regular rate and rhythm; no murmurs Abd:      + bowel sounds; soft, non-tender; no palpable masses, no distension Ext:    No edema; adequate peripheral perfusion Skin:      Warm and dry; no rash Neuro: alert and oriented x 3 Psych: normal mood and affect  Data Reviewed: Imaging CT angio 07/27/2017- no pulmonary embolism, bronchial wall thickening. CT sinus 07/02/2018-mild mucosal thickening in the paranasal sinus. I have reviewed the images personally.  PFTs 04/12/18 FVC 2.73 [93%), FEV1 2.02 [91%), F/F 74, TLC 94%, DLCO 80% Minimal obstruction with bronchodilator response.  FENO 02/08/2018-17 FENO 04/12/2018-90 FENO 08/06/2018-13  Labs CBC  01/19/2018-WBC 7.7, eos 2.1%, absolute eosinophil count 162 CBC 06/30/2018-WBC 88, eos 2.5%, absolute eosinophil count 220 Blood allergy profile 01/19/2018-IgE 24, sensitive to dust mite.  Assessment:  Severe persistent asthma So far she is received 3 courses of prednisone in the past year for exacerbation We will increase Symbicort 160/4.5, continue Singulair She may benefit from initiation of dupixent however she is not interested in it at present.   Reassess at return visit.  Allergic rhinitis Continue immunotherapy, Singulair  GERD Protonix  Plan/Recommendations: - Continue Symbicort.  Increase dose to 160/4.5 - Albuterol as needed - Continue Protonix.  Marshell Garfinkel MD [facility] 08/06/2018, 10:52 AM  CC: [name], MD

## 2018-08-09 DIAGNOSIS — J3089 Other allergic rhinitis: Secondary | ICD-10-CM | POA: Diagnosis not present

## 2018-08-09 DIAGNOSIS — J301 Allergic rhinitis due to pollen: Secondary | ICD-10-CM | POA: Diagnosis not present

## 2018-08-18 DIAGNOSIS — J3089 Other allergic rhinitis: Secondary | ICD-10-CM | POA: Diagnosis not present

## 2018-08-18 DIAGNOSIS — J301 Allergic rhinitis due to pollen: Secondary | ICD-10-CM | POA: Diagnosis not present

## 2018-08-21 DIAGNOSIS — Z79899 Other long term (current) drug therapy: Secondary | ICD-10-CM | POA: Diagnosis not present

## 2018-08-21 DIAGNOSIS — R079 Chest pain, unspecified: Secondary | ICD-10-CM | POA: Diagnosis not present

## 2018-08-21 DIAGNOSIS — J45909 Unspecified asthma, uncomplicated: Secondary | ICD-10-CM | POA: Diagnosis not present

## 2018-08-21 DIAGNOSIS — E876 Hypokalemia: Secondary | ICD-10-CM | POA: Diagnosis not present

## 2018-08-21 DIAGNOSIS — Z880 Allergy status to penicillin: Secondary | ICD-10-CM | POA: Diagnosis not present

## 2018-08-21 DIAGNOSIS — E039 Hypothyroidism, unspecified: Secondary | ICD-10-CM | POA: Diagnosis not present

## 2018-08-21 DIAGNOSIS — E871 Hypo-osmolality and hyponatremia: Secondary | ICD-10-CM | POA: Diagnosis not present

## 2018-08-21 DIAGNOSIS — F329 Major depressive disorder, single episode, unspecified: Secondary | ICD-10-CM | POA: Diagnosis not present

## 2018-08-21 DIAGNOSIS — Z743 Need for continuous supervision: Secondary | ICD-10-CM | POA: Diagnosis not present

## 2018-08-21 DIAGNOSIS — G4733 Obstructive sleep apnea (adult) (pediatric): Secondary | ICD-10-CM | POA: Diagnosis not present

## 2018-08-21 DIAGNOSIS — I10 Essential (primary) hypertension: Secondary | ICD-10-CM | POA: Diagnosis not present

## 2018-08-21 DIAGNOSIS — K219 Gastro-esophageal reflux disease without esophagitis: Secondary | ICD-10-CM | POA: Diagnosis not present

## 2018-08-21 DIAGNOSIS — R42 Dizziness and giddiness: Secondary | ICD-10-CM | POA: Diagnosis not present

## 2018-08-21 DIAGNOSIS — F419 Anxiety disorder, unspecified: Secondary | ICD-10-CM | POA: Diagnosis not present

## 2018-08-21 DIAGNOSIS — R55 Syncope and collapse: Secondary | ICD-10-CM | POA: Diagnosis not present

## 2018-08-21 DIAGNOSIS — R9431 Abnormal electrocardiogram [ECG] [EKG]: Secondary | ICD-10-CM | POA: Diagnosis not present

## 2018-08-22 DIAGNOSIS — E876 Hypokalemia: Secondary | ICD-10-CM | POA: Diagnosis not present

## 2018-08-22 DIAGNOSIS — E871 Hypo-osmolality and hyponatremia: Secondary | ICD-10-CM | POA: Diagnosis not present

## 2018-08-22 DIAGNOSIS — R55 Syncope and collapse: Secondary | ICD-10-CM | POA: Diagnosis not present

## 2018-08-22 DIAGNOSIS — G4733 Obstructive sleep apnea (adult) (pediatric): Secondary | ICD-10-CM | POA: Diagnosis not present

## 2018-08-24 DIAGNOSIS — G4733 Obstructive sleep apnea (adult) (pediatric): Secondary | ICD-10-CM | POA: Diagnosis not present

## 2018-08-24 DIAGNOSIS — Z8639 Personal history of other endocrine, nutritional and metabolic disease: Secondary | ICD-10-CM | POA: Diagnosis not present

## 2018-08-24 DIAGNOSIS — F4329 Adjustment disorder with other symptoms: Secondary | ICD-10-CM | POA: Diagnosis not present

## 2018-08-24 DIAGNOSIS — R55 Syncope and collapse: Secondary | ICD-10-CM | POA: Diagnosis not present

## 2018-08-24 DIAGNOSIS — J45909 Unspecified asthma, uncomplicated: Secondary | ICD-10-CM | POA: Diagnosis not present

## 2018-08-24 DIAGNOSIS — I10 Essential (primary) hypertension: Secondary | ICD-10-CM | POA: Diagnosis not present

## 2018-08-24 DIAGNOSIS — E039 Hypothyroidism, unspecified: Secondary | ICD-10-CM | POA: Diagnosis not present

## 2018-08-27 DIAGNOSIS — J3089 Other allergic rhinitis: Secondary | ICD-10-CM | POA: Diagnosis not present

## 2018-08-27 DIAGNOSIS — J301 Allergic rhinitis due to pollen: Secondary | ICD-10-CM | POA: Diagnosis not present

## 2018-09-01 DIAGNOSIS — F4323 Adjustment disorder with mixed anxiety and depressed mood: Secondary | ICD-10-CM | POA: Diagnosis not present

## 2018-09-02 ENCOUNTER — Encounter: Payer: Self-pay | Admitting: Registered"

## 2018-09-02 ENCOUNTER — Encounter: Payer: Medicare Other | Attending: Internal Medicine | Admitting: Registered"

## 2018-09-02 DIAGNOSIS — G4733 Obstructive sleep apnea (adult) (pediatric): Secondary | ICD-10-CM | POA: Diagnosis not present

## 2018-09-02 DIAGNOSIS — Z86010 Personal history of colonic polyps: Secondary | ICD-10-CM | POA: Diagnosis not present

## 2018-09-02 DIAGNOSIS — J45909 Unspecified asthma, uncomplicated: Secondary | ICD-10-CM | POA: Diagnosis not present

## 2018-09-02 DIAGNOSIS — E039 Hypothyroidism, unspecified: Secondary | ICD-10-CM | POA: Diagnosis not present

## 2018-09-02 DIAGNOSIS — Z780 Asymptomatic menopausal state: Secondary | ICD-10-CM | POA: Diagnosis not present

## 2018-09-02 DIAGNOSIS — E669 Obesity, unspecified: Secondary | ICD-10-CM | POA: Diagnosis not present

## 2018-09-02 DIAGNOSIS — R7303 Prediabetes: Secondary | ICD-10-CM | POA: Diagnosis not present

## 2018-09-02 DIAGNOSIS — R55 Syncope and collapse: Secondary | ICD-10-CM | POA: Diagnosis not present

## 2018-09-02 DIAGNOSIS — Z0001 Encounter for general adult medical examination with abnormal findings: Secondary | ICD-10-CM | POA: Diagnosis not present

## 2018-09-02 DIAGNOSIS — Z1159 Encounter for screening for other viral diseases: Secondary | ICD-10-CM | POA: Diagnosis not present

## 2018-09-02 DIAGNOSIS — Z1239 Encounter for other screening for malignant neoplasm of breast: Secondary | ICD-10-CM | POA: Diagnosis not present

## 2018-09-02 DIAGNOSIS — I10 Essential (primary) hypertension: Secondary | ICD-10-CM | POA: Diagnosis not present

## 2018-09-02 NOTE — Progress Notes (Signed)
Patient was seen on 09/02/18 for the Core Session 1 of Diabetes Prevention Program course at [facility]. The following learning objectives were met by the patient during this class:   Learning Objectives:   Be able to explain the purpose and benefits of the National Diabetes Prevention Program.   Be able to describe the events that will take place at every session.   Know the weight loss and physical activity goals established by the [facility] Diabetes Prevention Program.   Know their own individual weight loss and physical activity goals.   Be able to explain the important effect of self-monitoring on behavior change.   Goals:  Record food and beverage intake in "Food and Activity Tracker" over the next week.  Bring completed "Food and Activity Tracker" for session 1 to session 2 next week. Circle the foods or beverages you think are highest in fat and calories in your food tracker. Read the labels on the food you buy, and consider using measuring cups and spoons to help you calculate the amount you eat. We will talk about measuring in more detail in the coming weeks.   Follow-Up Plan:  Attend Core Session 2 next week.   Bring completed "Food and Activity Tracker" next week to be reviewed by Lifestyle Coach.

## 2018-09-07 DIAGNOSIS — J301 Allergic rhinitis due to pollen: Secondary | ICD-10-CM | POA: Diagnosis not present

## 2018-09-07 DIAGNOSIS — J3089 Other allergic rhinitis: Secondary | ICD-10-CM | POA: Diagnosis not present

## 2018-09-10 ENCOUNTER — Encounter (HOSPITAL_BASED_OUTPATIENT_CLINIC_OR_DEPARTMENT_OTHER): Payer: Medicare Other | Admitting: Registered"

## 2018-09-10 ENCOUNTER — Encounter: Payer: Self-pay | Admitting: Registered"

## 2018-09-10 DIAGNOSIS — R7303 Prediabetes: Secondary | ICD-10-CM | POA: Diagnosis not present

## 2018-09-10 NOTE — Progress Notes (Signed)
Patient was seen on 09/10/18 for the Core Session 2 of Diabetes Prevention Program course at [facility]. By the end of this session patients are able to complete the following objectives:   Learning Objectives:  Self-monitor their weight during the weeks following Session 2.   Describe the relationship between fat and calories.   Explain the reason for, and basic principles of, self-monitoring fat grams and calories.   Identify their personal fat gram goals.   Use the ?Fat and Calorie Counter? to calculate the calories and fat grams of a given selection of foods.   Keep a running total of the fat grams they eat each day.   Calculate fat, calories, and serving sizes from nutrition labels.   Goals:   Weigh yourself at the same time each day, or every few days, and record your weight in your Food and Activity Tracker.  Write down everything you eat and drink in your Food and Activity Tracker.  Measure portions as much as you can, and start reading labels.   Use the ?Fat and Calorie Counter? to figure out the amount of fat and calories in what you ate, and write the amount down in your Food and Activity Tracker.  Keep a running fat gram total throughout the day. Come as close to your fat gram goal as you can.   Follow-Up Plan:  Attend Core Session 3 next week.   Bring completed "Food and Activity Tracker" next week to be reviewed by Lifestyle Coach.

## 2018-09-12 NOTE — Progress Notes (Signed)
Patient referred by Becky Shanks, MD for evaluation of presyncope  Subjective:   Becky Davis, female    DOB: 1951-04-21, 68 y.o.   MRN: [id number]   Chief Complaint  Patient presents with  . Loss of Consciousness    Syncope  . New Patient (Initial Visit)    HPI  68 y/o Caucasian female with hypertension. OSA, h/o thyroidectomy, moderate persistent asthma, with syncope.  Patient had an episode of presyncope while in [place] in 07/2018. She had been walking all day playing Casino and had not been drinking enough fluids. That night, she got up to walk to the bathroom and had a syncopal episode. She was helped to stand up by her husband, but she had recurrent episodes of presyncope. She was taken to the ED, was found to be dehydrated and orthostatic positive. EKG and CT head was reportedly normal.  She has not had any recurrence of syncope since that one episode.  She lives in [place] with her husband. Her physical activity was limited due to knee arthritis.  However, she has recently started working with a Insurance underwriter.  She exercises with recumbent bicycle, weight training once a week without any chest pain, shortness of breath, presyncope or syncope symptoms.    Labs 08/24/2018: Glucose 100. BUN/Cr 23/0.79. eGFR 72. Na/K 138/4.1  H/H 13/37, MCV 88, platelets 302   Past Medical History:  Diagnosis Date  . Arthritis    knees  . Asthma   . Headache    hx of 20 years ago  . Heart murmur    hx of 10-15 years ago  . Hypertension   . Hypothyroidism   . Pneumonia   . Pre-diabetes   . Thyroid nodule      Past Surgical History:  Procedure Laterality Date  . ABDOMINAL HYSTERECTOMY    . thyroid goiter     surgery  . THYROID LOBECTOMY     06-25-17 Dr. Harlow Asa   Left  . THYROID LOBECTOMY Left 06/25/2017   Procedure: LEFT THYROID LOBECTOMY;  Surgeon: Armandina Gemma, MD;  Location: [facility];  Service: General;  Laterality: Left;     Social History    Socioeconomic History  . Marital status: Married    Spouse name: Not on file  . Number of children: 3  . Years of education: Not on file  . Highest education level: Not on file  Occupational History  . Not on file  Social Needs  . Financial resource strain: Not on file  . Food insecurity:    Worry: Not on file    Inability: Not on file  . Transportation needs:    Medical: Not on file    Non-medical: Not on file  Tobacco Use  . Smoking status: Never Smoker  . Smokeless tobacco: Never Used  Substance and Sexual Activity  . Alcohol use: No    Frequency: Never  . Drug use: No  . Sexual activity: Not Currently  Lifestyle  . Physical activity:    Days per week: Not on file    Minutes per session: Not on file  . Stress: Not on file  Relationships  . Social connections:    Talks on phone: Not on file    Gets together: Not on file    Attends religious service: Not on file    Active member of club or organization: Not on file    Attends meetings of clubs or organizations: Not on file    Relationship  status: Not on file  . Intimate partner violence:    Fear of current or ex partner: Not on file    Emotionally abused: Not on file    Physically abused: Not on file    Forced sexual activity: Not on file  Other Topics Concern  . Not on file  Social History Narrative  . Not on file     Current Outpatient Medications on File Prior to Visit  Medication Sig Dispense Refill  . albuterol (PROVENTIL) (2.5 MG/3ML) 0.083% nebulizer solution Take 2.5 mg by nebulization every 4 (four) hours as needed for wheezing or shortness of breath.    . Albuterol Sulfate (PROAIR HFA IN) Inhale into the lungs.    . ARIPiprazole (ABILIFY) 10 MG tablet Take 10 mg by mouth daily.    . budesonide-formoterol (SYMBICORT) 160-4.5 MCG/ACT inhaler Inhale 2 puffs into the lungs 2 (two) times daily. 1 Inhaler 6  . levothyroxine (SYNTHROID, LEVOTHROID) 100 MCG tablet Take 100 mcg by mouth daily before  breakfast.    . meloxicam (MOBIC) 15 MG tablet Take 15 mg by mouth daily as needed for pain.    . montelukast (SINGULAIR) 10 MG tablet Take 10 mg by mouth at bedtime.    Marland Kitchen PARoxetine (PAXIL) 20 MG tablet Take 20 mg by mouth daily.    Marland Kitchen triamterene-hydrochlorothiazide (DYAZIDE) 37.5-25 MG capsule Take 1 capsule by mouth every morning.     No current facility-administered medications on file prior to visit.     Cardiovascular studies:  EKG 09/13/2018: Sinus rhythm 68 bpm.  Normal EKG  Review of Systems  Constitution: Negative for decreased appetite, malaise/fatigue, weight gain and weight loss.  HENT: Negative for congestion.   Eyes: Negative for visual disturbance.  Cardiovascular: Positive for near-syncope. Negative for chest pain, claudication, dyspnea on exertion, leg swelling, palpitations and syncope.  Respiratory: Negative for shortness of breath.   Endocrine: Negative for cold intolerance.  Hematologic/Lymphatic: Does not bruise/bleed easily.  Skin: Negative for itching and rash.  Musculoskeletal: Positive for arthritis. Negative for myalgias.  Gastrointestinal: Negative for abdominal pain, nausea and vomiting.  Genitourinary: Negative for dysuria.  Neurological: Positive for light-headedness. Negative for dizziness and weakness.  Psychiatric/Behavioral: Positive for depression. The patient is not nervous/anxious.   All other systems reviewed and are negative.        Vitals:   09/13/18 1135 09/13/18 1138  BP:  111/70  Pulse:  64  SpO2: 96% 95%    Objective:   Physical Exam  Constitutional: She is oriented to person, place, and time. She appears well-developed and well-nourished. No distress.  HENT:  Head: Normocephalic and atraumatic.  Eyes: Pupils are equal, round, and reactive to light. Conjunctivae are normal.  Neck: No JVD present.  Cardiovascular: Normal rate, regular rhythm and intact distal pulses.  No murmur heard. Pulmonary/Chest: Effort normal and  breath sounds normal. She has no wheezes. She has no rales.  Abdominal: Soft. Bowel sounds are normal. There is no rebound.  Musculoskeletal:        General: No edema.  Lymphadenopathy:    She has no cervical adenopathy.  Neurological: She is alert and oriented to person, place, and time. No cranial nerve deficit.  Skin: Skin is warm and dry.  Psychiatric: She has a normal mood and affect.  Nursing note and vitals reviewed.         Assessment & Recommendations:    68 y/o Caucasian female with hypertension, depression OSA, h/o thyroidectomy, moderate persistent asthma, with syncope.  Syncope: Most likely neurocardiogenic related to dehydration and orthostatic hypotension. No recurrence.  Encourage adequate hydration. Will obtain echocardiogram to rule put structural abnormality.  I will see her on as-needed basis, unless significant abnormalities found on echocardiogram.  Thank you for referring the patient to [place]. Please feel free to contact with any questions.  Nigel Mormon, MD [facility] Pager: [phone] Office: [phone] If no answer Cell [phone]

## 2018-09-13 ENCOUNTER — Encounter: Payer: Self-pay | Admitting: Cardiology

## 2018-09-13 ENCOUNTER — Ambulatory Visit (INDEPENDENT_AMBULATORY_CARE_PROVIDER_SITE_OTHER): Payer: Medicare Other | Admitting: Cardiology

## 2018-09-13 VITALS — BP 111/70 | HR 64 | Ht 62.0 in | Wt 187.0 lb

## 2018-09-13 DIAGNOSIS — R55 Syncope and collapse: Secondary | ICD-10-CM | POA: Diagnosis not present

## 2018-09-16 DIAGNOSIS — R55 Syncope and collapse: Secondary | ICD-10-CM | POA: Diagnosis not present

## 2018-09-16 DIAGNOSIS — J45909 Unspecified asthma, uncomplicated: Secondary | ICD-10-CM | POA: Diagnosis not present

## 2018-09-16 DIAGNOSIS — G4733 Obstructive sleep apnea (adult) (pediatric): Secondary | ICD-10-CM | POA: Diagnosis not present

## 2018-09-16 DIAGNOSIS — E669 Obesity, unspecified: Secondary | ICD-10-CM | POA: Diagnosis not present

## 2018-09-16 DIAGNOSIS — R7303 Prediabetes: Secondary | ICD-10-CM | POA: Diagnosis not present

## 2018-09-16 DIAGNOSIS — I10 Essential (primary) hypertension: Secondary | ICD-10-CM | POA: Diagnosis not present

## 2018-09-16 DIAGNOSIS — E039 Hypothyroidism, unspecified: Secondary | ICD-10-CM | POA: Diagnosis not present

## 2018-09-17 ENCOUNTER — Encounter: Payer: Self-pay | Admitting: Registered"

## 2018-09-17 ENCOUNTER — Encounter: Payer: Medicare Other | Attending: Internal Medicine | Admitting: Registered"

## 2018-09-17 DIAGNOSIS — Z780 Asymptomatic menopausal state: Secondary | ICD-10-CM | POA: Diagnosis not present

## 2018-09-17 DIAGNOSIS — R7303 Prediabetes: Secondary | ICD-10-CM | POA: Insufficient documentation

## 2018-09-17 DIAGNOSIS — Z90710 Acquired absence of both cervix and uterus: Secondary | ICD-10-CM | POA: Diagnosis not present

## 2018-09-17 DIAGNOSIS — Z1231 Encounter for screening mammogram for malignant neoplasm of breast: Secondary | ICD-10-CM | POA: Diagnosis not present

## 2018-09-17 DIAGNOSIS — E2839 Other primary ovarian failure: Secondary | ICD-10-CM | POA: Diagnosis not present

## 2018-09-17 DIAGNOSIS — J45909 Unspecified asthma, uncomplicated: Secondary | ICD-10-CM | POA: Diagnosis not present

## 2018-09-17 NOTE — Progress Notes (Signed)
Patient was seen on 09/17/18 for the Core Session 3 of Diabetes Prevention Program course at [facility]. By the end of this session patients are able to complete the following objectives:   Learning Objectives:  Weigh and measure foods.  Estimate the fat and calorie content of common foods.  Describe three ways to eat less fat and fewer calories.  Create a plan to eat less fat for the following week.   Goals:   Track weight when weighing outside of class.   Track food and beverages eaten each day in Food and Activity Tracker and include fat grams and calories for each.   Try to stay within fat gram goal.   Complete plan for eating less high fat foods and answer related homework questions.    Follow-Up Plan:  Attend Core Session 4 next week.   Bring completed "Food and Activity Tracker" next week to be reviewed by Lifestyle Coach.

## 2018-09-20 ENCOUNTER — Ambulatory Visit: Payer: Medicare Other

## 2018-09-20 ENCOUNTER — Other Ambulatory Visit: Payer: Medicare Other

## 2018-09-20 DIAGNOSIS — J301 Allergic rhinitis due to pollen: Secondary | ICD-10-CM | POA: Diagnosis not present

## 2018-09-20 DIAGNOSIS — R55 Syncope and collapse: Secondary | ICD-10-CM

## 2018-09-20 DIAGNOSIS — J3089 Other allergic rhinitis: Secondary | ICD-10-CM | POA: Diagnosis not present

## 2018-09-21 NOTE — Progress Notes (Deleted)
Patient is here for follow up visit.  Subjective:   Becky Davis, female    DOB: 1950-10-07, 68 y.o.   MRN: [id number]   No chief complaint on file.   *** HPI  68 y.o. *** female with ***  *** Past Medical History:  Diagnosis Date  . Arthritis    knees  . Asthma   . Headache    hx of 20 years ago  . Heart murmur    hx of 10-15 years ago  . Hypertension   . Hypothyroidism   . Pneumonia   . Pre-diabetes   . Thyroid nodule     *** Past Surgical History:  Procedure Laterality Date  . ABDOMINAL HYSTERECTOMY    . thyroid goiter     surgery  . THYROID LOBECTOMY     06-25-17 Dr. Harlow Asa   Left  . THYROID LOBECTOMY Left 06/25/2017   Procedure: LEFT THYROID LOBECTOMY;  Surgeon: Armandina Gemma, MD;  Location: [facility];  Service: General;  Laterality: Left;    *** Social History   Socioeconomic History  . Marital status: Married    Spouse name: Not on file  . Number of children: 3  . Years of education: Not on file  . Highest education level: Not on file  Occupational History  . Not on file  Social Needs  . Financial resource strain: Not on file  . Food insecurity:    Worry: Not on file    Inability: Not on file  . Transportation needs:    Medical: Not on file    Non-medical: Not on file  Tobacco Use  . Smoking status: Never Smoker  . Smokeless tobacco: Never Used  Substance and Sexual Activity  . Alcohol use: No    Frequency: Never  . Drug use: No  . Sexual activity: Not Currently  Lifestyle  . Physical activity:    Days per week: Not on file    Minutes per session: Not on file  . Stress: Not on file  Relationships  . Social connections:    Talks on phone: Not on file    Gets together: Not on file    Attends religious service: Not on file    Active member of club or organization: Not on file    Attends meetings of clubs or organizations: Not on file    Relationship status: Not on file  . Intimate partner violence:    Fear of current or ex  partner: Not on file    Emotionally abused: Not on file    Physically abused: Not on file    Forced sexual activity: Not on file  Other Topics Concern  . Not on file  Social History Narrative  . Not on file    *** Current Outpatient Medications on File Prior to Visit  Medication Sig Dispense Refill  . albuterol (PROVENTIL) (2.5 MG/3ML) 0.083% nebulizer solution Take 2.5 mg by nebulization every 4 (four) hours as needed for wheezing or shortness of breath.    . Albuterol Sulfate (PROAIR HFA IN) Inhale into the lungs.    . ARIPiprazole (ABILIFY) 10 MG tablet Take 10 mg by mouth daily.    . budesonide-formoterol (SYMBICORT) 160-4.5 MCG/ACT inhaler Inhale 2 puffs into the lungs 2 (two) times daily. 1 Inhaler 6  . levothyroxine (SYNTHROID, LEVOTHROID) 100 MCG tablet Take 100 mcg by mouth daily before breakfast.    . meloxicam (MOBIC) 15 MG tablet Take 15 mg by mouth daily as needed for pain.    Marland Kitchen  montelukast (SINGULAIR) 10 MG tablet Take 10 mg by mouth at bedtime.    Marland Kitchen PARoxetine (PAXIL) 20 MG tablet Take 20 mg by mouth daily.    Marland Kitchen triamterene-hydrochlorothiazide (DYAZIDE) 37.5-25 MG capsule Take 1 capsule by mouth every morning.     No current facility-administered medications on file prior to visit.     Cardiovascular studies:  ***  Echocardiogram 09/20/2018: Left ventricle cavity is normal in size. Normal left ventricular shape. Normal global wall motion. Doppler evidence of grade I (impaired) diastolic dysfunction, normal LAP. Calculated EF 69%. Left atrial cavity is moderately dilated. Mild (Grade I) aortic regurgitation. Mild (Grade I) mitral regurgitation. Mild tricuspid regurgitation. Estimated pulmonary artery systolic pressure 35 mmHg.   *** Recent labs:  ***  ROS     Objective:   *** There were no vitals filed for this visit.   Physical Exam      Assessment & Recommendations:   ***  There are no diagnoses linked to this encounter.   Nigel Mormon, MD [facility] Pager: [phone] Office: [phone] If no answer Cell [phone]

## 2018-09-24 ENCOUNTER — Other Ambulatory Visit: Payer: Self-pay

## 2018-09-24 ENCOUNTER — Encounter: Payer: Self-pay | Admitting: Registered"

## 2018-09-24 ENCOUNTER — Encounter (HOSPITAL_BASED_OUTPATIENT_CLINIC_OR_DEPARTMENT_OTHER): Payer: Medicare Other | Admitting: Registered"

## 2018-09-24 DIAGNOSIS — R7303 Prediabetes: Secondary | ICD-10-CM

## 2018-09-24 NOTE — Progress Notes (Signed)
Patient was seen on 09/24/18 for the Core Session 4 of Diabetes Prevention Program course at [facility]. By the end of this session patients are able to complete the following objectives:   Learning Objectives:  Explain the health benefits of eating less fat and fewer calories.  Describe the MyPlate food guide and its recommendations, including how to reduce fat and calories in our diet.  Compare and contrast MyPlate guidelines with participants' eating habits.  List ways to replace high-fat and high-calorie foods with low-fat and low-calorie foods.  Explain the importance of eating plenty of whole grains, vegetables, and fruits, while staying within fat gram goals.  Explain the importance of eating foods from all groups of MyPlate and of eating a variety of foods from within each group.  Explain why a balanced diet is beneficial to health.  Explain why eating the same foods over and over is not the best strategy for long-term success.   Goals:   Record weight taken outside of class.   Track foods and beverages eaten each day in the "Food and Activity Tracker," including calories and fat grams for each item.   Practice comparing what you eat with the recommendations of MyPlate using the "Rate Your Plate" handout.   Complete the "Rate Your Plate" handout form on at least 3 days.   Answer homework questions.   Follow-Up Plan:  Attend Core Session 5 next week.   Bring completed "Food and Activity Tracker" next week to be reviewed by Lifestyle Coach.

## 2018-09-27 ENCOUNTER — Ambulatory Visit: Payer: Medicare Other | Admitting: Cardiology

## 2018-09-28 ENCOUNTER — Other Ambulatory Visit: Payer: Self-pay

## 2018-09-28 ENCOUNTER — Emergency Department (HOSPITAL_COMMUNITY)
Admission: EM | Admit: 2018-09-28 | Discharge: 2018-09-28 | Disposition: A | Discharge status: Home / Self Care | Payer: Medicare Other | Attending: Emergency Medicine | Admitting: Emergency Medicine

## 2018-09-28 ENCOUNTER — Emergency Department (HOSPITAL_COMMUNITY): Payer: Medicare Other

## 2018-09-28 ENCOUNTER — Encounter (HOSPITAL_COMMUNITY): Payer: Self-pay | Admitting: Emergency Medicine

## 2018-09-28 DIAGNOSIS — E039 Hypothyroidism, unspecified: Secondary | ICD-10-CM | POA: Diagnosis not present

## 2018-09-28 DIAGNOSIS — Z79899 Other long term (current) drug therapy: Secondary | ICD-10-CM | POA: Diagnosis not present

## 2018-09-28 DIAGNOSIS — R5381 Other malaise: Secondary | ICD-10-CM | POA: Diagnosis not present

## 2018-09-28 DIAGNOSIS — J45909 Unspecified asthma, uncomplicated: Secondary | ICD-10-CM | POA: Insufficient documentation

## 2018-09-28 DIAGNOSIS — R55 Syncope and collapse: Secondary | ICD-10-CM

## 2018-09-28 DIAGNOSIS — I10 Essential (primary) hypertension: Secondary | ICD-10-CM | POA: Diagnosis not present

## 2018-09-28 DIAGNOSIS — R05 Cough: Secondary | ICD-10-CM | POA: Diagnosis not present

## 2018-09-28 DIAGNOSIS — E876 Hypokalemia: Secondary | ICD-10-CM

## 2018-09-28 DIAGNOSIS — R509 Fever, unspecified: Secondary | ICD-10-CM | POA: Diagnosis not present

## 2018-09-28 LAB — CBC WITH DIFFERENTIAL/PLATELET
Abs Immature Granulocytes: 0.04 10*3/uL (ref 0.00–0.07)
Basophils Absolute: 0 10*3/uL (ref 0.0–0.1)
Basophils Relative: 0 %
Eosinophils Absolute: 0.1 10*3/uL (ref 0.0–0.5)
Eosinophils Relative: 2 %
HCT: 33.9 % — ABNORMAL LOW (ref 36.0–46.0)
Hemoglobin: 11.5 g/dL — ABNORMAL LOW (ref 12.0–15.0)
Immature Granulocytes: 0 %
Lymphocytes Relative: 20 %
Lymphs Abs: 1.8 10*3/uL (ref 0.7–4.0)
MCH: 30.3 pg (ref 26.0–34.0)
MCHC: 33.9 g/dL (ref 30.0–36.0)
MCV: 89.2 fL (ref 80.0–100.0)
Monocytes Absolute: 0.8 10*3/uL (ref 0.1–1.0)
Monocytes Relative: 9 %
Neutro Abs: 6.3 10*3/uL (ref 1.7–7.7)
Neutrophils Relative %: 69 %
Platelets: 215 10*3/uL (ref 150–400)
RBC: 3.8 MIL/uL — ABNORMAL LOW (ref 3.87–5.11)
RDW: 12.2 % (ref 11.5–15.5)
WBC: 9.1 10*3/uL (ref 4.0–10.5)
nRBC: 0 % (ref 0.0–0.2)

## 2018-09-28 LAB — BASIC METABOLIC PANEL
Anion gap: 45 — ABNORMAL HIGH (ref 5–15)
BUN: 20 mg/dL (ref 8–23)
CO2: 19 mmol/L — ABNORMAL LOW (ref 22–32)
Calcium: 5.5 mg/dL — CL (ref 8.9–10.3)
Chloride: 81 mmol/L — ABNORMAL LOW (ref 98–111)
Creatinine, Ser: 0.68 mg/dL (ref 0.44–1.00)
GFR calc Af Amer: >60 mL/min (ref >60)
GFR calc non Af Amer: >60 mL/min (ref >60)
Glucose, Bld: 106 mg/dL — ABNORMAL HIGH (ref 70–99)
Potassium: 2.9 mmol/L — ABNORMAL LOW (ref 3.5–5.1)
Sodium: 145 mmol/L (ref 135–145)

## 2018-09-28 LAB — TSH: TSH: 2.551 u[IU]/mL (ref 0.350–4.500)

## 2018-09-28 LAB — LACTIC ACID, PLASMA: Lactic Acid, Venous: 1.7 mmol/L (ref 0.5–1.9)

## 2018-09-28 LAB — MAGNESIUM: Magnesium: 2.2 mg/dL (ref 1.7–2.4)

## 2018-09-28 MED ORDER — CALCIUM GLUCONATE-NACL 1-0.675 GM/50ML-% IV SOLN
1.0000 g | Freq: Once | INTRAVENOUS | Status: AC
  Administered 2018-09-28: 1000 mg via INTRAVENOUS
  Filled 2018-09-28: qty 50

## 2018-09-28 MED ORDER — SODIUM CHLORIDE 0.9 % IV SOLN
INTRAVENOUS | Status: DC
  Administered 2018-09-28: 12:00:00 via INTRAVENOUS

## 2018-09-28 MED ORDER — POTASSIUM CHLORIDE CRYS ER 20 MEQ PO TBCR
40.0000 meq | EXTENDED_RELEASE_TABLET | Freq: Once | ORAL | Status: AC
  Administered 2018-09-28: 40 meq via ORAL
  Filled 2018-09-28: qty 2

## 2018-09-28 MED ORDER — POTASSIUM CHLORIDE ER 10 MEQ PO TBCR: 10.0000 meq | EXTENDED_RELEASE_TABLET | Freq: Two times a day (BID) | ORAL | 0 refills | Status: DC

## 2018-09-28 MED ORDER — CALCIUM CARBONATE-VITAMIN D 500-200 MG-UNIT PO TABS: 1.0000 | ORAL_TABLET | Freq: Two times a day (BID) | ORAL | 0 refills | Status: DC

## 2018-09-28 MED ORDER — SODIUM CHLORIDE 0.9 % IV BOLUS
500.0000 mL | Freq: Once | INTRAVENOUS | Status: AC
  Administered 2018-09-28: 500 mL via INTRAVENOUS

## 2018-09-28 MED ORDER — SODIUM CHLORIDE 0.9 % IV SOLN: 1.0000 g | Freq: Once | INTRAVENOUS | Status: DC

## 2018-09-28 NOTE — ED Provider Notes (Signed)
[facility] EMERGENCY DEPARTMENT Provider Note   CSN: [id number] Arrival date & time: 09/28/18  8099    History   Chief Complaint Chief Complaint  Patient presents with  . Near Syncope  . Flu-like symptoms    HPI PENNI PENADO is a 68 y.o. female.     HPI   68 year old female presents today with complaints of hypertension.  Patient notes a significant past medical history of orthostatic hypotension.  She was seen in [facility] approximately 1 month ago for the same.  She is currently under the care of a cardiologist and has an appointment tomorrow for follow-up evaluation.  Notes that over the last week she had upper respiratory symptoms.  These have improved and over the weekend was feeling well.  She notes no productive cough presently.  Upon awakening this morning she felt weak, felt like she had sweats.  She denies any associated chest pain shortness of breath or productive cough.  Patient called EMS.  Upon EMS arrival she had normal vital signs.  She denies any close sick contacts, denies any recent travel noting that her last travel was in December.  Did receive the influenza vaccine this year.  The patient notes that since arrival to the emergency room she is feeling much improved and is close to her baseline.  Patient notes a history of thyroid lobectomy performed in 2018.  She notes she is currently on Synthroid and is taking it daily.  Past Medical History:  Diagnosis Date  . Arthritis    knees  . Asthma   . Headache    hx of 20 years ago  . Heart murmur    hx of 10-15 years ago  . Hypertension   . Hypothyroidism   . Pneumonia   . Pre-diabetes   . Thyroid nodule     Patient Active Problem List   Diagnosis Date Noted  . Syncope 09/13/2018  . Left thyroid nodule 06/25/2017    Past Surgical History:  Procedure Laterality Date  . ABDOMINAL HYSTERECTOMY    . thyroid goiter     surgery  . THYROID LOBECTOMY     06-25-17 Dr. Harlow Asa    Left  . THYROID LOBECTOMY Left 06/25/2017   Procedure: LEFT THYROID LOBECTOMY;  Surgeon: Armandina Gemma, MD;  Location: [facility];  Service: General;  Laterality: Left;     OB History   No obstetric history on file.      Home Medications    Prior to Admission medications   Medication Sig Start Date End Date Taking? Authorizing Provider  albuterol (PROVENTIL) (2.5 MG/3ML) 0.083% nebulizer solution Take 2.5 mg by nebulization every 4 (four) hours as needed for wheezing or shortness of breath.    [provider]  Albuterol Sulfate (PROAIR HFA IN) Inhale into the lungs.    [provider]  ARIPiprazole (ABILIFY) 10 MG tablet Take 10 mg by mouth daily.    [provider]  budesonide-formoterol (SYMBICORT) 160-4.5 MCG/ACT inhaler Inhale 2 puffs into the lungs 2 (two) times daily. 08/06/18   Mannam, Hart Robinsons, MD  levothyroxine (SYNTHROID, LEVOTHROID) 100 MCG tablet Take 100 mcg by mouth daily before breakfast.    [provider]  meloxicam (MOBIC) 15 MG tablet Take 15 mg by mouth daily as needed for pain.    [provider]  montelukast (SINGULAIR) 10 MG tablet Take 10 mg by mouth at bedtime.    [provider]  PARoxetine (PAXIL) 20 MG tablet Take 20 mg  by mouth daily.    [provider]  triamterene-hydrochlorothiazide (DYAZIDE) 37.5-25 MG capsule Take 1 capsule by mouth every morning. 07/26/18   [provider]    Family History Family History  Problem Relation Age of Onset  . Emphysema Mother   . Cancer Mother   . Heart disease Mother   . Cancer Sister   . Cancer Brother     Social History Social History   Tobacco Use  . Smoking status: Never Smoker  . Smokeless tobacco: Never Used  Substance Use Topics  . Alcohol use: No    Frequency: Never  . Drug use: No     Allergies   Prednisone; Juniper oil; and Penicillins   Review of Systems Review of Systems  All other systems reviewed and are negative.    Physical Exam Updated Vital Signs BP 126/80   Pulse (!) 58   Temp 97.7 F (36.5 C) (Oral)   Resp 20   Ht 5\' 2"  (1.575 m)   Wt 84.3 kg   LMP  (Exact Date)   SpO2 100%   BMI 34.00 kg/m   Physical Exam Vitals signs and nursing note reviewed.  Constitutional:      Appearance: She is well-developed.  HENT:     Head: Normocephalic and atraumatic.  Eyes:     General: No scleral icterus.       Right eye: No discharge.        Left eye: No discharge.     Conjunctiva/sclera: Conjunctivae normal.     Pupils: Pupils are equal, round, and reactive to light.  Neck:     Musculoskeletal: Normal range of motion.     Vascular: No JVD.     Trachea: No tracheal deviation.  Cardiovascular:     Rate and Rhythm: Normal rate and regular rhythm.  Pulmonary:     Effort: Pulmonary effort is normal. No respiratory distress.     Breath sounds: No stridor. No wheezing, rhonchi or rales.  Skin:    General: Skin is warm.  Neurological:     Mental Status: She is alert and oriented to person, place, and time.     Coordination: Coordination normal.  Psychiatric:        Behavior: Behavior normal.        Thought Content: Thought content normal.        Judgment: Judgment normal.      ED Treatments / Results  Labs (all labs ordered are listed, but only abnormal results are displayed) Labs Reviewed  CBC WITH DIFFERENTIAL/PLATELET - Abnormal; Notable for the following components:      Result Value   RBC 3.80 (*)    Hemoglobin 11.5 (*)    HCT 33.9 (*)    All other components within normal limits  BASIC METABOLIC PANEL - Abnormal; Notable for the following components:   Potassium 2.9 (*)    Chloride 81 (*)    CO2 19 (*)    Glucose, Bld 106 (*)    Calcium 5.5 (*)    Anion gap 45 (*)    All other components within normal limits  LACTIC ACID, PLASMA  LACTIC ACID, PLASMA  MAGNESIUM  TSH  T4  PARATHYROID HORMONE, INTACT (NO CA)    EKG None  Radiology Dg Chest 2 View  Result Date:  09/28/2018 CLINICAL DATA:  Cough 10 days with weakness. EXAM: CHEST - 2 VIEW COMPARISON:  07/27/2017 and chest CT 07/27/2017 FINDINGS: Lungs are adequately inflated and otherwise clear. Cardiomediastinal silhouette and  remainder the exam is unchanged. IMPRESSION: No active cardiopulmonary disease. Electronically Signed   By: Marin Olp M.D.   On: 09/28/2018 09:01    Procedures Procedures (including critical care time)   CRITICAL CARE Performed by: Stevie Kern Starlynn Klinkner   Total critical care time: 35 minutes  Critical care time was exclusive of separately billable procedures and treating other patients.  Critical care was necessary to treat or prevent imminent or life-threatening deterioration.  Critical care was time spent personally by me on the following activities: development of treatment plan with patient and/or surrogate as well as nursing, discussions with consultants, evaluation of patient's response to treatment, examination of patient, obtaining history from patient or surrogate, ordering and performing treatments and interventions, ordering and review of laboratory studies, ordering and review of radiographic studies, pulse oximetry and re-evaluation of patient's condition.   Medications Ordered in ED Medications  calcium gluconate 1 g/ 50 mL sodium chloride IVPB (has no administration in time range)     Initial Impression / Assessment and Plan / ED Course  I have reviewed the triage vital signs and the nursing notes.  Pertinent labs & imaging results that were available during my care of the patient were reviewed by me and considered in my medical decision making (see chart for details).         Assessment/Plan: 68 year old female presents today with near syncopal episode.  She has no respiratory symptoms, no fever here with stable vital signs.  Initial labs returned showing severe hypokalemia, elevated anion gap, and a potassium of 2.9.  Patient started on calcium  gluconate, given the location of the patient without central monitoring she will need to be transferred to an upgraded room, care will be transferred to attending physician for ongoing evaluation and management.    Final Clinical Impressions(s) / ED Diagnoses   Final diagnoses:  Hypocalcemia    ED Discharge Orders    None       Okey Regal, PA-C 09/28/18 1004    Fredia Sorrow, MD 09/28/18 1330

## 2018-09-28 NOTE — ED Triage Notes (Addendum)
Pt BIB [facility] for flu-like symptoms that she woke up with today after recent travel outside the country to the [place] in December. Pt reports a feeling of almost passing out but never did this a.m. when she woke up.

## 2018-09-28 NOTE — Discharge Instructions (Addendum)
Make an appointment to follow-up with your primary care doctor to have your calcium and potassium rechecked in about a week.  Take the calcium supplements and the potassium supplements as directed.  Recommend eating foods high in calcium.  Return for any new or worse symptoms.

## 2018-09-28 NOTE — ED Notes (Signed)
Pt given beef broth, ok by EDP

## 2018-09-29 ENCOUNTER — Ambulatory Visit: Payer: Medicare Other | Admitting: Cardiology

## 2018-09-29 LAB — PARATHYROID HORMONE, INTACT (NO CA): PTH: 19 pg/mL (ref 15–65)

## 2018-09-29 LAB — T4: T4, Total: 7.4 ug/dL (ref 4.5–12.0)

## 2018-10-01 DIAGNOSIS — E876 Hypokalemia: Secondary | ICD-10-CM | POA: Diagnosis not present

## 2018-10-01 DIAGNOSIS — E041 Nontoxic single thyroid nodule: Secondary | ICD-10-CM | POA: Diagnosis not present

## 2018-10-01 DIAGNOSIS — E039 Hypothyroidism, unspecified: Secondary | ICD-10-CM | POA: Diagnosis not present

## 2018-10-01 DIAGNOSIS — E209 Hypoparathyroidism, unspecified: Secondary | ICD-10-CM | POA: Diagnosis not present

## 2018-10-03 NOTE — Progress Notes (Signed)
Patient referred by [name], MD for evaluation of presyncope  Subjective:   Becky Davis, female    DOB: Nov 11, 1950, 68 y.o.   MRN: [id number]   Chief Complaint  Patient presents with  . Loss of Consciousness  . Follow-up    HPI  68 y/o Caucasian female with hypertension. OSA, h/o thyroidectomy, moderate persistent asthma, with syncope, here for follow up.  Patient's echocardiogram showed structurally normal heart with only mildly elevated pulmonary artery systolic pressure.  No structural etiology found for her syncope.  Patient was in the ED last week after she had another episode.  She hit her head without any major injury.  While in the ED, she was found to have hypokalemia 2.9.  No other significant abnormalities found.  Patient was discharged home.  She is here for follow-up today.  She has not had any other recurrent syncopal episode.  She has cough today, which is her baseline finding in the setting of her asthma.  She denies any fevers, chills.  Temperature is 99 F.  She denies any sick contacts.  Patient  Past Medical History:  Diagnosis Date  . Arthritis    knees  . Asthma   . Headache    hx of 20 years ago  . Heart murmur    hx of 10-15 years ago  . Hypertension   . Hypothyroidism   . Pneumonia   . Pre-diabetes   . Thyroid nodule      Past Surgical History:  Procedure Laterality Date  . ABDOMINAL HYSTERECTOMY    . thyroid goiter     surgery  . THYROID LOBECTOMY     06-25-17 Dr. Harlow Asa   Left  . THYROID LOBECTOMY Left 06/25/2017   Procedure: LEFT THYROID LOBECTOMY;  Surgeon: Armandina Gemma, MD;  Location: [facility];  Service: General;  Laterality: Left;     Social History   Socioeconomic History  . Marital status: Married    Spouse name: Not on file  . Number of children: 3  . Years of education: Not on file  . Highest education level: Not on file  Occupational History  . Not on file  Social Needs  . Financial resource strain: Not on  file  . Food insecurity:    Worry: Not on file    Inability: Not on file  . Transportation needs:    Medical: Not on file    Non-medical: Not on file  Tobacco Use  . Smoking status: Never Smoker  . Smokeless tobacco: Never Used  Substance and Sexual Activity  . Alcohol use: No    Frequency: Never  . Drug use: No  . Sexual activity: Not Currently  Lifestyle  . Physical activity:    Days per week: Not on file    Minutes per session: Not on file  . Stress: Not on file  Relationships  . Social connections:    Talks on phone: Not on file    Gets together: Not on file    Attends religious service: Not on file    Active member of club or organization: Not on file    Attends meetings of clubs or organizations: Not on file    Relationship status: Not on file  . Intimate partner violence:    Fear of current or ex partner: Not on file    Emotionally abused: Not on file    Physically abused: Not on file    Forced sexual activity: Not on file  Other  Topics Concern  . Not on file  Social History Narrative  . Not on file     Current Outpatient Medications on File Prior to Visit  Medication Sig Dispense Refill  . albuterol (PROAIR HFA) 108 (90 Base) MCG/ACT inhaler Inhale 2 puffs into the lungs as needed (Shortness of breath).     Marland Kitchen albuterol (PROVENTIL) (2.5 MG/3ML) 0.083% nebulizer solution Take 2.5 mg by nebulization every 4 (four) hours as needed for wheezing or shortness of breath.    . ARIPiprazole (ABILIFY) 10 MG tablet Take 10 mg by mouth at bedtime.     . budesonide-formoterol (SYMBICORT) 160-4.5 MCG/ACT inhaler Inhale 2 puffs into the lungs 2 (two) times daily. 1 Inhaler 6  . calcium-vitamin D (OSCAL 500/200 D-3) 500-200 MG-UNIT tablet Take 1 tablet by mouth 2 (two) times daily for 7 days. 14 tablet 0  . levothyroxine (SYNTHROID, LEVOTHROID) 100 MCG tablet Take 100 mcg by mouth daily before breakfast.    . meloxicam (MOBIC) 15 MG tablet Take 15 mg by mouth daily as needed  for pain.    . montelukast (SINGULAIR) 10 MG tablet Take 10 mg by mouth at bedtime.    Marland Kitchen PARoxetine (PAXIL) 20 MG tablet Take 20 mg by mouth at bedtime.     . potassium chloride (K-DUR) 10 MEQ tablet Take 1 tablet (10 mEq total) by mouth 2 (two) times daily. 6 tablet 0  . triamterene-hydrochlorothiazide (DYAZIDE) 37.5-25 MG capsule Take 1 capsule by mouth every morning.     No current facility-administered medications on file prior to visit.     Cardiovascular studies:  Echocardiogram 09/20/2018: Left ventricle cavity is normal in size. Normal left ventricular shape. Normal global wall motion. Doppler evidence of grade I (impaired) diastolic dysfunction, normal LAP. Calculated EF 69%. Left atrial cavity is moderately dilated. Mild (Grade I) aortic regurgitation. Mild (Grade I) mitral regurgitation. Mild tricuspid regurgitation. Estimated pulmonary artery systolic pressure 35 mmHg.  EKG 09/28/2018: Sinus bradycardia Cannot rule out Anterior infarct , age undetermined Abnormal ECG.  CT Angio Chest 07/27/2017: 1. No evidence of pulmonary embolism. 2. Moderate bronchial wall thickening indicating asthma and/or bronchitis. No acute cardiopulmonary disease otherwise.   Recent labs:  CMP Latest Ref Rng & Units 09/28/2018  Glucose 70 - 99 mg/dL 106(H)  BUN 8 - 23 mg/dL 20  Creatinine 0.44 - 1.00 mg/dL 0.68  Sodium 135 - 145 mmol/L 145  Potassium 3.5 - 5.1 mmol/L 2.9(L)  Chloride 98 - 111 mmol/L 81(L)  CO2 22 - 32 mmol/L 19(L)  Calcium 8.9 - 10.3 mg/dL 5.5(LL)   CBC Latest Ref Rng & Units 09/28/2018  WBC 4.0 - 10.5 K/uL 9.1  Hemoglobin 12.0 - 15.0 g/dL 11.5(L)  Hematocrit 36.0 - 46.0 % 33.9(L)  Platelets 150 - 400 K/uL 215    Lab Results  Component Value Date   TSH 2.551 09/28/2018   Estimated Creatinine Clearance: 66.8 mL/min   Review of Systems  Constitution: Negative for decreased appetite, malaise/fatigue, weight gain and weight loss.  HENT: Negative for congestion.    Eyes: Negative for visual disturbance.  Cardiovascular: Positive for near-syncope. Negative for chest pain, claudication, dyspnea on exertion, leg swelling, palpitations and syncope.  Respiratory: Negative for shortness of breath.   Endocrine: Negative for cold intolerance.  Hematologic/Lymphatic: Does not bruise/bleed easily.  Skin: Negative for itching and rash.  Musculoskeletal: Positive for arthritis. Negative for myalgias.  Gastrointestinal: Negative for abdominal pain, nausea and vomiting.  Genitourinary: Negative for dysuria.  Neurological: Positive for light-headedness. Negative  for dizziness and weakness.  Psychiatric/Behavioral: Positive for depression. The patient is not nervous/anxious.   All other systems reviewed and are negative.        Vitals:   10/04/18 0958  BP: 126/84  Pulse: 74  SpO2: 96%    Objective:   Physical Exam  Constitutional: She is oriented to person, place, and time. She appears well-developed and well-nourished. No distress.  HENT:  Head: Normocephalic and atraumatic.  Eyes: Pupils are equal, round, and reactive to light. Conjunctivae are normal.  Neck: No JVD present.  Cardiovascular: Normal rate, regular rhythm and intact distal pulses.  No murmur heard. Pulmonary/Chest: Effort normal and breath sounds normal. She has no wheezes. She has no rales.  Abdominal: Soft. Bowel sounds are normal. There is no rebound.  Musculoskeletal:        General: No edema.  Lymphadenopathy:    She has no cervical adenopathy.  Neurological: She is alert and oriented to person, place, and time. No cranial nerve deficit.  Skin: Skin is warm and dry.  Psychiatric: She has a normal mood and affect.  Nursing note and vitals reviewed.         Assessment & Recommendations:    68 y/o Caucasian female with hypertension, depression OSA, h/o thyroidectomy, moderate persistent asthma, with syncope.  Syncope: No structural etiology identified for her syncope.   Arrhythmogenic etiology less likely. Most likely neurocardiogenic related to dehydration and orthostatic hypotension. I suspect triamterene HCTZ is contributing to her hypotension as well as hypokalemia.  I have asked her to stop this. Encourage adequate hydration.  She sees endocrinologist Dr. Buddy Duty.  If hypokalemia and orthostatic hypotension recurs in spite of stopping triamterene-hydrochlorothiazide, she may need further work-up for adrenal insufficiency.  Still, this is less likely in view of her hypokalemia.  Patient has chronic cough, with no change recently in her symptoms.  Temperature of 99 is nonspecific.  She has had no sick contacts.  My suspicion for COVID-19 is low.  I do not think she needs further testing at this time.  Nonetheless, In light of COVD-19 outbreak, I encouraged the patient to stay at home as much as possible.  Patient should only leave home for absolute necessities.  Encouraged social distancing and frequent handwashing.  Avoid touching mouth, nose, eyes, ears with her hands.   I will see her on as needed basis.   Nigel Mormon, MD [facility] Pager: [phone] Office: [phone] If no answer Cell [phone]

## 2018-10-04 ENCOUNTER — Encounter: Payer: Self-pay | Admitting: Cardiology

## 2018-10-04 ENCOUNTER — Telehealth: Payer: Self-pay

## 2018-10-04 ENCOUNTER — Other Ambulatory Visit: Payer: Self-pay

## 2018-10-04 ENCOUNTER — Ambulatory Visit (INDEPENDENT_AMBULATORY_CARE_PROVIDER_SITE_OTHER): Payer: Medicare Other | Admitting: Cardiology

## 2018-10-04 VITALS — BP 126/84 | HR 74 | Ht 62.0 in | Wt 187.0 lb

## 2018-10-04 DIAGNOSIS — E876 Hypokalemia: Secondary | ICD-10-CM | POA: Diagnosis not present

## 2018-10-04 DIAGNOSIS — F329 Major depressive disorder, single episode, unspecified: Secondary | ICD-10-CM | POA: Insufficient documentation

## 2018-10-04 DIAGNOSIS — R55 Syncope and collapse: Secondary | ICD-10-CM | POA: Diagnosis not present

## 2018-10-04 DIAGNOSIS — F32A Depression, unspecified: Secondary | ICD-10-CM | POA: Insufficient documentation

## 2018-10-04 NOTE — Telephone Encounter (Signed)
LEft vm for pt to r/s in 4 weeks per Dr.Sethi recommendation due to COVID 19.

## 2018-10-05 ENCOUNTER — Ambulatory Visit: Payer: Medicare Other | Admitting: Neurology

## 2018-10-08 DIAGNOSIS — E209 Hypoparathyroidism, unspecified: Secondary | ICD-10-CM | POA: Diagnosis not present

## 2018-10-08 DIAGNOSIS — E876 Hypokalemia: Secondary | ICD-10-CM | POA: Diagnosis not present

## 2018-10-13 DIAGNOSIS — J301 Allergic rhinitis due to pollen: Secondary | ICD-10-CM | POA: Diagnosis not present

## 2018-10-13 DIAGNOSIS — J3089 Other allergic rhinitis: Secondary | ICD-10-CM | POA: Diagnosis not present

## 2018-11-02 DIAGNOSIS — J301 Allergic rhinitis due to pollen: Secondary | ICD-10-CM | POA: Diagnosis not present

## 2018-11-02 DIAGNOSIS — J3089 Other allergic rhinitis: Secondary | ICD-10-CM | POA: Diagnosis not present

## 2018-11-05 ENCOUNTER — Ambulatory Visit: Payer: Medicare Other | Admitting: Pulmonary Disease

## 2018-11-08 DIAGNOSIS — F4322 Adjustment disorder with anxiety: Secondary | ICD-10-CM | POA: Diagnosis not present

## 2018-11-10 DIAGNOSIS — M1711 Unilateral primary osteoarthritis, right knee: Secondary | ICD-10-CM | POA: Diagnosis not present

## 2018-11-17 NOTE — Telephone Encounter (Signed)
I called pt about doing a office visit with Dr. Leonie Man tomorrow in person. Pt stated she has cough and a cold. I advise it will have to be video. Pt gave verbal consent and also to file insurance. Her chart was updated. Pt has a iphone and knows he will be calling her for facetime.

## 2018-11-18 ENCOUNTER — Telehealth: Payer: Self-pay | Admitting: Neurology

## 2018-11-18 ENCOUNTER — Other Ambulatory Visit: Payer: Self-pay

## 2018-11-18 ENCOUNTER — Ambulatory Visit (INDEPENDENT_AMBULATORY_CARE_PROVIDER_SITE_OTHER): Payer: Medicare Other | Admitting: Neurology

## 2018-11-18 ENCOUNTER — Telehealth: Payer: Self-pay

## 2018-11-18 DIAGNOSIS — Z20828 Contact with and (suspected) exposure to other viral communicable diseases: Secondary | ICD-10-CM | POA: Diagnosis not present

## 2018-11-18 DIAGNOSIS — G450 Vertebro-basilar artery syndrome: Secondary | ICD-10-CM | POA: Diagnosis not present

## 2018-11-18 DIAGNOSIS — R55 Syncope and collapse: Secondary | ICD-10-CM | POA: Diagnosis not present

## 2018-11-18 NOTE — Telephone Encounter (Signed)
I called pt at 11:11am that DR Leonie Man was behind on another video visit. I stated he will be doing video visit shortly. She verbalized understanding.

## 2018-11-18 NOTE — Progress Notes (Signed)
Virtual Visit via Video Note  I connected with Becky Davis on 11/18/18 at 11:00 AM EDT by a video enabled telemedicine application and verified that I am speaking with the correct person using two identifiers.  This visit was performed using face time audio and visual.  Patient's husband was present throughout the visit and complemented her history.  Location: Patient: At her home Provider: At [facility] office Referring physician: Yaakov Guthrie Reason for referral :syncope and incontinence   I discussed the limitations of evaluation and management by telemedicine and the availability of in person appointments. The patient expressed understanding and agreed to proceed.  History of Present Illness: Becky Davis is a pleasant 68 year old Caucasian lady who is seen today for initial video consultation visit.  History is provided by the patient, her husband who was present throughout this visit as well as review of hospital medical records and discharge summary.  She has had recurrent episodes of syncope recently.  She was visiting [name] when on 08/21/2008 she got up in the middle of the night to use the restroom and her husband heard a sound when she fell to the floor.  She was briefly unresponsive and while her husband tried to stand her up she passed out briefly again.  This happened 3 times.  She remains sleepy and disoriented.  He called 911.  She was also incontinent of urine.  There was no witnessed tonic-clonic activity or tongue biting noted.  Patient was admitted at [facility] where a CT scan of the head was obtained and I have reviewed the report which shows no acute abnormality.  Chest x-ray showed no acute cardiopulmonary disease.  The patient's vital signs were stable except blood pressure was low at 108/60.  Serum sodium was 133 and potassium was low at 3.1.  Patient was felt to be dehydrated and given IV fluids.  According to the husband the patient was not her usual self  for day or 2 which was unusual.  The patient to return to [place] and on 09/28/2018 she had another episode of passing out and syncope while walking in her kitchen.  This episode was much briefer and according to the husband she regain consciousness within a few minutes and did not have a prolonged.  Of sleepiness or disorientation like the last time.  She was seen by cardiologist Dr. Joya Gaskins but worse than an got an EKG and a cardiac echo both of which were unremarkable.  Dr. but was not felt the patient symptoms are related to orthostatic hypotension and dehydration.  She had been on hydrochlorothiazide which was discontinued and she has been taking Norvasc instead.  She has had no further episodes of syncope in the last 6 weeks.  The patient was planned to have an MRI and an EEG at [facility] but she got discharged before that could be done.  She denies currently any neurological symptoms in the form of headache, slurred speech, gait or balance problems.  She has no prior history of strokes TIAs seizures or significant head injury with loss of consciousness.  There is no family history of epilepsy either.  Past medical history hypertension, hypothyroidism, prediabetes, thyroid nodule, asthma, knee arthritis Past surgical history abdominal hysterectomy, thyroid lobectomy Social history patient is married lives with her husband.  She does not smoke or drink.  She is quite active and independent in activities of daily living. Home medications Proventil inhaler as needed pro-air inhaler as needed.  Abilify  10 mg daily.  Symbicort inhaler twice daily.  Synthroid 100 mcg daily.  Mobic 15 mg as needed for pain.  Singulair 10 mg at bedtime.  Paxil 20 mg daily.  Norvasc 5 mg daily. 14 system review of systems is positive for syncope, loss of consciousness, sleepiness, disorientation and as otherwise stated in history of present illness and all other systems negative Observations/Objective: Pleasant  middle-age Caucasian lady who appears not to be in distress.  Physical and neurological exam is limited due to constraints from video visit.  She is awake alert oriented to time place and person.  She follows commands well.  She has intact attention, registration and recall.  Extraocular movements appear full range without nystagmus.  Face is symmetric without weakness.  Tongue is midline.  Motor system exam symmetric upper and lower extremity strength without focal weakness.  Fine finger movements are symmetric bilaterally.  She is able to stand on either foot unsupported and on her heels and toes.  She walks with a steady gait.  Assessment 68 year old lady with recurrent episodes of syncope with prolonged recovery and sleepiness following the initial episode raising   Etiology of these episodes is likely orthostasis and dehydration even though the prolonged. of recovery following the initial episode raises concern for possible seizure or TIA.  Plan: I had a long discussion with the patient and her husband regarding her recurrent episodes of syncope in the prolonged recovery.  Which necessitates need to obtain an EEG for any electrical irritability as well as MRI and MRA to look for neurovascular etiology.   Patient was advised to maintain adequate hydration and to avoid dehydration and to do orthostatic tolerance exercises before she gets up from prolonged sitting or lying down position.   She was advised not to drive for the next several months as per [facility].   She will return for follow-up for in person visit in 2 months or call earlier if necessary Follow Up Instructions:    I discussed the assessment and treatment plan with the patient. The patient was provided an opportunity to ask questions and all were answered. The patient agreed with the plan and demonstrated an understanding of the instructions.   The patient was advised to call back or seek an in-person evaluation if the  symptoms worsen or if the condition fails to improve as anticipated.  I provided 45 minutes of non-face-to-face time during this encounter.   Antony Contras, MD

## 2018-11-18 NOTE — Telephone Encounter (Signed)
Medicare/bcbs supp order sent to GI. No auth they will reach out to the pt to schedule.  °

## 2018-11-25 DIAGNOSIS — J3089 Other allergic rhinitis: Secondary | ICD-10-CM | POA: Diagnosis not present

## 2018-11-25 DIAGNOSIS — J301 Allergic rhinitis due to pollen: Secondary | ICD-10-CM | POA: Diagnosis not present

## 2018-11-29 ENCOUNTER — Ambulatory Visit (INDEPENDENT_AMBULATORY_CARE_PROVIDER_SITE_OTHER): Payer: Medicare Other | Admitting: Neurology

## 2018-11-29 ENCOUNTER — Other Ambulatory Visit: Payer: Self-pay

## 2018-11-29 DIAGNOSIS — R55 Syncope and collapse: Secondary | ICD-10-CM | POA: Diagnosis not present

## 2018-12-04 ENCOUNTER — Other Ambulatory Visit: Payer: Medicare Other

## 2018-12-08 DIAGNOSIS — J301 Allergic rhinitis due to pollen: Secondary | ICD-10-CM | POA: Diagnosis not present

## 2018-12-08 DIAGNOSIS — J3089 Other allergic rhinitis: Secondary | ICD-10-CM | POA: Diagnosis not present

## 2018-12-09 DIAGNOSIS — R6889 Other general symptoms and signs: Secondary | ICD-10-CM | POA: Diagnosis not present

## 2018-12-09 DIAGNOSIS — M5412 Radiculopathy, cervical region: Secondary | ICD-10-CM | POA: Diagnosis not present

## 2018-12-10 DIAGNOSIS — M5412 Radiculopathy, cervical region: Secondary | ICD-10-CM | POA: Diagnosis not present

## 2018-12-16 ENCOUNTER — Ambulatory Visit: Payer: Medicare Other | Admitting: Neurology

## 2018-12-17 ENCOUNTER — Ambulatory Visit (HOSPITAL_BASED_OUTPATIENT_CLINIC_OR_DEPARTMENT_OTHER): Payer: Medicare Other | Admitting: Registered"

## 2018-12-17 DIAGNOSIS — R7303 Prediabetes: Secondary | ICD-10-CM

## 2018-12-17 IMAGING — US US THYROID
1 series · 13 of 25 positions shown · non-contrast
Comparison: 12/05/2014

CLINICAL DATA: Prior ultrasound follow-up.

EXAM:
THYROID ULTRASOUND
TECHNIQUE: Ultrasound examination of the thyroid gland and adjacent soft
tissues was performed.

[Series 1: us thyroid · 0.07mm/px · 13 of 36 slices shown]
[im 1/36]
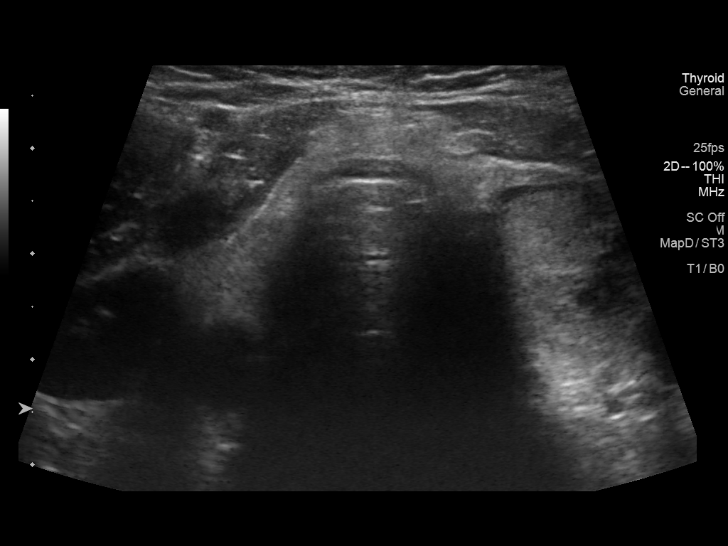
[im 3/36]
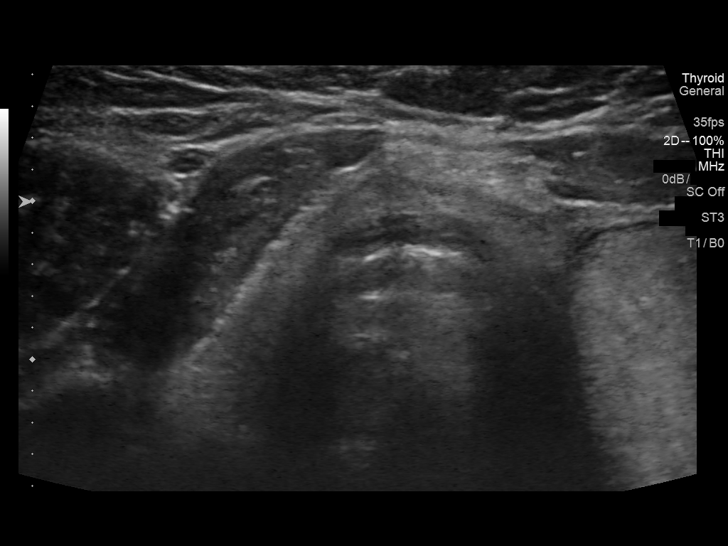
[im 6/36]
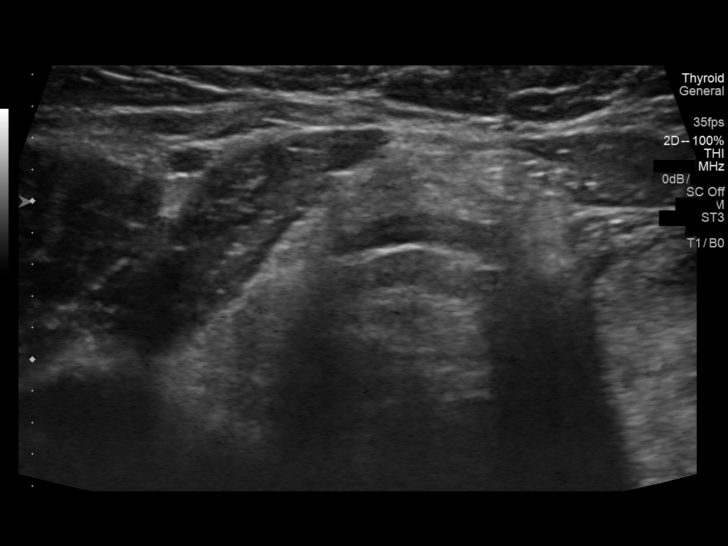
[im 9/36]
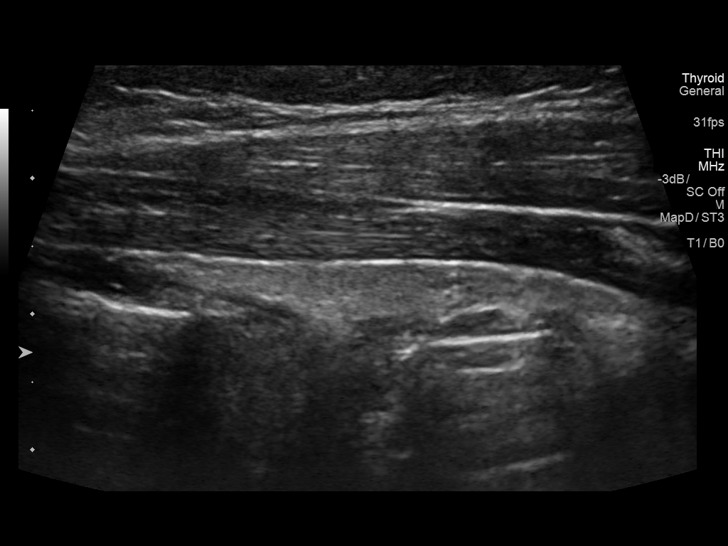
[im 12/36]
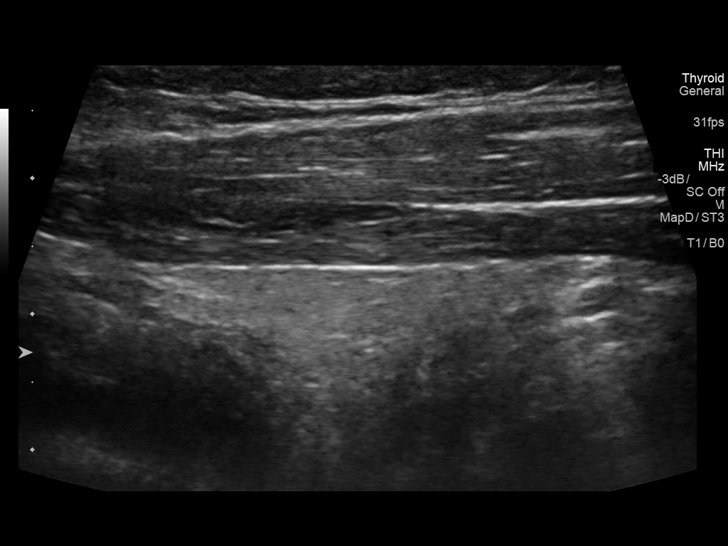
[im 15/36]
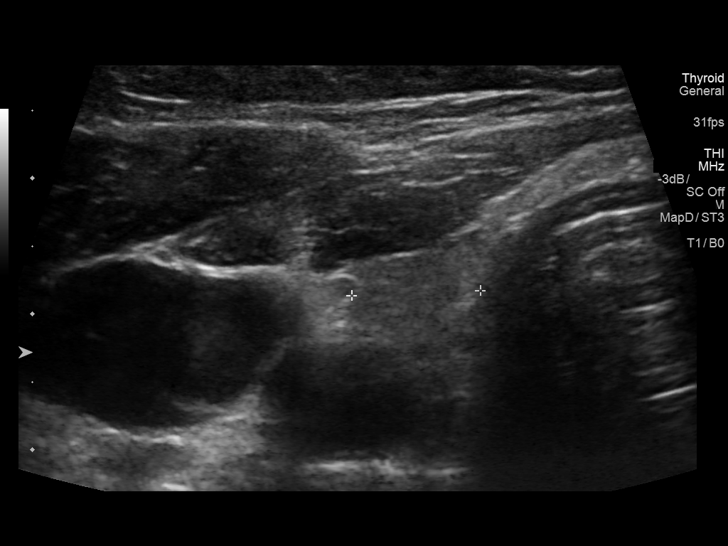
[im 18/36]
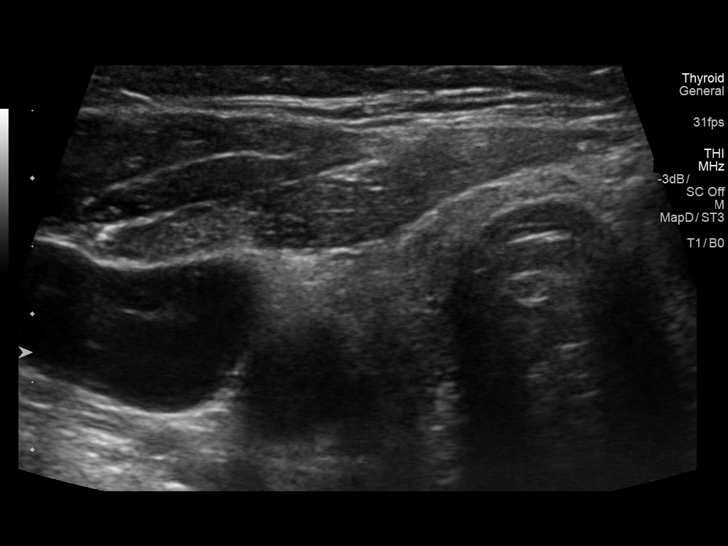
[im 21/36]
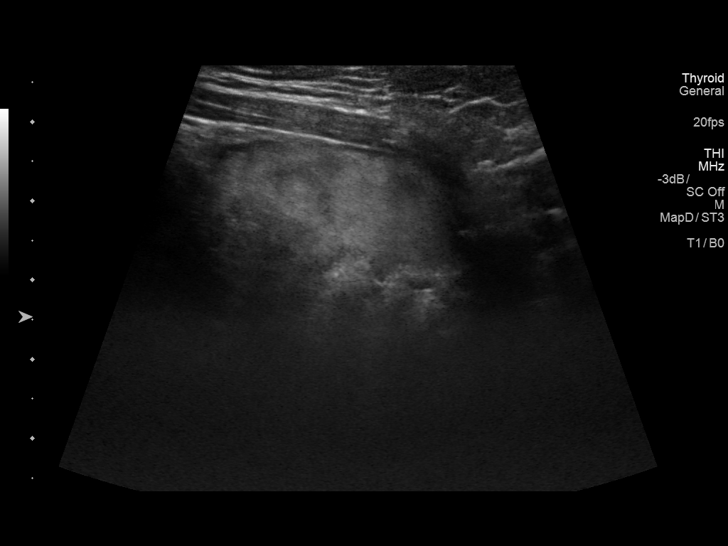
[im 24/36]
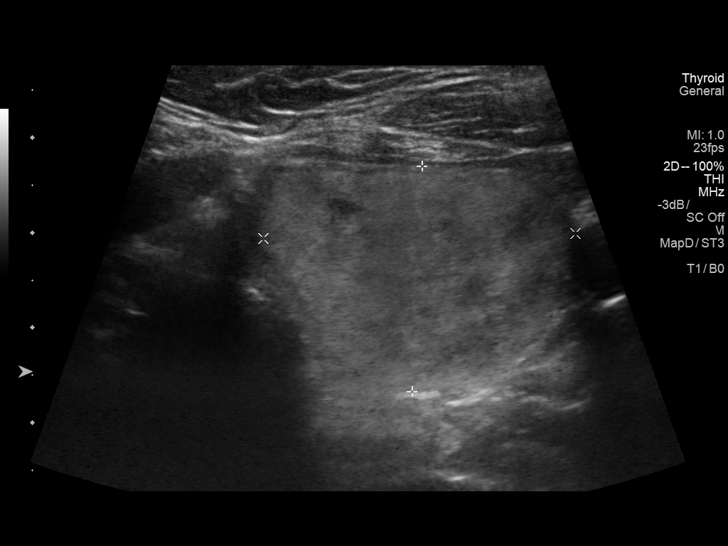
[im 27/36]
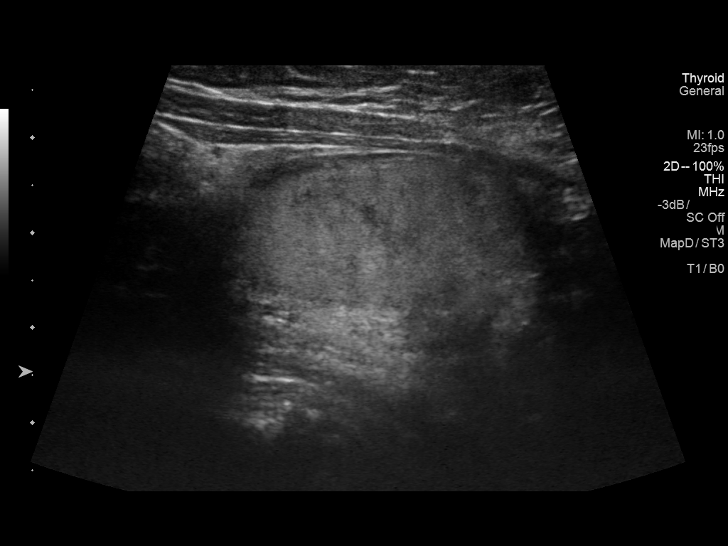
[im 30/36]
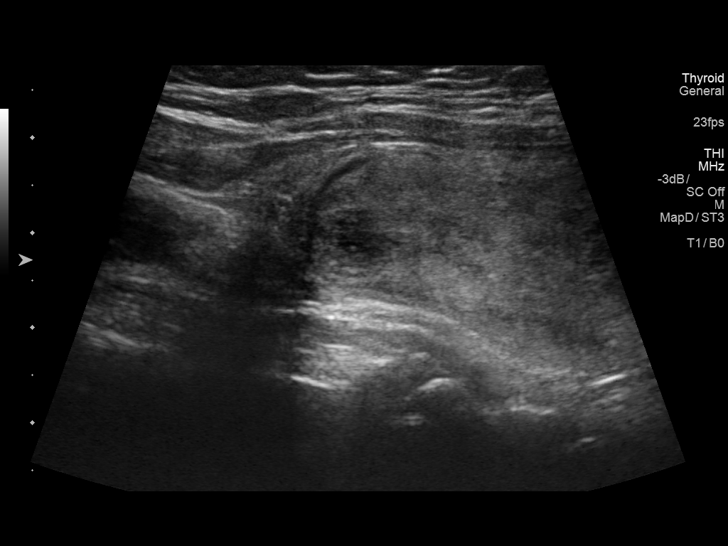
[im 33/36]
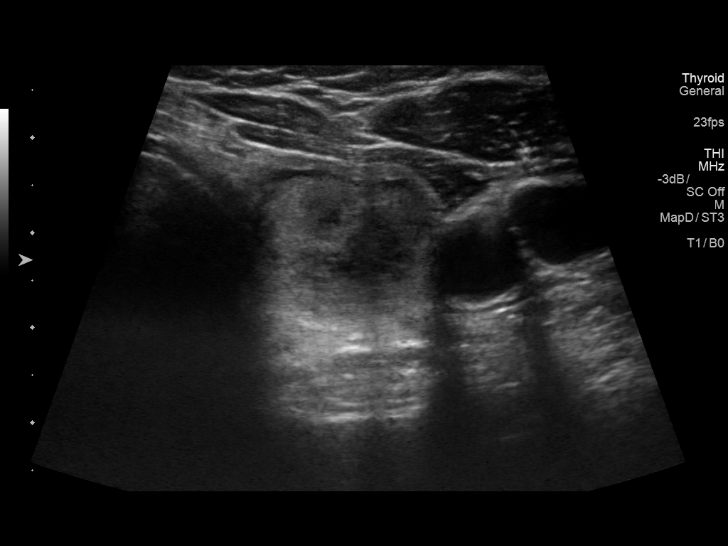
[im 36/36]
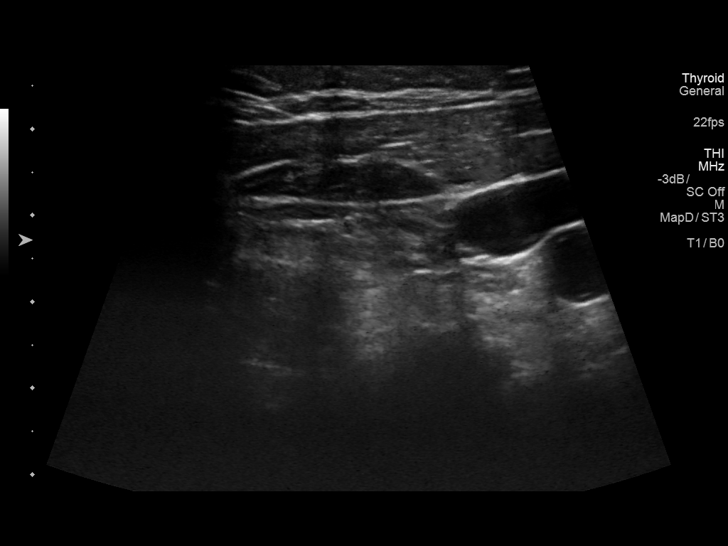

[13 of 25 positions shown; findings below may reference images not displayed]

FINDINGS: Parenchymal Echotexture: Mildly heterogenous

Isthmus: 0.4 cm, previously 0.4 cm

Right lobe: 3.4 x 1.0 x 1.0 cm, previously 3.4 x 1.0 x 1.0 cm

Left lobe: 5.7 x 2.4 x 2.9 cm, previously 4.8 x 2.2 x 3.3 cm

_________________________________________________________

Estimated total number of nodules >/= 1 cm: 1

Number of spongiform nodules >/=  2 cm not described below (TR1): 0

Number of mixed cystic and solid nodules >/= 1.5 cm not described
below (TR2): 0

_________________________________________________________

Nodule # 1:

Prior biopsy: No

Location: Left; Mid

Maximum size: 4.3 cm; Other 2 dimensions: 3.3 x 2.3 cm, previously,
4.2 x 2.2 x 3.3 cm

Composition: solid/almost completely solid (2)

Echogenicity: isoechoic (1)

Shape: not taller-than-wide (0)

Margins: smooth (0)

Echogenic foci: none (0)

ACR TI-RADS total points: 3.

ACR TI-RADS risk category:  TR3 (3 points).

Significant change in size (>/= 20% in two dimensions and minimal
increase of 2 mm): No

Change in features: No

Change in ACR TI-RADS risk category: No

ACR TI-RADS recommendations:

**Given size (>/= 2.5 cm) and appearance, fine needle aspiration of
this mildly suspicious nodule should be considered based on TI-RADS
criteria.

_________________________________________________________
IMPRESSION: Stable left lobe nodule. It continues to meet criteria for fine
needle aspiration biopsy.

The above is in keeping with the ACR TI-RADS recommendations - [HOSPITAL] 5268;[DATE].

## 2018-12-21 DIAGNOSIS — J3089 Other allergic rhinitis: Secondary | ICD-10-CM | POA: Diagnosis not present

## 2018-12-21 DIAGNOSIS — J301 Allergic rhinitis due to pollen: Secondary | ICD-10-CM | POA: Diagnosis not present

## 2018-12-22 ENCOUNTER — Ambulatory Visit
Admission: RE | Admit: 2018-12-22 | Discharge: 2018-12-22 | Disposition: A | Discharge status: Home / Self Care | Payer: Medicare Other | Source: Ambulatory Visit | Attending: Neurology | Admitting: Neurology

## 2018-12-22 ENCOUNTER — Other Ambulatory Visit: Payer: Self-pay

## 2018-12-22 DIAGNOSIS — R55 Syncope and collapse: Secondary | ICD-10-CM

## 2018-12-22 MED ORDER — GADOBENATE DIMEGLUMINE 529 MG/ML IV SOLN
17.0000 mL | Freq: Once | INTRAVENOUS | Status: AC | PRN
  Administered 2018-12-22: 16:00:00 17 mL via INTRAVENOUS

## 2018-12-23 ENCOUNTER — Encounter: Payer: Self-pay | Admitting: Registered"

## 2018-12-23 NOTE — Progress Notes (Signed)
On 12/17/2018 patient completed the Core Session 5 of Diabetes Prevention Program. By the end of this session patients are able to complete the following objectives:   Learning Objectives:  Establish a physical activity goal.  Explain the importance of the physical activity goal.  Describe their current level of physical activity.  Name ways that they are already physically active.  Develop personal plans for physical activity for the next week.   Goals:   Record weight taken outside of class.   Track foods and beverages eaten each day in the "Food and Activity Tracker," including calories and fat grams for each item.   Make an Activity Plan including date, specific type of activity, and length of time you plan to be active that includes at last 60 minutes of activity for the week.   Track activity type, minutes you were active, and distance you reached each day in the "Food and Activity Tracker."   Follow-Up Plan:  Attend Core Session 6 next week.   Bring completed "Food and Activity Tracker" next week to be reviewed by Lifestyle Coach.

## 2018-12-24 ENCOUNTER — Telehealth: Payer: Self-pay | Admitting: Pulmonary Disease

## 2018-12-24 ENCOUNTER — Encounter: Payer: Self-pay | Admitting: Registered"

## 2018-12-24 ENCOUNTER — Ambulatory Visit (HOSPITAL_BASED_OUTPATIENT_CLINIC_OR_DEPARTMENT_OTHER): Payer: Medicare Other | Admitting: Registered"

## 2018-12-24 DIAGNOSIS — R7303 Prediabetes: Secondary | ICD-10-CM | POA: Diagnosis not present

## 2018-12-24 NOTE — Progress Notes (Signed)
On 12/24/18 patient completed the Core Session 6 of Diabetes Prevention Program course. By the end of this session patients are able to complete the following objectives:   Learning Objectives:  Graph their daily physical activity.   Describe two ways of finding the time to be active.   Define "lifestyle activity."   Describe how to prevent injury.   Develop an activity plan for the coming week.   Goals:   Record weight taken outside of class.   Track foods and beverages eaten each day in the "Food and Activity Tracker," including calories and fat grams for each item.    Track activity type, minutes you were active, and distance you reached each day in the "Food and Activity Tracker."   Set aside one 20 to 30-minute block of time every day or find two or more periods of 10 to15 minutes each for physical activity.   Warm up, cool down, and stretch.  Make a Physical Activities Plan for the Week.   Follow-Up Plan:  Attend Core Session 7 next week.   Bring completed "Food and Activity Tracker" next week to be reviewed by Lifestyle Coach.

## 2018-12-24 NOTE — Telephone Encounter (Addendum)
Spoke with pt, she thinks she may have gotten a duplicate call from our office. She states someone called her to ask her Covid questions but then received another call from our office. I advised pt that I it was most likely a mistake and would check the nurse to see if there were anything additional information needed. Nothing further needed.

## 2018-12-27 ENCOUNTER — Other Ambulatory Visit: Payer: Self-pay

## 2018-12-27 ENCOUNTER — Encounter: Payer: Self-pay | Admitting: Nurse Practitioner

## 2018-12-27 ENCOUNTER — Ambulatory Visit (INDEPENDENT_AMBULATORY_CARE_PROVIDER_SITE_OTHER): Payer: Medicare Other | Admitting: Nurse Practitioner

## 2018-12-27 DIAGNOSIS — J45909 Unspecified asthma, uncomplicated: Secondary | ICD-10-CM | POA: Diagnosis not present

## 2018-12-27 NOTE — Assessment & Plan Note (Signed)
Assessment:  Severe persistent asthma Patient has been doing well with increase in Symbicort dosage No recent flares No recent use of rescue inhaler continue Singulair She may benefit from initiation of dupixent however she is not interested in it at present.  Allergic rhinitis Continue immunotherapy Singulair   Plan/Recommendations: - Continue Symbicort.  Increase dose to 160/4.5 - Albuterol as needed  Patient Instructions  - Continue Symbicort - Albuterol as needed - Continue Singulair  Follow up: Follow up with Dr. Vaughan Browner in 3 months or sooner if needed

## 2018-12-27 NOTE — Patient Instructions (Signed)
-   Continue Symbicort - Albuterol as needed - Continue Singulair  Follow up: Follow up with Dr. Vaughan Browner in 3 months or sooner if needed

## 2018-12-27 NOTE — Progress Notes (Signed)
@Patient  ID: Becky Davis, female    DOB: February 04, 1951, 68 y.o.   MRN: [id number]  Chief Complaint  Patient presents with   Follow-up    Asthma medication working great. Does not post nasal drip much anymore.    Referring provider: [name], MD  HPI 68 year old female never smoker with asthma, allergies, sleep apnea who is followed by a Dr. Vaughan Davis Patient is followed by all of our allergy is on immunotherapy allergy shots, also has a history of hypertension and thyroidectomy.  Tests: Imaging CT angio 07/27/2017- no pulmonary embolism, bronchial wall thickening. CT sinus 07/02/2018-mild mucosal thickening in the paranasal sinus. I have reviewed the images personally.  PFTs 04/12/18 FVC 2.73 [93%), FEV1 2.02 [91%), F/F 74, TLC 94%, DLCO 80% Minimal obstruction with bronchodilator response.  FENO 02/08/2018-17 FENO 04/12/2018-90 FENO 08/06/2018-13  Labs CBC 01/19/2018-WBC 7.7, eos 2.1%, absolute eosinophil count 162 CBC 06/30/2018-WBC 88, eos 2.5%, absolute eosinophil count 220 Blood allergy profile 01/19/2018-IgE 24, sensitive to dust mite.  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  Exposures: Reports remote asbestos exposure.  No mold, hot tub, Jacuzzi. Smoking history: Never smoker Travel history: Lived in [place] all her life. No recent travel due to pandemic.   OV 12/27/18 - follow up Patient presents for follow-up visit today.  She was last seen by Dr. Vaughan Davis on 08/06/2018.  She states that this is been a stable interval for her.  Has not had any exacerbations since her last visit here.  She has not had to use her rescue inhaler since March.  Her Symbicort dosage was increased at her last visit and she states that this has worked well for her.  She also has a housekeeper who has gotten a new vacuum and this seems to have helped improve symptoms as well. Denies f/c/s, n/v/d, hemoptysis, PND, leg swelling.      Allergies   Allergen Reactions   Prednisone     Had depression in 1991 after extended use of predisone. Pt has taken this medication since in a short time period, and has had no reaction.    Juniper Oil Rash   Penicillins Rash    Has patient had a PCN reaction causing immediate rash, facial/tongue/throat swelling, SOB or lightheadedness with hypotension: no Has patient had a PCN reaction causing severe rash involving mucus membranes or skin necrosis: no Has patient had a PCN reaction that required hospitalization: no Has patient had a PCN reaction occurring within the last 10 years: no If all of the above answers are "NO", then may proceed with Cephalosporin use.     Immunization History  Administered Date(s) Administered   Influenza, High Dose Seasonal PF 03/24/2017, 03/18/2018   Pneumococcal Conjugate-13 04/11/2015   Pneumococcal Polysaccharide-23 05/18/2012, 05/25/2017    Past Medical History:  Diagnosis Date   Arthritis    knees   Asthma    Headache    hx of 20 years ago   Heart murmur    hx of 10-15 years ago   Hypertension    Hypothyroidism    Pneumonia    Pre-diabetes    Thyroid nodule     Tobacco History: Social History   Tobacco Use  Smoking Status Never Smoker  Smokeless Tobacco Never Used   Counseling given: Not Answered   Outpatient Encounter Medications as of 12/27/2018  Medication Sig   albuterol (PROAIR HFA) 108 (90 Base) MCG/ACT inhaler Inhale 2 puffs into the lungs as needed (Shortness  of breath).    albuterol (PROVENTIL) (2.5 MG/3ML) 0.083% nebulizer solution Take 2.5 mg by nebulization every 4 (four) hours as needed for wheezing or shortness of breath.   amLODipine (NORVASC) 5 MG tablet Take 1 tablet by mouth daily.   ARIPiprazole (ABILIFY) 10 MG tablet Take 10 mg by mouth at bedtime.    budesonide-formoterol (SYMBICORT) 160-4.5 MCG/ACT inhaler Inhale 2 puffs into the lungs 2 (two) times daily.   calcitRIOL (ROCALTROL) 0.25 MCG capsule  Take 1 capsule by mouth 2 (two) times a day.   calcium carbonate (OSCAL) 1500 (600 Ca) MG TABS tablet Take by mouth 2 (two) times daily with a meal.   Calcium Carbonate-Vitamin D (CALTRATE 600+D PO) Take by mouth 2 (two) times daily.   levothyroxine (SYNTHROID, LEVOTHROID) 100 MCG tablet Take 100 mcg by mouth daily before breakfast.   montelukast (SINGULAIR) 10 MG tablet Take 10 mg by mouth at bedtime.   PARoxetine (PAXIL) 20 MG tablet Take 20 mg by mouth at bedtime.    potassium chloride (K-DUR) 10 MEQ tablet Take 1 tablet (10 mEq total) by mouth 2 (two) times daily.   [DISCONTINUED] calcium-vitamin D (OSCAL 500/200 D-3) 500-200 MG-UNIT tablet Take 1 tablet by mouth 2 (two) times daily for 7 days.   [DISCONTINUED] meloxicam (MOBIC) 15 MG tablet Take 15 mg by mouth daily as needed for pain.   No facility-administered encounter medications on file as of 12/27/2018.      Review of Systems  Review of Systems  Constitutional: Negative.  Negative for chills and fever.  HENT: Negative.   Respiratory: Negative for cough, shortness of breath and wheezing.   Cardiovascular: Negative.  Negative for chest pain, palpitations and leg swelling.  Gastrointestinal: Negative.   Allergic/Immunologic: Negative.   Neurological: Negative.   Psychiatric/Behavioral: Negative.        Physical Exam  BP 124/70 (BP Location: Left Arm, Patient Position: Sitting, Cuff Size: Normal)    Pulse 87    Temp 98.4 F (36.9 C)    Ht 5\' 3"  (1.6 m)    Wt 187 lb (84.8 kg)    SpO2 97%    BMI 33.13 kg/m   Wt Readings from Last 5 Encounters:  12/27/18 187 lb (84.8 kg)  10/04/18 187 lb (84.8 kg)  09/28/18 185 lb 14.4 oz (84.3 kg)  09/24/18 185 lb 14.4 oz (84.3 kg)  09/17/18 186 lb 9.6 oz (84.6 kg)     Physical Exam Vitals signs and nursing note reviewed.  Constitutional:      General: She is not in acute distress.    Appearance: She is well-developed.  Cardiovascular:     Rate and Rhythm: Normal rate and  regular rhythm.  Pulmonary:     Effort: Pulmonary effort is normal. No respiratory distress.     Breath sounds: Normal breath sounds. No wheezing or rhonchi.  Musculoskeletal:        General: No swelling.  Neurological:     Mental Status: She is alert and oriented to person, place, and time.     Imaging: Mr Virgel Paling KZ Contrast  Result Date: 12/24/2018 [facility] NEUROIMAGING REPORT STUDY DATE: 12/22/18 PATIENT NAME: MARKEYA MINCY DOB: [birth year] MRN: [id number] ORDERING CLINICIAN: Antony Contras, MD CLINICAL HISTORY: 68 year old female with syncope. EXAM: MRA head (without) TECHNIQUE: MR angiogram of the head was obtained utilizing 3D time of flight sequences from below the vertebrobasilar junction up to the intracranial vasculature without contrast.  Computerized reconstructions were obtained. CONTRAST: no  COMPARISON: none IMAGING SITE: [facility] [address] (1.5 Tesla MRI)  FINDINGS: This study is of adequate technical quality. Flow signal of the bilateral internal carotid arteries have no stenosis. The bilateral middle and anterior cerebral arteries have no stenosis. The bilateral vertebral, basilar, bilateral posterior cerebral arteries have no stenosis. Right vertebral artery is hypoplastic. No aneurysmal dilatations are seen.   Normal MRA head (without). INTERPRETING PHYSICIAN: Penni Bombard, MD Certified in Neurology, Neurophysiology and Neuroimaging [facility] [address] [place], [place] [address] [phone]   Mr Jodene Nam Neck W Wo Contrast  Result Date: 12/24/2018 [facility] NEUROIMAGING REPORT STUDY DATE: 12/22/18 PATIENT NAME: MELI FALEY DOB: 1951/06/17 MRN: [id number] ORDERING CLINICIAN: Antony Contras, MD CLINICAL HISTORY:  68 year old female with syncope. EXAM: MRA neck (with and without) TECHNIQUE: MR angiogram of the neck was obtained utilizing 3D time-of-flight sequences from the aortic  arch up to the intracranial vasculature postbolus contrast infusion.  2D time-of-flight noncontrast views and computerized reconstructions were obtained. CONTRAST: 31ml multihance COMPARISON: none IMAGING SITE: [facility] [address] (1.5 Tesla MRI)  FINDINGS: This study is of adequate technical quality.  There is anterograde flow in the bilateral vertebral and carotid arteries on 2D-TOF views.  The flow signal of the left subclavian artery has no stenosis.  The left common, internal and external carotid arteries have no stenosis.  The left vertebral artery has no stenosis from its origin up to the vertebrobasilar junction.  On the right brachiocephalic trunk and subclavian arteries have no stenosis. The right common, internal and external carotid arteries have no stenosis. The right vertebral artery has no stenosis from its origin to the vertebrobasilar junction. Limited views of the intracranial vasculature are unremarkable.   Normal MRA neck (with and without). INTERPRETING PHYSICIAN: Penni Bombard, MD Certified in Neurology, Neurophysiology and Neuroimaging [facility] [address], [place], [place] [address] [phone]   Mr Kizzie Fantasia Contrast  Result Date: 12/24/2018 [facility] NEUROIMAGING REPORT STUDY DATE: 12/22/18 PATIENT NAME: HERMENA SWINT DOB: 01/04/1951 MRN: [id number] ORDERING CLINICIAN: Antony Contras, MD CLINICAL HISTORY: 68 year old female with syncope. EXAM: MRI brain (with and without) TECHNIQUE: MRI of the brain with and without contrast was obtained utilizing 5 mm axial slices with T1, T2, T2 flair, SWI and diffusion weighted views.  T1 sagittal, T2 coronal and postcontrast views in the axial and coronal plane were obtained. CONTRAST: 59ml The patient is stable with multi-system diseases as per history noted above. COMPARISON: none IMAGING SITE: [facility] [address] (1.5 Tesla MRI)  FINDINGS: No  abnormal lesions are seen on diffusion-weighted views to suggest acute ischemia. The cortical sulci, fissures and cisterns are normal in size and appearance. Lateral, third and fourth ventricle are normal in size and appearance. No extra-axial fluid collections are seen. No evidence of mass effect or midline shift.  Mild periventricular and subcortical foci of non-specific gliosis. No abnormal lesions on post-contrast views. On sagittal views the posterior fossa, pituitary gland and corpus callosum are unremarkable. No evidence of intracranial hemorrhage on SWI views. The orbits and their contents, paranasal sinuses and calvarium are notable for mucus thickening in the ethmoid sinuses. Non-specific fluid / inflammation in the mastoid air cells.  Intracranial flow voids are present.   MRI brain (with and without) demonstrating: - Mild periventricular and subcortical foci of non-specific gliosis. - Non-specific fluid / inflammation in the mastoid air  cells. - No acute findings. INTERPRETING PHYSICIAN: Penni Bombard, MD Certified in Neurology, Neurophysiology and Neuroimaging [facility] [address] [place], [place] [address] [phone]     Assessment & Plan:   Asthma Assessment:  Severe persistent asthma Patient has been doing well with increase in Symbicort dosage No recent flares No recent use of rescue inhaler continue Singulair She may benefit from initiation of dupixent however she is not interested in it at present.  Allergic rhinitis Continue immunotherapy Singulair   Plan/Recommendations: - Continue Symbicort.  Increase dose to 160/4.5 - Albuterol as needed  Patient Instructions  - Continue Symbicort - Albuterol as needed - Continue Singulair  Follow up: Follow up with Dr. Vaughan Davis in 3 months or sooner if needed       Fenton Foy, NP 12/27/2018

## 2018-12-31 ENCOUNTER — Encounter: Payer: Medicare Other | Attending: Internal Medicine | Admitting: Registered"

## 2018-12-31 ENCOUNTER — Encounter: Payer: Self-pay | Admitting: Registered"

## 2018-12-31 DIAGNOSIS — R7303 Prediabetes: Secondary | ICD-10-CM | POA: Diagnosis not present

## 2018-12-31 NOTE — Progress Notes (Signed)
On 12/31/18 patient completed the Core Session 7 of Diabetes Prevention Program course. By the end of this session patients are able to complete the following objectives:   Learning Objectives:  Define calorie balance.  Explain how healthy eating and being active are related in terms of calorie balance.   Describe the relationship between calorie balance and weight loss.   Describe his or her progress as it relates to calorie balance.   Develop an activity plan for the coming week.   Goals:   Record weight taken outside of class.   Track foods and beverages eaten each day in the "Food and Activity Tracker," including calories and fat grams for each item.    Track activity type, minutes you were active, and distance you reached each day in the "Food and Activity Tracker."   Set aside one 20 to 30-minute block of time every day or find two or more periods of 10 to15 minutes each for physical activity.   Make a Physical Activities Plan for the Week.   Make active lifestyle choices all through the day   Stay at or go slightly over activity goal.   Follow-Up Plan:  Attend Core Session 8 next week.   Bring completed "Food and Activity Tracker" next week to be reviewed by Lifestyle Coach.

## 2019-01-03 NOTE — Telephone Encounter (Signed)
Please respond to patient

## 2019-01-03 NOTE — Telephone Encounter (Signed)
Heart rate is normal. Blood pressure as noted, is low. As preciously recommended, stop blood pressure medication if you have not. Please keep yourself hydrated. If blood pressure remains low, you will need to be seen by a provider.  Thanks Dr. Virgina Jock

## 2019-01-04 DIAGNOSIS — R42 Dizziness and giddiness: Secondary | ICD-10-CM | POA: Diagnosis not present

## 2019-01-04 DIAGNOSIS — T887XXA Unspecified adverse effect of drug or medicament, initial encounter: Secondary | ICD-10-CM | POA: Diagnosis not present

## 2019-01-04 DIAGNOSIS — I951 Orthostatic hypotension: Secondary | ICD-10-CM | POA: Diagnosis not present

## 2019-01-07 ENCOUNTER — Encounter: Payer: Medicare Other | Admitting: Registered"

## 2019-01-07 DIAGNOSIS — F4323 Adjustment disorder with mixed anxiety and depressed mood: Secondary | ICD-10-CM | POA: Diagnosis not present

## 2019-01-10 DIAGNOSIS — J301 Allergic rhinitis due to pollen: Secondary | ICD-10-CM | POA: Diagnosis not present

## 2019-01-10 DIAGNOSIS — J3089 Other allergic rhinitis: Secondary | ICD-10-CM | POA: Diagnosis not present

## 2019-01-18 DIAGNOSIS — J301 Allergic rhinitis due to pollen: Secondary | ICD-10-CM | POA: Diagnosis not present

## 2019-01-18 DIAGNOSIS — J3089 Other allergic rhinitis: Secondary | ICD-10-CM | POA: Diagnosis not present

## 2019-01-19 ENCOUNTER — Encounter: Payer: Medicare Other | Attending: Family Medicine | Admitting: Registered"

## 2019-01-19 ENCOUNTER — Encounter: Payer: Self-pay | Admitting: Registered"

## 2019-01-19 DIAGNOSIS — R7303 Prediabetes: Secondary | ICD-10-CM

## 2019-01-19 NOTE — Progress Notes (Signed)
On 01/19/2019 pt completed Core Session 8 of Diabetes Prevention Program course. By the end of this session patients are able to complete the following objectives:   Learning Objectives:  Recognize positive and negative food and activity cues.   Change negative food and activity cues to positive cues.   Add positive cues for activity and eliminate cues for inactivity.   Develop a plan for removing one problem food cue for the coming week.   Goals:   Record weight taken outside of class.   Track foods and beverages eaten each day in the "Food and Activity Tracker," including calories and fat grams for each item.    Track activity type, minutes you were active, and distance you reached each day in the "Food and Activity Tracker."   Set aside one 20 to 30-minute block of time every day or find two or more periods of 10 to15 minutes each for physical activity.   Remove one problem food cue.   Add one positive cue for being more active.  Follow-Up Plan:  Attend Core Session 9.   Bring completed "Food and Activity Tracker" next week to be reviewed by Lifestyle Coach.

## 2019-01-21 ENCOUNTER — Ambulatory Visit (HOSPITAL_BASED_OUTPATIENT_CLINIC_OR_DEPARTMENT_OTHER): Payer: Medicare Other | Admitting: Registered"

## 2019-01-21 ENCOUNTER — Encounter: Payer: Self-pay | Admitting: Registered"

## 2019-01-21 DIAGNOSIS — R7303 Prediabetes: Secondary | ICD-10-CM | POA: Diagnosis not present

## 2019-01-21 NOTE — Progress Notes (Signed)
On 01/21/2019 pt completed Core Session 9 of Diabetes Prevention Program. By the end of this session patients are able to complete the following objectives:   Learning Objectives:  List and describe five steps to problem solving.   Apply the five problem solving steps to resolve a problem he or she has with eating less fat and fewer calories or being more active.   Goals:   Record weight taken outside of class.   Track foods and beverages eaten each day in the "Food and Activity Tracker," including calories and fat grams for each item.    Track activity type, minutes you were active, and distance you reached each day in the "Food and Activity Tracker."   Set aside one 20 to 30-minute block of time every day or find two or more periods of 10 to15 minutes each for physical activity.   Use problem solving action plan created during session to problem solve.   Follow-Up Plan:  Attend Core Session 10 next week.   Bring completed "Food and Activity Tracker" next week to be reviewed by Lifestyle Coach.  Bring menus from favorite restaurants to next session for future discussion.

## 2019-01-24 ENCOUNTER — Other Ambulatory Visit: Payer: Self-pay

## 2019-01-24 ENCOUNTER — Encounter: Payer: Self-pay | Admitting: Neurology

## 2019-01-24 ENCOUNTER — Telehealth: Payer: Self-pay | Admitting: Neurology

## 2019-01-24 ENCOUNTER — Ambulatory Visit (INDEPENDENT_AMBULATORY_CARE_PROVIDER_SITE_OTHER): Payer: Medicare Other | Admitting: Neurology

## 2019-01-24 VITALS — BP 144/70 | HR 89 | Temp 98.4°F | Wt 185.0 lb

## 2019-01-24 DIAGNOSIS — M5412 Radiculopathy, cervical region: Secondary | ICD-10-CM | POA: Diagnosis not present

## 2019-01-24 DIAGNOSIS — M5010 Cervical disc disorder with radiculopathy, unspecified cervical region: Secondary | ICD-10-CM

## 2019-01-24 DIAGNOSIS — M5030 Other cervical disc degeneration, unspecified cervical region: Secondary | ICD-10-CM | POA: Diagnosis not present

## 2019-01-24 NOTE — Telephone Encounter (Signed)
medicare/bcbs supp order sent to [facility]. No auth they will reach out to the patient to schedule.  

## 2019-01-24 NOTE — Progress Notes (Signed)
[facility] [address] [place]. [place] [address] [phone]       OFFICE FOLLOW-UP NOTE  Becky Davis Date of Birth:  1951-01-26 Medical Record Number:  [id number]   HPI: Initial video consult 11/18/18 : [name] is a pleasant 68 year old Caucasian lady who is seen today for initial video consultation visit.  History is provided by the patient, her husband who was present throughout this visit as well as review of hospital medical records and discharge summary.  She has had recurrent episodes of syncope recently.  She was visiting [facility] when on 08/21/2008 she got up in the middle of the night to use the restroom and her husband heard a sound when she fell to the floor.  She was briefly unresponsive and while her husband tried to stand her up she passed out briefly again.  This happened 3 times.  She remains sleepy and disoriented.  He called 911.  She was also incontinent of urine.  There was no witnessed tonic-clonic activity or tongue biting noted.  Patient was admitted at [facility] where a CT scan of the head was obtained and I have reviewed the report which shows no acute abnormality.  Chest x-ray showed no acute cardiopulmonary disease.  The patient's vital signs were stable except blood pressure was low at 108/60.  Serum sodium was 133 and potassium was low at 3.1.  Patient was felt to be dehydrated and given IV fluids.  According to the husband the patient was not her usual self for day or 2 which was unusual.  The patient to return to [place] and on 09/28/2018 she had another episode of passing out and syncope while walking in her kitchen.  This episode was much briefer and according to the husband she regain consciousness within a few minutes and did not have a prolonged.  Of sleepiness or disorientation like the last time.  She was seen by cardiologist Dr. Joya Gaskins but worse than an got an EKG and a cardiac echo both of which were  unremarkable.  Dr. but was not felt the patient symptoms are related to orthostatic hypotension and dehydration.  She had been on hydrochlorothiazide which was discontinued and she has been taking Norvasc instead.  She has had no further episodes of syncope in the last 6 weeks.  The patient was planned to have an MRI and an EEG at [facility] but she got discharged before that could be done.  She denies currently any neurological symptoms in the form of headache, slurred speech, gait or balance problems.  She has no prior history of strokes TIAs seizures or significant head injury with loss of consciousness.  There is no family history of epilepsy either. Update 01/24/2019: She is seen today for first office follow-up visit following initial video consult on 11/18/2018.  She states she has had no further episodes of syncope or near syncope.  She has started drinking a lot of fluids and learn to get up get up slowly.  She was on Abilify 10 mg which was felt to be interfering with blood pressure medications and causing her blood pressure to drop and the dose has been reduced to 5 mg daily as well as blood pressure medications have been changed.  Patient had EEG done on 12/01/2018 which I personally reviewed and was normal.  MRI scan of the brain done on 12/24/2018 showed only minor nonspecific age-appropriate white matter changes no acute abnormality.  MRI of  the brain and neck are both unremarkable without significant large vessel stenosis occlusion or aneurysms.  Patient has a new complaint today of neck and arm pain.  She states that she probably pulled a muscle in the neck when she twisted it and noticed neck pain shooting down the side of her all the way into her little finger.  This has persisted.  She saw her primary physician who put her on a tapering dose of prednisone which seems to have helped as pain is now much less and tolerable but still there.  She denies significant weakness in the hands gait  or balance problems.  She has no prior history of neck pain, radicular pain or herniated disc.  Primary care physician did order x-rays of the neck which apparently showed degenerative disc changes at C5-6.  I do not have those reports to review today.  14 system review of systems is positive for syncope, loss of consciousness, neck pain, radicular pain, numbness, tingling and as otherwise stated in history of present illness and all other systems negative     PMH:  Past Medical History:  Diagnosis Date   Arthritis    knees   Asthma    Headache    hx of 20 years ago   Heart murmur    hx of 10-15 years ago   Hypertension    Hypothyroidism    Pneumonia    Pre-diabetes    Thyroid nodule     Social History:  Social History   Socioeconomic History   Marital status: Married    Spouse name: Not on file   Number of children: 3   Years of education: Not on file   Highest education level: Not on file  Occupational History   Not on file  Social Needs   Financial resource strain: Not on file   Food insecurity    Worry: Not on file    Inability: Not on file   Transportation needs    Medical: Not on file    Non-medical: Not on file  Tobacco Use   Smoking status: Never Smoker   Smokeless tobacco: Never Used  Substance and Sexual Activity   Alcohol use: No    Frequency: Never   Drug use: No   Sexual activity: Not Currently  Lifestyle   Physical activity    Days per week: Not on file    Minutes per session: Not on file   Stress: Not on file  Relationships   Social connections    Talks on phone: Not on file    Gets together: Not on file    Attends religious service: Not on file    Active member of club or organization: Not on file    Attends meetings of clubs or organizations: Not on file    Relationship status: Not on file   Intimate partner violence    Fear of current or ex partner: Not on file    Emotionally abused: Not on file    Physically  abused: Not on file    Forced sexual activity: Not on file  Other Topics Concern   Not on file  Social History Narrative   Not on file    Medications:   Current Outpatient Medications on File Prior to Visit  Medication Sig Dispense Refill   albuterol (PROAIR HFA) 108 (90 Base) MCG/ACT inhaler Inhale 2 puffs into the lungs as needed (Shortness of breath).      albuterol (PROVENTIL) (2.5 MG/3ML) 0.083% nebulizer solution  Take 2.5 mg by nebulization every 4 (four) hours as needed for wheezing or shortness of breath.     ARIPiprazole (ABILIFY) 10 MG tablet Take 5 mg by mouth at bedtime.      budesonide-formoterol (SYMBICORT) 160-4.5 MCG/ACT inhaler Inhale 2 puffs into the lungs 2 (two) times daily. 1 Inhaler 6   calcitRIOL (ROCALTROL) 0.25 MCG capsule Take 1 capsule by mouth 2 (two) times a day.     calcium carbonate (OSCAL) 1500 (600 Ca) MG TABS tablet Take by mouth 2 (two) times daily with a meal.     Calcium Carbonate-Vitamin D (CALTRATE 600+D PO) Take by mouth 2 (two) times daily.     levothyroxine (SYNTHROID, LEVOTHROID) 100 MCG tablet Take 100 mcg by mouth daily before breakfast.     losartan (COZAAR) 25 MG tablet TK 1 T PO QD     montelukast (SINGULAIR) 10 MG tablet Take 10 mg by mouth at bedtime.     PARoxetine (PAXIL) 20 MG tablet Take 20 mg by mouth at bedtime.      No current facility-administered medications on file prior to visit.     Allergies:   Allergies  Allergen Reactions   Prednisone     Had depression in 1991 after extended use of predisone. Pt has taken this medication since in a short time period, and has had no reaction.    Juniper Oil Rash   Penicillins Rash    Has patient had a PCN reaction causing immediate rash, facial/tongue/throat swelling, SOB or lightheadedness with hypotension: no Has patient had a PCN reaction causing severe rash involving mucus membranes or skin necrosis: no Has patient had a PCN reaction that required hospitalization:  no Has patient had a PCN reaction occurring within the last 10 years: no If all of the above answers are "NO", then may proceed with Cephalosporin use.     Physical Exam General: well developed, well nourished middle-aged Caucasian lady, seated, in no evident distress Head: head normocephalic and atraumatic.  Neck: supple with no carotid or supraclavicular bruits Cardiovascular: regular rate and rhythm, no murmurs Musculoskeletal: no deformity Skin:  no rash/petichiae Vascular:  Normal pulses all extremities Vitals:   01/24/19 1118  BP: (!) 144/70  Pulse: 89  Temp: 98.4 F (36.9 C)   Neurologic Exam Mental Status: Awake and fully alert. Oriented to place and time. Recent and remote memory intact. Attention span, concentration and fund of knowledge appropriate. Mood and affect appropriate.  Cranial Nerves: Fundoscopic exam not done. Pupils equal, briskly reactive to light. Extraocular movements full without nystagmus. Visual fields full to confrontation. Hearing intact. Facial sensation intact. Face, tongue, palate moves normally and symmetrically.  Motor: Normal bulk and tone. Normal strength in all tested extremity muscles. Sensory.: intact to touch ,pinprick .position and vibratory sensation.  Coordination: Rapid alternating movements normal in all extremities. Finger-to-nose and heel-to-shin performed accurately bilaterally. Gait and Station: Arises from chair without difficulty. Stance is normal. Gait demonstrates normal stride length and balance . Able to heel, toe and tandem walk without difficulty.  Reflexes: 1+ and symmetric. Toes downgoing.      ASSESSMENT: 68 year old lady with recurrent episodes of brief loss of consciousness likely syncopal episodes in the setting of dehydration and low blood pressure in January as well as March 2020 with negative cardiac and neurovascular work-up.  New complaints of neck and left arm and hand pain likely cervical radiculopathy from  degenerative cervical spine disease.     PLAN: I had a long discussion with  the patient regarding her new complaints of left neck and hand pain likely representing cervical radiculopathy from degenerative cervical spine disease.  I recommend she continue her course of steroids and do neck exercises regularly.  Check MRI scan of the cervical spine and EMG nerve conduction study.  Patient is refusing medications for neuropathic pain  at the present time.  I also recommend she drink adequate fluids and do orthostatic tolerance exercises to avoid fainting or near fainting episodes in the future.  She will return for follow-up in 3 months or call earlier if necessary. Greater than 50% of time during this 25 minute visit was spent on counseling,explanation of diagnosis of syncope, cervical radiculopathy, planning of further management, discussion with patient and family and coordination of care Antony Contras, MD  [facility] [address] [place], [place] [address]  Phone [phone] Fax [phone] Note: This document was prepared with digital dictation and possible smart phrase technology. Any transcriptional errors that result from this process are unintentional

## 2019-01-24 NOTE — Patient Instructions (Signed)
I had a long discussion with the patient regarding her new complaints of left neck and hand pain likely representing cervical radiculopathy from degenerative cervical spine disease.  I recommend she continue her course of steroids and do neck exercises regularly.  Check MRI scan of the cervical spine and EMG nerve conduction study.  Patient is refusing medications for neuropathic pain  at the present time.  I also recommend she drink adequate fluids and do orthostatic tolerance exercises to avoid fainting or near fainting episodes in the future.  She will return for follow-up in 3 months or call earlier if necessary.  Neck Exercises Ask your health care provider which exercises are safe for you. Do exercises exactly as told by your health care provider and adjust them as directed. It is normal to feel mild stretching, pulling, tightness, or discomfort as you do these exercises. Stop right away if you feel sudden pain or your pain gets worse. Do not begin these exercises until told by your health care provider. Neck exercises can be important for many reasons. They can improve strength and maintain flexibility in your neck, which will help your upper back and prevent neck pain. Stretching exercises Rotation neck stretching  1. Sit in a chair or stand up. 2. Place your feet flat on the floor, shoulder width apart. 3. Slowly turn your head (rotate) to the right until a slight stretch is felt. Turn it all the way to the right so you can look over your right shoulder. Do not tilt or tip your head. 4. Hold this position for 10-30 seconds. 5. Slowly turn your head (rotate) to the left until a slight stretch is felt. Turn it all the way to the left so you can look over your left shoulder. Do not tilt or tip your head. 6. Hold this position for 10-30 seconds. Repeat __________ times. Complete this exercise __________ times a day. Neck retraction 1. Sit in a sturdy chair or stand up. 2. Look straight ahead.  Do not bend your neck. 3. Use your fingers to push your chin backward (retraction). Do not bend your neck for this movement. Continue to face straight ahead. If you are doing the exercise properly, you will feel a slight sensation in your throat and a stretch at the back of your neck. 4. Hold the stretch for 1-2 seconds. Repeat __________ times. Complete this exercise __________ times a day. Strengthening exercises Neck press 1. Lie on your back on a firm bed or on the floor with a pillow under your head. 2. Use your neck muscles to push your head down on the pillow and straighten your spine. 3. Hold the position as well as you can. Keep your head facing up (in a neutral position) and your chin tucked. 4. Slowly count to 5 while holding this position. Repeat __________ times. Complete this exercise __________ times a day. Isometrics These are exercises in which you strengthen the muscles in your neck while keeping your neck still (isometrics). 1. Sit in a supportive chair and place your hand on your forehead. 2. Keep your head and face facing straight ahead. Do not flex or extend your neck while doing isometrics. 3. Push forward with your head and neck while pushing back with your hand. Hold for 10 seconds. 4. Do the sequence again, this time putting your hand against the back of your head. Use your head and neck to push backward against the hand pressure. 5. Finally, do the same exercise on either side  of your head, pushing sideways against the pressure of your hand. Repeat __________ times. Complete this exercise __________ times a day. Prone head lifts 1. Lie face-down (prone position), resting on your elbows so that your chest and upper back are raised. 2. Start with your head facing downward, near your chest. Position your chin either on or near your chest. 3. Slowly lift your head upward. Lift until you are looking straight ahead. Then continue lifting your head as far back as you can  comfortably stretch. 4. Hold your head up for 5 seconds. Then slowly lower it to your starting position. Repeat __________ times. Complete this exercise __________ times a day. Supine head lifts 1. Lie on your back (supine position), bending your knees to point to the ceiling and keeping your feet flat on the floor. 2. Lift your head slowly off the floor, raising your chin toward your chest. 3. Hold for 5 seconds. Repeat __________ times. Complete this exercise __________ times a day. Scapular retraction 1. Stand with your arms at your sides. Look straight ahead. 2. Slowly pull both shoulders (scapulae) backward and downward (retraction) until you feel a stretch between your shoulder blades in your upper back. 3. Hold for 10-30 seconds. 4. Relax and repeat. Repeat __________ times. Complete this exercise __________ times a day. Contact a health care provider if:  Your neck pain or discomfort gets much worse when you do an exercise.  Your neck pain or discomfort does not improve within 2 hours after you exercise. If you have any of these problems, stop exercising right away. Do not do the exercises again unless your health care provider says that you can. Get help right away if:  You develop sudden, severe neck pain. If this happens, stop exercising right away. Do not do the exercises again unless your health care provider says that you can. This information is not intended to replace advice given to you by your health care provider. Make sure you discuss any questions you have with your health care provider. Document Released: 06/11/2015 Document Revised: 04/28/2018 Document Reviewed: 04/28/2018 Elsevier Patient Education  2020 [employer].

## 2019-01-25 DIAGNOSIS — J3089 Other allergic rhinitis: Secondary | ICD-10-CM | POA: Diagnosis not present

## 2019-01-25 DIAGNOSIS — J301 Allergic rhinitis due to pollen: Secondary | ICD-10-CM | POA: Diagnosis not present

## 2019-01-26 DIAGNOSIS — J301 Allergic rhinitis due to pollen: Secondary | ICD-10-CM | POA: Diagnosis not present

## 2019-01-26 DIAGNOSIS — J4540 Moderate persistent asthma, uncomplicated: Secondary | ICD-10-CM | POA: Diagnosis not present

## 2019-01-26 DIAGNOSIS — J3089 Other allergic rhinitis: Secondary | ICD-10-CM | POA: Diagnosis not present

## 2019-01-28 ENCOUNTER — Ambulatory Visit (HOSPITAL_BASED_OUTPATIENT_CLINIC_OR_DEPARTMENT_OTHER): Payer: Medicare Other | Admitting: Registered"

## 2019-01-28 ENCOUNTER — Encounter: Payer: Self-pay | Admitting: Registered"

## 2019-01-28 DIAGNOSIS — R7303 Prediabetes: Secondary | ICD-10-CM | POA: Diagnosis not present

## 2019-01-28 NOTE — Progress Notes (Signed)
On 01/28/2019 pt completed the Core Session 10 of Diabetes Prevention Program. By the end of this session patients are able to complete the following objectives:   Learning Objectives:  List and describe the four keys for healthy eating out.   Give examples of how to apply these keys at the type of restaurants that the participants go to regularly.   Make an appropriate meal selection from a restaurant menu.   Demonstrate how to ask for a substitute item using assertive language and a polite tone of voice.    Goals:   Record weight taken outside of class.   Track foods and beverages eaten each day in the "Food and Activity Tracker," including calories and fat grams for each item.    Track activity type, minutes you were active, and distance you reached each day in the "Food and Activity Tracker."   Set aside one 20 to 30-minute block of time every day or find two or more periods of 10 to15 minutes each for physical activity.   Utilize positive action plan and complete questions on "To Do List."   Follow-Up Plan:  Attend Core Session 11 next week.   Bring completed "Food and Activity Tracker" next week to be reviewed by Lifestyle Coach.

## 2019-01-31 DIAGNOSIS — F4322 Adjustment disorder with anxiety: Secondary | ICD-10-CM | POA: Diagnosis not present

## 2019-02-02 DIAGNOSIS — J301 Allergic rhinitis due to pollen: Secondary | ICD-10-CM | POA: Diagnosis not present

## 2019-02-02 DIAGNOSIS — J3089 Other allergic rhinitis: Secondary | ICD-10-CM | POA: Diagnosis not present

## 2019-02-02 DIAGNOSIS — G4733 Obstructive sleep apnea (adult) (pediatric): Secondary | ICD-10-CM | POA: Diagnosis not present

## 2019-02-04 ENCOUNTER — Encounter (HOSPITAL_BASED_OUTPATIENT_CLINIC_OR_DEPARTMENT_OTHER): Payer: Medicare Other | Admitting: Registered"

## 2019-02-04 DIAGNOSIS — R7303 Prediabetes: Secondary | ICD-10-CM

## 2019-02-07 NOTE — Progress Notes (Signed)
On 02/04/2019 patient completed the Core Session 11 of Diabetes Prevention Program. By the end of this session patients are able to complete the following objectives:   Learning Objectives:  Give examples of negative thoughts that could prevent them from meeting their goals of losing weight and being more physically active.   Describe how to stop negative thoughts and talk back to them with positive thoughts.   Practice 1) stopping negative thoughts and 2) talking back to negative thoughts with positive ones.    Goals:   Record weight taken outside of class.   Track foods and beverages eaten each day in the "Food and Activity Tracker," including calories and fat grams for each item.    Track activity type, minutes you were active, and distance you reached each day in the "Food and Activity Tracker."   If you have any negative thoughts-write them in your Food and Activity Trackers, along with how you talked back to them. Practice stopping negative thoughts and talking back to them with positive thoughts.   Follow-Up Plan:  Attend Core Session 12 next week.   Bring completed "Food and Activity Tracker" next week to be reviewed by Lifestyle Coach.

## 2019-02-08 ENCOUNTER — Encounter: Payer: Self-pay | Admitting: Registered"

## 2019-02-14 DIAGNOSIS — J301 Allergic rhinitis due to pollen: Secondary | ICD-10-CM | POA: Diagnosis not present

## 2019-02-14 DIAGNOSIS — J3089 Other allergic rhinitis: Secondary | ICD-10-CM | POA: Diagnosis not present

## 2019-02-16 ENCOUNTER — Other Ambulatory Visit: Payer: Self-pay

## 2019-02-16 ENCOUNTER — Ambulatory Visit
Admission: RE | Admit: 2019-02-16 | Discharge: 2019-02-16 | Disposition: A | Discharge status: Home / Self Care | Payer: Medicare Other | Source: Ambulatory Visit | Attending: Neurology | Admitting: Neurology

## 2019-02-16 DIAGNOSIS — M5030 Other cervical disc degeneration, unspecified cervical region: Secondary | ICD-10-CM | POA: Diagnosis not present

## 2019-02-16 DIAGNOSIS — J3089 Other allergic rhinitis: Secondary | ICD-10-CM | POA: Diagnosis not present

## 2019-02-16 DIAGNOSIS — J301 Allergic rhinitis due to pollen: Secondary | ICD-10-CM | POA: Diagnosis not present

## 2019-02-18 ENCOUNTER — Encounter: Payer: Medicare Other | Admitting: Registered"

## 2019-02-21 DIAGNOSIS — J3089 Other allergic rhinitis: Secondary | ICD-10-CM | POA: Diagnosis not present

## 2019-02-21 DIAGNOSIS — J301 Allergic rhinitis due to pollen: Secondary | ICD-10-CM | POA: Diagnosis not present

## 2019-02-22 ENCOUNTER — Telehealth (HOSPITAL_COMMUNITY): Payer: Self-pay | Admitting: Neurology

## 2019-02-22 NOTE — Telephone Encounter (Signed)
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  Patient stated that her symptoms were getting better and she did not want referral to spine surgeon at the present time.  I advised him to call me in case her symptoms got worse.  I recommend she keep her upcoming appointment for EMG nerve conduction study as well as follow-up appointment with me.  They voiced understanding.

## 2019-02-25 ENCOUNTER — Encounter: Payer: Self-pay | Admitting: Registered"

## 2019-02-25 ENCOUNTER — Encounter: Payer: Medicare Other | Attending: Family Medicine | Admitting: Registered"

## 2019-02-25 DIAGNOSIS — R7303 Prediabetes: Secondary | ICD-10-CM | POA: Insufficient documentation

## 2019-02-25 DIAGNOSIS — J3089 Other allergic rhinitis: Secondary | ICD-10-CM | POA: Diagnosis not present

## 2019-02-25 DIAGNOSIS — J301 Allergic rhinitis due to pollen: Secondary | ICD-10-CM | POA: Diagnosis not present

## 2019-02-25 NOTE — Progress Notes (Signed)
On 02/25/2019 pt completed Core Session 14 of Diabetes Prevention Program. By the end of this session patients are able to complete the following objectives:   Learning Objectives:  Give examples of problem social cues and helpful social cues.   Explain how to remove problem social cues and add helpful ones.   Describe ways of coping with vacations and social events such as parties, holidays, and visits from relatives and friends.   Create an action plan to change a problem social cue and add a helpful one.   Goals:   Record weight taken outside of class.   Track foods and beverages eaten each day in the "Food and Activity Tracker," including calories and fat grams for each item.    Track activity type, minutes you were active, and distance you reached each day in the "Food and Activity Tracker."   Do your best to reach activity goal for the week.  Use action plan created during session to change a problem social cue and add a helpful social cue.   Answer questions regarding success of changing social cues on "To Do Next Week" handout.   Follow-Up Plan:  Attend Core Session 15 next week.   Email completed "Food and Activity Tracker" next week to be reviewed by Lifestyle Coach.

## 2019-03-01 DIAGNOSIS — J3089 Other allergic rhinitis: Secondary | ICD-10-CM | POA: Diagnosis not present

## 2019-03-01 DIAGNOSIS — J301 Allergic rhinitis due to pollen: Secondary | ICD-10-CM | POA: Diagnosis not present

## 2019-03-03 DIAGNOSIS — R739 Hyperglycemia, unspecified: Secondary | ICD-10-CM | POA: Diagnosis not present

## 2019-03-03 DIAGNOSIS — M5412 Radiculopathy, cervical region: Secondary | ICD-10-CM | POA: Diagnosis not present

## 2019-03-03 DIAGNOSIS — E039 Hypothyroidism, unspecified: Secondary | ICD-10-CM | POA: Diagnosis not present

## 2019-03-03 DIAGNOSIS — I10 Essential (primary) hypertension: Secondary | ICD-10-CM | POA: Diagnosis not present

## 2019-03-03 DIAGNOSIS — K219 Gastro-esophageal reflux disease without esophagitis: Secondary | ICD-10-CM | POA: Diagnosis not present

## 2019-03-04 ENCOUNTER — Encounter: Payer: Self-pay | Admitting: Registered"

## 2019-03-04 ENCOUNTER — Ambulatory Visit (HOSPITAL_BASED_OUTPATIENT_CLINIC_OR_DEPARTMENT_OTHER): Payer: Medicare Other | Admitting: Registered"

## 2019-03-04 ENCOUNTER — Encounter: Payer: Medicare Other | Admitting: Registered"

## 2019-03-04 DIAGNOSIS — R7303 Prediabetes: Secondary | ICD-10-CM

## 2019-03-04 NOTE — Progress Notes (Signed)
On 03/04/2019 patient completed Core Session 15 of Diabetes Prevention Program course with [facility]. By the end of this session patients are able to complete the following objectives:   Learning Objectives:  Explain how to prevent stress or cope with unavoidable stress.   Describe how this program can be a source of stress.   Explain how to manage stressful situations.   Create and follow an action plan for either preventing or coping with a stressful situation.   Goals:   Record weight taken outside of class.   Track foods and beverages eaten each day in the "Food and Activity Tracker," including calories and fat grams for each item.    Track activity type, minutes you were active, and distance you reached each day in the "Food and Activity Tracker."   Do your best to reach activity goal for the week.  Follow your action plan to reduce stress.   Answer questions on handout regarding success of action plan.   Follow-Up Plan:  Attend Core Session 16 next week.   Email completed "Food and Activity Tracker" to Lifestyle Coach next week to be reviewed.

## 2019-03-08 DIAGNOSIS — Z23 Encounter for immunization: Secondary | ICD-10-CM | POA: Diagnosis not present

## 2019-03-10 ENCOUNTER — Encounter: Payer: Self-pay | Admitting: Registered"

## 2019-03-10 DIAGNOSIS — M5412 Radiculopathy, cervical region: Secondary | ICD-10-CM | POA: Diagnosis not present

## 2019-03-10 DIAGNOSIS — R739 Hyperglycemia, unspecified: Secondary | ICD-10-CM | POA: Diagnosis not present

## 2019-03-10 DIAGNOSIS — E039 Hypothyroidism, unspecified: Secondary | ICD-10-CM | POA: Diagnosis not present

## 2019-03-10 DIAGNOSIS — I10 Essential (primary) hypertension: Secondary | ICD-10-CM | POA: Diagnosis not present

## 2019-03-10 DIAGNOSIS — K219 Gastro-esophageal reflux disease without esophagitis: Secondary | ICD-10-CM | POA: Diagnosis not present

## 2019-03-10 NOTE — Progress Notes (Signed)
On 03/04/2019 pt completed the Core Session 12 of Diabetes Prevention Program course with [facility]. By the end of this session patients are able to complete the following objectives:   Learning Objectives:  Describe their current progress toward defined goals.  Describe common causes for slipping from healthy eating or being  active.  Explain what to do to get back on their feet after a slip.  Goals:   Record weight taken outside of class.   Track foods and beverages eaten each day in the "Food and Activity Tracker," including calories and fat grams for each item.    Track activity type, minutes active, and distance reached each day in the "Food and Activity Tracker."   Try out the two action plans created during session- "Slips from Healthy Eating: Action Plan" and "Slips from Being Active: Action Plan"  Answer questions on the handout.   Follow-Up Plan:  Attend Core Session 13 next week.   Email completed "Food and Activity Tracker" next week to Lifestyle Coach for review.

## 2019-03-11 ENCOUNTER — Encounter (HOSPITAL_BASED_OUTPATIENT_CLINIC_OR_DEPARTMENT_OTHER): Payer: Medicare Other | Admitting: Registered"

## 2019-03-11 ENCOUNTER — Encounter: Payer: Self-pay | Admitting: Registered"

## 2019-03-11 DIAGNOSIS — R7303 Prediabetes: Secondary | ICD-10-CM

## 2019-03-11 NOTE — Progress Notes (Signed)
On 03/11/2019 pt completed Core Session 16 of Diabetes Prevention Program course with [facility]. By the end of this session patients are able to complete the following objectives:   Learning Objectives:  Measure their progress toward weight and physical activity goals since Session 1.   Develop a plan for improving progress, if their goals have not yet been attained.   Describe ways to stay motivated long-term.   Goals:   Record weight taken outside of class.   Track foods and beverages eaten each day in the "Food and Activity Tracker," including calories and fat grams for each item.    Track activity type, minutes you were active, and distance you reached each day in the "Food and Activity Tracker."   Utilize action plan to help stay motivated and complete questions on "To Do List."   Follow-Up Plan:  Attend session 17 in two weeks.   Email completed "Food and Activity Tracker" to Lifestyle [facility] by next session to be reviewed.

## 2019-03-14 ENCOUNTER — Ambulatory Visit (INDEPENDENT_AMBULATORY_CARE_PROVIDER_SITE_OTHER): Payer: Medicare Other | Admitting: Neurology

## 2019-03-14 ENCOUNTER — Other Ambulatory Visit: Payer: Self-pay

## 2019-03-14 ENCOUNTER — Encounter: Payer: Self-pay | Admitting: Neurology

## 2019-03-14 DIAGNOSIS — M5010 Cervical disc disorder with radiculopathy, unspecified cervical region: Secondary | ICD-10-CM

## 2019-03-14 DIAGNOSIS — M5412 Radiculopathy, cervical region: Secondary | ICD-10-CM

## 2019-03-14 DIAGNOSIS — R55 Syncope and collapse: Secondary | ICD-10-CM

## 2019-03-14 NOTE — Progress Notes (Signed)
Please refer to EMG and nerve conduction procedure note.  

## 2019-03-14 NOTE — Procedures (Signed)
     HISTORY:  Becky Davis is a 68 year old patient with a history of neck and shoulder pain and pain down the left arm.  She reports discomfort for several months, she denies any significant weakness of the arms.  She is being evaluated for possible neuropathy or a cervical radiculopathy.  NERVE CONDUCTION STUDIES:  Nerve conduction studies were performed on both upper extremities.  The distal motor latencies for the median nerves were prolonged bilaterally with normal motor amplitude seen for these nerves bilaterally.  The distal motor latencies and motor amplitudes for the ulnar nerves were normal bilaterally.  Slight slowing was seen for the right median nerve, normal for the left median nerve and normal for the ulnar nerves bilaterally.  The sensory latencies for the median nerves were prolonged bilaterally.  The sensory latencies for the ulnar nerves were normal bilaterally.  The F-wave latencies for the ulnar nerves were normal bilaterally.  EMG STUDIES:  EMG study was performed on the left upper extremity:  The first dorsal interosseous muscle reveals 2 to 4 K units with full recruitment. No fibrillations or positive waves were noted. The abductor pollicis brevis muscle reveals 2 to 4 K units with full recruitment. No fibrillations or positive waves were noted. The extensor indicis proprius muscle reveals 1 to 3 K units with full recruitment. No fibrillations or positive waves were noted. The pronator teres muscle reveals 2 to 3 K units with full recruitment. No fibrillations or positive waves were noted. The biceps muscle reveals 1 to 2 K units with full recruitment. No fibrillations or positive waves were noted. The triceps muscle reveals 2 to 4 K units with full recruitment. No fibrillations or positive waves were noted. The anterior deltoid muscle reveals 2 to 3 K units with full recruitment. No fibrillations or positive waves were noted. The cervical paraspinal muscles were  tested at 2 levels. No abnormalities of insertional activity were seen at either level tested. There was poor relaxation.   IMPRESSION:  Nerve conduction studies done on both upper extremities shows evidence of bilateral carpal tunnel syndrome of mild to moderate severity on the right and mild severity on the left.  EMG evaluation of the left upper extremity was unremarkable, there is no evidence of an overlying cervical radiculopathy.  Jill Alexanders MD 03/14/2019 9:12 AM  [facility] [address] [place], [place] [address]  Phone [phone] Fax [phone]

## 2019-03-15 NOTE — Progress Notes (Signed)
[place]    Nerve / Sites Muscle Latency Ref. Amplitude Ref. Rel Amp Segments Distance Velocity Ref. Area    ms ms mV mV %  cm m/s m/s mVms  R Median - APB     Wrist APB 5.2 ?4.4 6.5 ?4.0 100 Wrist - APB 7   25.6     Upper arm APB 9.6  7.7  118 Upper arm - Wrist 20 45 ?49 31.2  L Median - APB     Wrist APB 4.2 ?4.4 7.1 ?4.0 100 Wrist - APB 7   26.0     Upper arm APB 8.4  6.5  91.7 Upper arm - Wrist 21 50 ?49 23.8  R Ulnar - ADM     Wrist ADM 2.1 ?3.3 10.5 ?6.0 100 Wrist - ADM 7   29.8     B.Elbow ADM 5.2  8.9  85.1 B.Elbow - Wrist 17 55 ?49 28.0     A.Elbow ADM 7.0  8.7  97.8 A.Elbow - B.Elbow 10 53 ?49 27.3         A.Elbow - Wrist      L Ulnar - ADM     Wrist ADM 2.2 ?3.3 9.1 ?6.0 100 Wrist - ADM 7   28.7     B.Elbow ADM 5.3  8.0  88.2 B.Elbow - Wrist 18 60 ?49 28.1     A.Elbow ADM 7.3  7.9  97.6 A.Elbow - B.Elbow 10 49 ?49 29.1         A.Elbow - Wrist                 SNC    Nerve / Sites Rec. Site Peak Lat Ref.  Amp Ref. Segments Distance    ms ms V V  cm  R Median - Orthodromic (Dig II, Mid palm)     Dig II Wrist 4.4 ?3.4 5 ?10 Dig II - Wrist 13  L Median - Orthodromic (Dig II, Mid palm)     Dig II Wrist 4.0 ?3.4 11 ?10 Dig II - Wrist 13  R Ulnar - Orthodromic, (Dig V, Mid palm)     Dig V Wrist 2.6 ?3.1 8 ?5 Dig V - Wrist 11  L Ulnar - Orthodromic, (Dig V, Mid palm)     Dig V Wrist 2.7 ?3.1 6 ?5 Dig V - Wrist 80              F  Wave    Nerve F Lat Ref.   ms ms  R Ulnar - ADM 25.6 ?32.0  L Ulnar - ADM 26.7 ?32.0

## 2019-03-16 DIAGNOSIS — M1711 Unilateral primary osteoarthritis, right knee: Secondary | ICD-10-CM | POA: Diagnosis not present

## 2019-03-24 DIAGNOSIS — J3089 Other allergic rhinitis: Secondary | ICD-10-CM | POA: Diagnosis not present

## 2019-03-24 DIAGNOSIS — J301 Allergic rhinitis due to pollen: Secondary | ICD-10-CM | POA: Diagnosis not present

## 2019-03-25 ENCOUNTER — Encounter: Payer: Medicare Other | Attending: Family Medicine | Admitting: Registered"

## 2019-03-25 DIAGNOSIS — R7303 Prediabetes: Secondary | ICD-10-CM | POA: Diagnosis not present

## 2019-03-29 NOTE — Progress Notes (Signed)
@Patient  ID: Becky Davis, female    DOB: 1951-05-02, 68 y.o.   MRN: [id number]  Chief Complaint  Patient presents with  . Follow-up    Reports that her breathing is doing well. Denies chest tightness, wheezing, coughing or shortness of breath.    Referring provider: [name], MD  HPI: 68 year old female, never smoked.  Past medical history significant for severe persistent asthma, sleep apnea, depression, thyroid nodule.  Patient of Dr. Vaughan Browner, last seen by nurse practitioner on 12/27/2018. Follows with Dr. Harold Hedge from [facility], receives allergy shots q2 weeks.  Maintained on Symbicort 160 and Singulair 10 mg daily. May benefit from initiation of Dupixent in the future.   03/30/2019 Patient presents today for 77-month follow-up. Not the best historian. She is doing well in terms of her breathing. Continues Symbicort 160 as prescribed, she has not needed to use Albuterol hfa often (approx 1-2 times a week at night). Wears CPAP at night and reports compliance- managed by [facility]. Reports that she has not had any recent URI/bronchitis infections and believes it's d/t wearing mask. No recent antibiotics. She was given prednisone for pinched nerve/radiculopathy in May 2020.   She was in the hospital in February/March in [place] for vasovagal syncope. States that they changed her blood pressure medication.  Increased losartan and took away diuretic. Follows with neurology Dr. Jannifer Franklin.    Data Reviewed: Imaging CT angio 07/27/2017- no pulmonary embolism, bronchial wall thickening. CT sinus 07/02/2018-mild mucosal thickening in the paranasal sinus. I have reviewed the images personally.  PFTs 04/12/18 FVC 2.73 [93%), FEV1 2.02 [91%), F/F 74, TLC 94%, DLCO 80% Minimal obstruction with bronchodilator response.  FENO 02/08/2018-17 FENO 04/12/2018-90 FENO 08/06/2018-13  Labs CBC 01/19/2018-WBC 7.7, eos 2.1%, absolute eosinophil count 162 CBC  06/30/2018-WBC 88, eos 2.5%, absolute eosinophil count 220 Blood allergy profile 01/19/2018-IgE 24, sensitive to dust mite.  Allergies  Allergen Reactions  . Prednisone     Had depression in 1991 after extended use of predisone. Pt has taken this medication since in a short time period, and has had no reaction.   Huel Cote Oil Rash  . Penicillins Rash    Has patient had a PCN reaction causing immediate rash, facial/tongue/throat swelling, SOB or lightheadedness with hypotension: no Has patient had a PCN reaction causing severe rash involving mucus membranes or skin necrosis: no Has patient had a PCN reaction that required hospitalization: no Has patient had a PCN reaction occurring within the last 10 years: no If all of the above answers are "NO", then may proceed with Cephalosporin use.     Immunization History  Administered Date(s) Administered  . Influenza, High Dose Seasonal PF 03/24/2017, 03/18/2018, 03/08/2019  . Pneumococcal Conjugate-13 04/11/2015  . Pneumococcal Polysaccharide-23 05/18/2012, 05/25/2017    Past Medical History:  Diagnosis Date  . Arthritis    knees  . Asthma   . Headache    hx of 20 years ago  . Heart murmur    hx of 10-15 years ago  . Hypertension   . Hypothyroidism   . Pneumonia   . Pre-diabetes   . Thyroid nodule     Tobacco History: Social History   Tobacco Use  Smoking Status Never Smoker  Smokeless Tobacco Never Used   Counseling given: Not Answered   Outpatient Medications Prior to Visit  Medication Sig Dispense Refill  . albuterol (PROVENTIL) (2.5 MG/3ML) 0.083% nebulizer solution Take 2.5 mg by nebulization every 4 (four) hours  as needed for wheezing or shortness of breath.    . ARIPiprazole (ABILIFY) 10 MG tablet Take 5 mg by mouth at bedtime.     . budesonide-formoterol (SYMBICORT) 160-4.5 MCG/ACT inhaler Inhale 2 puffs into the lungs 2 (two) times daily. 1 Inhaler 6  . calcitRIOL (ROCALTROL) 0.25 MCG capsule Take 1 capsule by  mouth 2 (two) times a day.    . calcium carbonate (OSCAL) 1500 (600 Ca) MG TABS tablet Take by mouth 2 (two) times daily with a meal.    . Calcium Carbonate-Vitamin D (CALTRATE 600+D PO) Take by mouth 2 (two) times daily.    Marland Kitchen levothyroxine (SYNTHROID, LEVOTHROID) 100 MCG tablet Take 100 mcg by mouth daily before breakfast.    . losartan (COZAAR) 50 MG tablet Take 50 mg by mouth daily.    . montelukast (SINGULAIR) 10 MG tablet Take 10 mg by mouth at bedtime.    Marland Kitchen PARoxetine (PAXIL) 20 MG tablet Take 20 mg by mouth at bedtime.     Marland Kitchen albuterol (PROAIR HFA) 108 (90 Base) MCG/ACT inhaler Inhale 2 puffs into the lungs as needed (Shortness of breath).     . losartan (COZAAR) 25 MG tablet TK 1 T PO QD     No facility-administered medications prior to visit.    Review of Systems  Review of Systems  Constitutional: Negative.   HENT: Negative.   Respiratory: Negative for cough, shortness of breath and wheezing.    Physical Exam  BP 128/88 (BP Location: Left Arm, Cuff Size: Large)   Pulse 64   Ht 5\' 3"  (1.6 m)   Wt 190 lb (86.2 kg)   SpO2 96%   BMI 33.66 kg/m  Physical Exam Constitutional:      Appearance: Normal appearance.  HENT:     Head: Normocephalic and atraumatic.  Cardiovascular:     Rate and Rhythm: Normal rate and regular rhythm.  Pulmonary:     Effort: Pulmonary effort is normal.     Breath sounds: Normal breath sounds. No wheezing, rhonchi or rales.     Comments: CTA Musculoskeletal: Normal range of motion.  Neurological:     General: No focal deficit present.     Mental Status: She is alert and oriented to person, place, and time. Mental status is at baseline.  Psychiatric:        Mood and Affect: Mood normal.        Behavior: Behavior normal.        Thought Content: Thought content normal.        Judgment: Judgment normal.      Lab Results:  CBC    Component Value Date/Time   WBC 9.1 09/28/2018 0831   RBC 3.80 (L) 09/28/2018 0831   HGB 11.5 (L) 09/28/2018  0831   HCT 33.9 (L) 09/28/2018 0831   PLT 215 09/28/2018 0831   MCV 89.2 09/28/2018 0831   MCH 30.3 09/28/2018 0831   MCHC 33.9 09/28/2018 0831   RDW 12.2 09/28/2018 0831   LYMPHSABS 1.8 09/28/2018 0831   MONOABS 0.8 09/28/2018 0831   EOSABS 0.1 09/28/2018 0831   BASOSABS 0.0 09/28/2018 0831    BMET    Component Value Date/Time   NA 145 09/28/2018 0831   K 2.9 (L) 09/28/2018 0831   CL 81 (L) 09/28/2018 0831   CO2 19 (L) 09/28/2018 0831   GLUCOSE 106 (H) 09/28/2018 0831   BUN 20 09/28/2018 0831   CREATININE 0.68 09/28/2018 0831   CALCIUM 5.5 (LL) 09/28/2018 0831  GFRNONAA >60 09/28/2018 0831   GFRAA >60 09/28/2018 0831    BNP No results found for: BNP  ProBNP No results found for: PROBNP  Imaging: No results found.   Assessment & Plan:   Asthma - Stable interval, if not improved - Continue Symbicort 160 two puffs twice daily and Singulair 10mg  at bedtime - Refilling Albuterol rescue inhaler - take 2 puffs every 6 hours prn breakthrough shortness of breath/wheezing  - Follow-up 6 months with Dr. Vaughan Browner   OSA on CPAP - Reports compliance with CPAP - Managed by [facility]   Vasovagal syncope - Hospitalized this past year for syncope - BP medications adjusted - Following with Dr. Jannifer Franklin with neurology    Martyn Ehrich, NP 03/30/2019

## 2019-03-30 ENCOUNTER — Other Ambulatory Visit: Payer: Self-pay

## 2019-03-30 ENCOUNTER — Encounter: Payer: Self-pay | Admitting: Primary Care

## 2019-03-30 ENCOUNTER — Ambulatory Visit: Payer: Medicare Other | Admitting: Nurse Practitioner

## 2019-03-30 ENCOUNTER — Encounter: Payer: Self-pay | Admitting: Registered"

## 2019-03-30 ENCOUNTER — Ambulatory Visit (INDEPENDENT_AMBULATORY_CARE_PROVIDER_SITE_OTHER): Payer: Medicare Other | Admitting: Primary Care

## 2019-03-30 DIAGNOSIS — G4733 Obstructive sleep apnea (adult) (pediatric): Secondary | ICD-10-CM

## 2019-03-30 DIAGNOSIS — Z9989 Dependence on other enabling machines and devices: Secondary | ICD-10-CM | POA: Diagnosis not present

## 2019-03-30 DIAGNOSIS — R55 Syncope and collapse: Secondary | ICD-10-CM | POA: Diagnosis not present

## 2019-03-30 MED ORDER — ALBUTEROL SULFATE HFA 108 (90 BASE) MCG/ACT IN AERS: 2.0000 | INHALATION_SPRAY | RESPIRATORY_TRACT | 3 refills | Status: AC | PRN

## 2019-03-30 NOTE — Assessment & Plan Note (Signed)
-   Reports compliance with CPAP - Managed by [facility] and associates

## 2019-03-30 NOTE — Assessment & Plan Note (Addendum)
-   Stable interval, if not improved - Continue Symbicort 160 two puffs twice daily and Singulair 10mg  at bedtime - Refilling Albuterol rescue inhaler - take 2 puffs every 6 hours prn breakthrough shortness of breath/wheezing  - Follow-up 6 months with Dr. Vaughan Browner

## 2019-03-30 NOTE — Assessment & Plan Note (Signed)
-   Hospitalized this past year for syncope - BP medications adjusted - Following with Dr. Jannifer Franklin with neurology

## 2019-03-30 NOTE — Patient Instructions (Addendum)
Continue Symbicort two puffs twice daily  Continue Singulair 10mg  at bedtime  Refilling Albuterol rescue inhaler   Follow-up 6 months with Dr. Vaughan Browner    Asthma, Adult  Asthma is a long-term (chronic) condition in which the airways get tight and narrow. The airways are the breathing passages that lead from the nose and mouth down into the lungs. A person with asthma will have times when symptoms get worse. These are called asthma attacks. They can cause coughing, whistling sounds when you breathe (wheezing), shortness of breath, and chest pain. They can make it hard to breathe. There is no cure for asthma, but medicines and lifestyle changes can help control it. There are many things that can bring on an asthma attack or make asthma symptoms worse (triggers). Common triggers include:  Mold.  Dust.  Cigarette smoke.  Cockroaches.  Things that can cause allergy symptoms (allergens). These include animal skin flakes (dander) and pollen from trees or grass.  Things that pollute the air. These may include household cleaners, wood smoke, smog, or chemical odors.  Cold air, weather changes, and wind.  Crying or laughing hard.  Stress.  Certain medicines or drugs.  Certain foods such as dried fruit, potato chips, and grape juice.  Infections, such as a cold or the flu.  Certain medical conditions or diseases.  Exercise or tiring activities. Asthma may be treated with medicines and by staying away from the things that cause asthma attacks. Types of medicines may include:  Controller medicines. These help prevent asthma symptoms. They are usually taken every day.  Fast-acting reliever or rescue medicines. These quickly relieve asthma symptoms. They are used as needed and provide short-term relief.  Allergy medicines if your attacks are brought on by allergens.  Medicines to help control the body's defense (immune) system. Follow these instructions at home: Avoiding triggers in  your home  Change your heating and air conditioning filter often.  Limit your use of fireplaces and wood stoves.  Get rid of pests (such as roaches and mice) and their droppings.  Throw away plants if you see mold on them.  Clean your floors. Dust regularly. Use cleaning products that do not smell.  Have someone vacuum when you are not home. Use a vacuum cleaner with a HEPA filter if possible.  Replace carpet with wood, tile, or vinyl flooring. Carpet can trap animal skin flakes and dust.  Use allergy-proof pillows, mattress covers, and box spring covers.  Wash bed sheets and blankets every week in hot water. Dry them in a dryer.  Keep your bedroom free of any triggers.  Avoid pets and keep windows closed when things that cause allergy symptoms are in the air.  Use blankets that are made of polyester or cotton.  Clean bathrooms and kitchens with bleach. If possible, have someone repaint the walls in these rooms with mold-resistant paint. Keep out of the rooms that are being cleaned and painted.  Wash your hands often with soap and water. If soap and water are not available, use hand sanitizer.  Do not allow anyone to smoke in your home. General instructions  Take over-the-counter and prescription medicines only as told by your doctor. ? Talk with your doctor if you have questions about how or when to take your medicines. ? Make note if you need to use your medicines more often than usual.  Do not use any products that contain nicotine or tobacco, such as cigarettes and e-cigarettes. If you need help quitting, ask your  doctor.  Stay away from secondhand smoke.  Avoid doing things outdoors when allergen counts are high and when air quality is low.  Wear a ski mask when doing outdoor activities in the winter. The mask should cover your nose and mouth. Exercise indoors on cold days if you can.  Warm up before you exercise. Take time to cool down after exercise.  Use a peak  flow meter as told by your doctor. A peak flow meter is a tool that measures how well the lungs are working.  Keep track of the peak flow meter's readings. Write them down.  Follow your asthma action plan. This is a written plan for taking care of your asthma and treating your attacks.  Make sure you get all the shots (vaccines) that your doctor recommends. Ask your doctor about a flu shot and a pneumonia shot.  Keep all follow-up visits as told by your doctor. This is important. Contact a doctor if:  You have wheezing, shortness of breath, or a cough even while taking medicine to prevent attacks.  The mucus you cough up (sputum) is thicker than usual.  The mucus you cough up changes from clear or white to yellow, green, gray, or bloody.  You have problems from the medicine you are taking, such as: ? A rash. ? Itching. ? Swelling. ? Trouble breathing.  You need reliever medicines more than 2-3 times a week.  Your peak flow reading is still at 50-79% of your personal best after following the action plan for 1 hour.  You have a fever. Get help right away if:  You seem to be worse and are not responding to medicine during an asthma attack.  You are short of breath even at rest.  You get short of breath when doing very little activity.  You have trouble eating, drinking, or talking.  You have chest pain or tightness.  You have a fast heartbeat.  Your lips or fingernails start to turn blue.  You are light-headed or dizzy, or you faint.  Your peak flow is less than 50% of your personal best.  You feel too tired to breathe normally. Summary  Asthma is a long-term (chronic) condition in which the airways get tight and narrow. An asthma attack can make it hard to breathe.  Asthma cannot be cured, but medicines and lifestyle changes can help control it.  Make sure you understand how to avoid triggers and how and when to use your medicines. This information is not  intended to replace advice given to you by your health care provider. Make sure you discuss any questions you have with your health care provider. Document Released: 12/17/2007 Document Revised: 09/02/2018 Document Reviewed: 08/04/2016 Elsevier Patient Education  2020 [place].  Asthma and Physical Activity Physical activity is an important part of a healthy lifestyle. If you have asthma, it is important to exercise because physical activity can help you to:  Control your asthma.  Maintain your weight or lose weight.  Increase your energy.  Decrease stress and anxiety.  Lower your risk of getting sick.  Improve your heart health. However, asthma symptoms can flare up when you are physically active or exercising. You can learn how to control your asthma and prevent symptoms during exercise. This will help you to remain physically active. How can asthma affect my ability to be physically active? When you have asthma, physical activity can cause you to have symptoms such as:  Wheezing. This may sound like whistling while  breathing.  A feeling of tightness in the chest.  Sore throat.  Coughing.  Shortness of breath.  Tiredness (fatigue) with minimal activity.  Increased sputum production.  Chest pain. What actions can I take to prevent asthma problems during physical activity? Pulmonary rehabilitation Enroll in a pulmonary rehabilitation program. Benefits of this type of program include:  Education on lung diseases.  Classes that teach you how to exercise and be more active while decreasing your shortness of breath.  A group setting that allows you to talk with others who have asthma. Asthma action plan Follow the asthma action plan set by your health care provider. Your personal asthma plan may include:  Taking your medicines as told by your health care provider.  Avoiding your asthma triggers, except physical activity. Triggers may include cold air, dust, pollen,  pet dander, and air pollution.  Tracking your asthma control.  Using a peak flow meter.  Being aware of worsening symptoms.  Knowing when to seek emergency care. Proper breathing During exercise, follow these tips for proper breathing:  Breathe in before starting the exercise and breathe out during the hardest part of the exercise.  Take slow breaths.  Pace yourself and do not try to go too fast.  While breathing out, purse your lips. Before beginning any exercise program or new activity, talk with your health care provider. Medicines If physical activity triggers your asthma, your health care provider may order the following medicines:  A rescue inhaler (short-acting beta2-agonist) for you to use shortly before physical activity or exercise. Its effects may reduce exercise-related symptoms for 2-3 hours.  A long-acting beta2-agonist that can offer up to 12 hours of relief if taken daily.  Leukotriene modifiers. These pills are taken several hours before physical activity or exercise to help prevent asthma symptoms that are caused by exercise.  Long-term control medicines. These will be given if you have severe or frequent asthma symptoms during or after exercise. These symptoms may also mean that your asthma is not well controlled.  General information  Exercise indoors when the air is dry or during allergy season.  Try to breathe in warm, moist air by wearing a scarf over your nose and mouth or breathing only through your nose.  Spend a few minutes warming up before your workout.  Cool down after exercise. What should I do if my asthma symptoms get worse? Contact your health care provider if your asthma symptoms are getting worse. Your asthma is getting worse if:  You have symptoms more often.  Your symptoms are more severe.  Your symptoms get worse at night and make you lose sleep.  Your peak flow number is lower than your personal best or changes a lot from day to  day.  Your asthma medicines do not work as well as they used to.  You use your rescue inhaler more often. If you use your rescue inhaler more than 2 days a week, your asthma is not well controlled.  You go to the emergency room or see your health care provider because of an asthma attack. Where to find support  Ask your health care provider about signing up for a pulmonary rehabilitation program.  Ask your health care provider about asthma support groups.  Visit your local community health department.  Check out local hospitals' community health programs. Where can I get more information?  Your health care provider.  American Lung Association: lung.org  National Heart, Lung, and Blood Institute: https://www.[place].biz/ Contact a health care  provider if:  You have trouble walking and talking because you are out of breath. Get help right away if:  Your lips or fingernails are blue.  You are not able to breathe or catch your breath. Summary  Physical activity is an important part of a healthy lifestyle. However, if you have asthma, your symptoms can flare up during exercise or physical activity.  You can prevent problems during physical activity by doing pulmonary rehabilitation, following an asthma action plan, doing proper breathing, and using medicines.  Talk with your health care provider before starting any exercise program or new activity. This information is not intended to replace advice given to you by your health care provider. Make sure you discuss any questions you have with your health care provider. Document Released: 09/15/2017 Document Revised: 10/19/2018 Document Reviewed: 09/15/2017 Elsevier Patient Education  2020 [employer].

## 2019-03-30 NOTE — Progress Notes (Signed)
On 03/25/2019 patient completed Session 17 of Diabetes Prevention Program course virtually with [facility]. The following learning objectives were met by the patient during this class:   Learning Objectives:  Identify how to maintain and/or continue working toward program goals for the remainder of the program.   Describe ways that food and activity tracking can assist them in maintaining/reaching program goals.   Identify progress they have made since the beginning of the program.   Goals:   Record weight taken outside of class.   Track foods and beverages eaten each day in the "Food and Activity Tracker," including calories and fat grams for each item.    Track activity type, minutes you were active, and distance you reached each day in the "Food and Activity Tracker."   Follow-Up Plan:  Attend session 18 in two weeks.   E-mail completed "Food and Activity Tracker" to [facility]estyle [facility] next week before class

## 2019-04-15 ENCOUNTER — Encounter: Payer: Self-pay | Admitting: Registered"

## 2019-04-15 ENCOUNTER — Encounter: Payer: Medicare Other | Attending: Family Medicine | Admitting: Registered"

## 2019-04-15 DIAGNOSIS — R7303 Prediabetes: Secondary | ICD-10-CM | POA: Insufficient documentation

## 2019-04-15 NOTE — Progress Notes (Signed)
On 04/15/2019 pt completed Session 18 of Diabetes Prevention Program course with [facility]. By the end of this session patients are able to complete the following objectives:   Learning Objectives:  Explain how glucose is used in the body and it's relationship with insulin/insulin resistance.   Identify symptoms of diabetes.   Describe lab tests used to diagnose diabetes.   Describe health complications and conditions related to diabetes.   Goals:   Record weight taken outside of class.   Track foods and beverages eaten each day in the "Food and Activity Tracker," including calories and fat grams for each item.    Track activity type, minutes you were active, and distance you reached each day in the "Food and Activity Tracker."   Follow-Up Plan:  Attend session 19 in two weeks.   Email completed "Food and Activity Trackers" to [facility] for review.

## 2019-04-18 DIAGNOSIS — J3089 Other allergic rhinitis: Secondary | ICD-10-CM | POA: Diagnosis not present

## 2019-04-18 DIAGNOSIS — J301 Allergic rhinitis due to pollen: Secondary | ICD-10-CM | POA: Diagnosis not present

## 2019-04-22 ENCOUNTER — Ambulatory Visit (HOSPITAL_BASED_OUTPATIENT_CLINIC_OR_DEPARTMENT_OTHER): Payer: Medicare Other | Admitting: Registered"

## 2019-04-22 ENCOUNTER — Encounter: Payer: Self-pay | Admitting: Registered"

## 2019-04-22 DIAGNOSIS — R7303 Prediabetes: Secondary | ICD-10-CM | POA: Diagnosis not present

## 2019-04-22 NOTE — Progress Notes (Signed)
On 04/22/2019 patient completed Session 19 of Diabetes Prevention Program course virtually with [facility]. By the end of this session patients are able to complete the following objectives:   Learning Objectives:  List ways to make recipes healthier.   List lower-fat and lower-calorie substitutions for common ingredients.   Identify low-fat cooking methods.   Describe how to choose a cookbook that works best for their needs.   Goals:   Record weight taken outside of class.   Track foods and beverages eaten each day in the "Food and Activity Tracker," including calories and fat grams for each item.    Track activity type, minutes you were active, and distance you reached each day in the "Food and Activity Tracker."   Follow-Up Plan:  Attend session 20 in 2 weeks.   Email completed "Food and Activity Trackers" to be reviewed by [facility].

## 2019-04-28 DIAGNOSIS — I10 Essential (primary) hypertension: Secondary | ICD-10-CM | POA: Diagnosis not present

## 2019-04-28 DIAGNOSIS — M5412 Radiculopathy, cervical region: Secondary | ICD-10-CM | POA: Diagnosis not present

## 2019-04-28 DIAGNOSIS — K219 Gastro-esophageal reflux disease without esophagitis: Secondary | ICD-10-CM | POA: Diagnosis not present

## 2019-04-28 DIAGNOSIS — E039 Hypothyroidism, unspecified: Secondary | ICD-10-CM | POA: Diagnosis not present

## 2019-04-28 DIAGNOSIS — R739 Hyperglycemia, unspecified: Secondary | ICD-10-CM | POA: Diagnosis not present

## 2019-05-05 ENCOUNTER — Ambulatory Visit (INDEPENDENT_AMBULATORY_CARE_PROVIDER_SITE_OTHER): Payer: Medicare Other | Admitting: Neurology

## 2019-05-05 ENCOUNTER — Other Ambulatory Visit: Payer: Self-pay

## 2019-05-05 ENCOUNTER — Encounter: Payer: Self-pay | Admitting: Neurology

## 2019-05-05 VITALS — BP 124/77 | HR 64 | Temp 97.4°F | Wt 196.0 lb

## 2019-05-05 DIAGNOSIS — J3089 Other allergic rhinitis: Secondary | ICD-10-CM | POA: Diagnosis not present

## 2019-05-05 DIAGNOSIS — G5603 Carpal tunnel syndrome, bilateral upper limbs: Secondary | ICD-10-CM

## 2019-05-05 DIAGNOSIS — J301 Allergic rhinitis due to pollen: Secondary | ICD-10-CM | POA: Diagnosis not present

## 2019-05-05 NOTE — Patient Instructions (Signed)
I had a long discussion with the patient and her husband regarding her hand paresthesias and discussed results of MRI scan cervical spine and EMG nerve conduction studies.  I recommend she wear her wrist extension splint and limit activities which involve rapid repetitive wrist flexion movements.  Her symptoms are not severe enough at the present time to justify trial of medications but I advised her to call me if the situation changes.  She was advised to keep her self well-hydrated.  I do not believe further evaluation for syncope is indicated as its been more than 6 months since the last episode.  She may return for follow-up in the future only as needed and no  schedule appointment was made

## 2019-05-05 NOTE — Progress Notes (Signed)
[facility] [address] [place]. [place] [address] [phone]       OFFICE FOLLOW-UP NOTE  [name] Date of Birth:  1951-01-26 Medical Record Number:  [id number]   HPI: Initial video consult 11/18/18 : Becky Davis is a pleasant 68 year old Caucasian lady who is seen today for initial video consultation visit.  History is provided by the patient, her husband who was present throughout this visit as well as review of hospital medical records and discharge summary.  She has had recurrent episodes of syncope recently.  She was visiting [facility] when on 08/21/2008 she got up in the middle of the night to use the restroom and her husband heard a sound when she fell to the floor.  She was briefly unresponsive and while her husband tried to stand her up she passed out briefly again.  This happened 3 times.  She remains sleepy and disoriented.  He called 911.  She was also incontinent of urine.  There was no witnessed tonic-clonic activity or tongue biting noted.  Patient was admitted at [facility] where a CT scan of the head was obtained and I have reviewed the report which shows no acute abnormality.  Chest x-ray showed no acute cardiopulmonary disease.  The patient's vital signs were stable except blood pressure was low at 108/60.  Serum sodium was 133 and potassium was low at 3.1.  Patient was felt to be dehydrated and given IV fluids.  According to the husband the patient was not her usual self for day or 2 which was unusual.  The patient to return to [place] and on 09/28/2018 she had another episode of passing out and syncope while walking in her kitchen.  This episode was much briefer and according to the husband she regain consciousness within a few minutes and did not have a prolonged.  Of sleepiness or disorientation like the last time.  She was seen by cardiologist Dr. Joya Gaskins but worse than an got an EKG and a cardiac echo both of which were  unremarkable.  Dr. but was not felt the patient symptoms are related to orthostatic hypotension and dehydration.  She had been on hydrochlorothiazide which was discontinued and she has been taking Norvasc instead.  She has had no further episodes of syncope in the last 6 weeks.  The patient was planned to have an MRI and an EEG at [facility] but she got discharged before that could be done.  She denies currently any neurological symptoms in the form of headache, slurred speech, gait or balance problems.  She has no prior history of strokes TIAs seizures or significant head injury with loss of consciousness.  There is no family history of epilepsy either. Update 01/24/2019: She is seen today for first office follow-up visit following initial video consult on 11/18/2018.  She states she has had no further episodes of syncope or near syncope.  She has started drinking a lot of fluids and learn to get up get up slowly.  She was on Abilify 10 mg which was felt to be interfering with blood pressure medications and causing her blood pressure to drop and the dose has been reduced to 5 mg daily as well as blood pressure medications have been changed.  Patient had EEG done on 12/01/2018 which I personally reviewed and was normal.  MRI scan of the brain done on 12/24/2018 showed only minor nonspecific age-appropriate white matter changes no acute abnormality.  MRI of  the brain and neck are both unremarkable without significant large vessel stenosis occlusion or aneurysms.  Patient has a new complaint today of neck and arm pain.  She states that she probably pulled a muscle in the neck when she twisted it and noticed neck pain shooting down the side of her all the way into her little finger.  This has persisted.  She saw her primary physician who put her on a tapering dose of prednisone which seems to have helped as pain is now much less and tolerable but still there.  She denies significant weakness in the hands gait  or balance problems.  She has no prior history of neck pain, radicular pain or herniated disc.  Primary care physician did order x-rays of the neck which apparently showed degenerative disc changes at C5-6.  I do not have those reports to review today. Update 05/05/2019 : She returns for follow-up after last visit 3 months ago.  She is accompanied by her husband.  She has had no further episodes of syncope or near syncope, no greater than 6 months.  She continues to have mild paresthesias neck pain on the left but this is not bothersome.  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  There is however no definite root impingement.  EMG nerve conduction study done by Dr. 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  Patient states she is retired and she is does not do a lot of activities in which there is a preoperative restriction movements.  She feels her neck pain and hand paresthesias are not bothersome.  She has no new complaints  14 system review of systems is positive for syncope, loss of consciousness, neck pain, radicular pain, numbness, tingling and as otherwise stated in history of present illness and all other systems negative     PMH:  Past Medical History:  Diagnosis Date   Arthritis    knees   Asthma    Headache    hx of 20 years ago   Heart murmur    hx of 10-15 years ago   Hypertension    Hypothyroidism    Pneumonia    Pre-diabetes    Thyroid nodule     Social History:  Social History   Socioeconomic History   Marital status: Married    Spouse name: Not on file   Number of children: 3   Years of education: Not on file   Highest education level: Not on file  Occupational History   Not on file  Social Needs   Financial resource strain: Not on file   Food insecurity    Worry: Not  on file    Inability: Not on file   Transportation needs    Medical: Not on file    Non-medical: Not on file  Tobacco Use   Smoking status: Never Smoker   Smokeless tobacco: Never Used  Substance and Sexual Activity   Alcohol use: No    Frequency: Never   Drug use: No   Sexual activity: Not Currently  Lifestyle   Physical activity    Days per week: Not on file    Minutes per session: Not on file   Stress: Not on file  Relationships   Social connections    Talks on phone: Not on file    Gets together: Not on file  Attends religious service: Not on file    Active member of club or organization: Not on file    Attends meetings of clubs or organizations: Not on file    Relationship status: Not on file   Intimate partner violence    Fear of current or ex partner: Not on file    Emotionally abused: Not on file    Physically abused: Not on file    Forced sexual activity: Not on file  Other Topics Concern   Not on file  Social History Narrative   Not on file    Medications:   Current Outpatient Medications on File Prior to Visit  Medication Sig Dispense Refill   albuterol (PROAIR HFA) 108 (90 Base) MCG/ACT inhaler Inhale 2 puffs into the lungs as needed (Shortness of breath). 6.7 g 3   albuterol (PROVENTIL) (2.5 MG/3ML) 0.083% nebulizer solution Take 2.5 mg by nebulization every 4 (four) hours as needed for wheezing or shortness of breath.     ARIPiprazole (ABILIFY) 10 MG tablet Take 5 mg by mouth at bedtime.      budesonide-formoterol (SYMBICORT) 160-4.5 MCG/ACT inhaler Inhale 2 puffs into the lungs 2 (two) times daily. 1 Inhaler 6   Calcium Carbonate-Vitamin D (CALTRATE 600+D PO) Take 500 mg by mouth 2 (two) times daily.      levothyroxine (SYNTHROID, LEVOTHROID) 100 MCG tablet Take 100 mcg by mouth daily before breakfast.     losartan (COZAAR) 50 MG tablet Take 50 mg by mouth daily.     montelukast (SINGULAIR) 10 MG tablet Take 10 mg by mouth at  bedtime.     PARoxetine (PAXIL) 20 MG tablet Take 20 mg by mouth at bedtime.      No current facility-administered medications on file prior to visit.     Allergies:   Allergies  Allergen Reactions   Prednisone     Had depression in 1991 after extended use of predisone. Pt has taken this medication since in a short time period, and has had no reaction.    Juniper Oil Rash   Penicillins Rash    Has patient had a PCN reaction causing immediate rash, facial/tongue/throat swelling, SOB or lightheadedness with hypotension: no Has patient had a PCN reaction causing severe rash involving mucus membranes or skin necrosis: no Has patient had a PCN reaction that required hospitalization: no Has patient had a PCN reaction occurring within the last 10 years: no If all of the above answers are "NO", then may proceed with Cephalosporin use.     Physical Exam General: well developed, well nourished middle-aged Caucasian lady, seated, in no evident distress Head: head normocephalic and atraumatic.  Neck: supple with no carotid or supraclavicular bruits Cardiovascular: regular rate and rhythm, no murmurs Musculoskeletal: no deformity Skin:  no rash/petichiae Vascular:  Normal pulses all extremities Vitals:   05/05/19 0923  BP: 124/77  Pulse: 64  Temp: (!) 97.4 F (36.3 C)   Neurologic Exam Mental Status: Awake and fully alert. Oriented to place and time. Recent and remote memory intact. Attention span, concentration and fund of knowledge appropriate. Mood and affect appropriate.  Cranial Nerves: Fundoscopic exam not done. Pupils equal, briskly reactive to light. Extraocular movements full without nystagmus. Visual fields full to confrontation. Hearing intact. Facial sensation intact. Face, tongue, palate moves normally and symmetrically.  Motor: Normal bulk and tone. Normal strength in all tested extremity muscles. Sensory.: intact to touch ,pinprick .position and vibratory sensation.    Coordination: Rapid alternating movements normal in all  extremities. Finger-to-nose and heel-to-shin performed accurately bilaterally. Gait and Station: Arises from chair without difficulty. Stance is normal. Gait demonstrates normal stride length and balance . Able to heel, toe and tandem walk without difficulty.  Reflexes: 1+ and symmetric. Toes downgoing.      ASSESSMENT: 68 year old lady with recurrent episodes of brief loss of consciousness likely syncopal episodes in the setting of dehydration and low blood pressure in January as well as March 2020 with negative cardiac and neurovascular work-up.  New complaints of neck and left arm and hand pain likely cervical radiculopathy as well as carpal tunnel  from degenerative cervical spine disease.     PLAN: I had a long discussion with the patient and her husband regarding her hand paresthesias and discussed results of MRI scan cervical spine and EMG nerve conduction studies.  I recommend she wear her wrist extension splint and limit activities which involve rapid repetitive wrist flexion movements.  Her symptoms are not severe enough at the present time to justify trial of medications but I advised her to call me if the situation changes.  She was advised to keep her self well-hydrated.  I do not believe further evaluation for syncope is indicated as its been more than 6 months since the last episode.  She may return for follow-up in the future only as needed and no  schedule appointment was made Greater than 50% of time during this 25 minute visit was spent on counseling,explanation of diagnosis of syncope, carpal tunnel planning of further management, discussion with patient and family and coordination of care Antony Contras, MD  [facility] [address] [place], [place] [address]  Phone [phone] Fax [phone] Note: This document was prepared with digital dictation and possible smart phrase  technology. Any transcriptional errors that result from this process are unintentional

## 2019-05-06 ENCOUNTER — Encounter (HOSPITAL_BASED_OUTPATIENT_CLINIC_OR_DEPARTMENT_OTHER): Payer: Medicare Other | Admitting: Registered"

## 2019-05-06 DIAGNOSIS — R7303 Prediabetes: Secondary | ICD-10-CM | POA: Diagnosis not present

## 2019-05-09 DIAGNOSIS — I10 Essential (primary) hypertension: Secondary | ICD-10-CM | POA: Diagnosis not present

## 2019-05-09 DIAGNOSIS — R739 Hyperglycemia, unspecified: Secondary | ICD-10-CM | POA: Diagnosis not present

## 2019-05-11 NOTE — Progress Notes (Signed)
On 05/06/2019 patient completed a session of the Diabetes Prevention Program course virtually with [facility]. By the end of this session patients are able to complete the following objectives:   Learning Objectives:  Define fiber and describe the difference between insoluble and soluble fiber   List foods that are good sources of fiber  Explain the health benefits of fiber   Describe ways to increase volume of meals and snacks while staying within fat goal.   Goals:   Record weight taken outside of class.   Track foods and beverages eaten each day in the "Food and Activity Tracker," including calories and fat grams for each item.    Track activity type, minutes you were active, and distance you reached each day in the "Food and Activity Tracker."   Follow-Up Plan:  Attend next session.   Email completed "Food and Activity Trackers" to be reviewed by Lifestyle Coach.

## 2019-05-16 ENCOUNTER — Ambulatory Visit: Payer: Medicare Other | Admitting: Neurology

## 2019-05-16 DIAGNOSIS — M1711 Unilateral primary osteoarthritis, right knee: Secondary | ICD-10-CM | POA: Diagnosis not present

## 2019-05-19 DIAGNOSIS — J301 Allergic rhinitis due to pollen: Secondary | ICD-10-CM | POA: Diagnosis not present

## 2019-05-19 DIAGNOSIS — J3089 Other allergic rhinitis: Secondary | ICD-10-CM | POA: Diagnosis not present

## 2019-05-20 DIAGNOSIS — F4323 Adjustment disorder with mixed anxiety and depressed mood: Secondary | ICD-10-CM | POA: Diagnosis not present

## 2019-05-23 DIAGNOSIS — M1711 Unilateral primary osteoarthritis, right knee: Secondary | ICD-10-CM | POA: Diagnosis not present

## 2019-05-27 ENCOUNTER — Encounter: Payer: Medicare Other | Attending: Family Medicine | Admitting: Registered"

## 2019-05-27 ENCOUNTER — Encounter: Payer: Self-pay | Admitting: Registered"

## 2019-05-27 DIAGNOSIS — R7303 Prediabetes: Secondary | ICD-10-CM | POA: Diagnosis not present

## 2019-05-27 NOTE — Progress Notes (Signed)
On 05/27/2019 patient completed Diabetes Prevention Program course virtually with [facility]. By the end of this session patients are able to complete the following objectives:   Learning Objectives:  List 9 ways to to stay on track during special events/occasions.  Create a plan to avoid a food problem at an upcoming special event/occasion  Describe ways to make time for healthy behaviors during special events/occasions   Goals:   Record weight taken outside of class.   Track foods and beverages eaten each day in the "Food and Activity Tracker," including calories and fat grams for each item.    Track activity type, minutes you were active, and distance you reached each day in the "Food and Activity Tracker."   Follow-Up Plan:  Attend next session.   Email completed "Food and Activity Trackers" to next session to be reviewed by Lifestyle Coach.

## 2019-05-30 DIAGNOSIS — M1711 Unilateral primary osteoarthritis, right knee: Secondary | ICD-10-CM | POA: Diagnosis not present

## 2019-05-31 DIAGNOSIS — J3089 Other allergic rhinitis: Secondary | ICD-10-CM | POA: Diagnosis not present

## 2019-05-31 DIAGNOSIS — J301 Allergic rhinitis due to pollen: Secondary | ICD-10-CM | POA: Diagnosis not present

## 2019-06-02 DIAGNOSIS — E041 Nontoxic single thyroid nodule: Secondary | ICD-10-CM | POA: Diagnosis not present

## 2019-06-02 DIAGNOSIS — E876 Hypokalemia: Secondary | ICD-10-CM | POA: Diagnosis not present

## 2019-06-02 DIAGNOSIS — E039 Hypothyroidism, unspecified: Secondary | ICD-10-CM | POA: Diagnosis not present

## 2019-06-02 DIAGNOSIS — E209 Hypoparathyroidism, unspecified: Secondary | ICD-10-CM | POA: Diagnosis not present

## 2019-06-06 ENCOUNTER — Other Ambulatory Visit: Payer: Self-pay

## 2019-06-06 DIAGNOSIS — Z20822 Contact with and (suspected) exposure to covid-19: Secondary | ICD-10-CM

## 2019-06-06 DIAGNOSIS — Z20828 Contact with and (suspected) exposure to other viral communicable diseases: Secondary | ICD-10-CM | POA: Diagnosis not present

## 2019-06-07 LAB — NOVEL CORONAVIRUS, NAA: SARS-CoV-2, NAA: NOT DETECTED

## 2019-06-16 DIAGNOSIS — H524 Presbyopia: Secondary | ICD-10-CM | POA: Diagnosis not present

## 2019-06-16 DIAGNOSIS — H25013 Cortical age-related cataract, bilateral: Secondary | ICD-10-CM | POA: Diagnosis not present

## 2019-06-16 DIAGNOSIS — H2513 Age-related nuclear cataract, bilateral: Secondary | ICD-10-CM | POA: Diagnosis not present

## 2019-06-20 DIAGNOSIS — J301 Allergic rhinitis due to pollen: Secondary | ICD-10-CM | POA: Diagnosis not present

## 2019-06-20 DIAGNOSIS — J3089 Other allergic rhinitis: Secondary | ICD-10-CM | POA: Diagnosis not present

## 2019-06-24 ENCOUNTER — Encounter: Payer: Medicare Other | Attending: Family Medicine | Admitting: Registered"

## 2019-06-24 DIAGNOSIS — R7303 Prediabetes: Secondary | ICD-10-CM | POA: Insufficient documentation

## 2019-06-25 ENCOUNTER — Encounter: Payer: Self-pay | Admitting: Registered"

## 2019-06-25 NOTE — Progress Notes (Signed)
On 06/24/2019 pt completed a post core session of the Diabetes Prevention Program course virtually with [facility]. By the end of this session patients are able to complete the following objectives:   Learning Objectives:  Describe the difference between a lapse and a relapse.  List steps to prevent a lapse from becoming a relapse.   Identify situations that increase risk of having a lapse.   Make a plan to help prevent lapses and recover after a lapse has occurred.   Goals:   Record weight taken outside of class.   Track foods and beverages eaten each day in the "Food and Activity Tracker," including calories and fat grams for each item.    Track activity type, minutes you were active, and distance you reached each day in the "Food and Activity Tracker."   Follow-Up Plan:  Attend next session.   Email completed "Food and Activity Trackers" before next session to be reviewed by Lifestyle Coach.

## 2019-07-22 ENCOUNTER — Encounter: Payer: Medicare Other | Attending: Family Medicine | Admitting: Registered"

## 2019-07-22 ENCOUNTER — Encounter: Payer: Self-pay | Admitting: Registered"

## 2019-07-22 DIAGNOSIS — R7303 Prediabetes: Secondary | ICD-10-CM | POA: Insufficient documentation

## 2019-07-22 NOTE — Progress Notes (Signed)
On 07/22/19 pt completed a session of the Diabetes Prevention Program course virtually with [facility]. By the end of this session patients are able to complete the following objectives:   Learning Objectives:  Describe the importance of having regular meals each day and how skipping meals can negatively affect food choices and weight.   Plan out balanced meals and snacks.  List ways to avoid unplanned snacking.   Goals:   Record weight taken outside of class.   Track foods and beverages eaten each day in the "Food and Activity Tracker," including calories and fat grams for each item.    Track activity type, minutes you were active, and distance you reached each day in the "Food and Activity Tracker."   Follow-Up Plan:  Attend next session.   Email completed "Food and Activity Trackers" before next session to be reviewed by Lifestyle Coach.

## 2019-07-25 DIAGNOSIS — Z20828 Contact with and (suspected) exposure to other viral communicable diseases: Secondary | ICD-10-CM | POA: Diagnosis not present

## 2019-08-02 DIAGNOSIS — J3089 Other allergic rhinitis: Secondary | ICD-10-CM | POA: Diagnosis not present

## 2019-08-02 DIAGNOSIS — J301 Allergic rhinitis due to pollen: Secondary | ICD-10-CM | POA: Diagnosis not present

## 2019-08-15 DIAGNOSIS — J3089 Other allergic rhinitis: Secondary | ICD-10-CM | POA: Diagnosis not present

## 2019-08-15 DIAGNOSIS — J301 Allergic rhinitis due to pollen: Secondary | ICD-10-CM | POA: Diagnosis not present

## 2019-08-15 DIAGNOSIS — J4540 Moderate persistent asthma, uncomplicated: Secondary | ICD-10-CM | POA: Diagnosis not present

## 2019-08-31 DIAGNOSIS — J301 Allergic rhinitis due to pollen: Secondary | ICD-10-CM | POA: Diagnosis not present

## 2019-08-31 DIAGNOSIS — J3089 Other allergic rhinitis: Secondary | ICD-10-CM | POA: Diagnosis not present

## 2019-09-02 ENCOUNTER — Encounter: Payer: Self-pay | Admitting: Registered"

## 2019-09-02 ENCOUNTER — Encounter: Payer: Medicare Other | Attending: Family Medicine | Admitting: Registered"

## 2019-09-02 DIAGNOSIS — R7303 Prediabetes: Secondary | ICD-10-CM | POA: Insufficient documentation

## 2019-09-02 NOTE — Progress Notes (Signed)
On 09/02/19 patient completed a post core session of the Diabetes Prevention Program virtually with [facility]. By the end of this session patients are able to complete the following objectives:   Learning Objectives: Marland Kitchen List indoor physical activity options.  . Identify any barriers to being active and brainstorm how to overcome barriers.  . Describe short and long-term health benefits of physical activity.   Goals:   Record weight taken outside of class.   Track foods and beverages eaten each day in the "Food and Activity Tracker," including calories and fat grams for each item.    Track activity type, minutes you were active, and distance you reached each day in the "Food and Activity Tracker."   Follow-Up Plan:  Attend next session.   Email completed "Food and Activity Trackers" before next session to be reviewed by Lifestyle Coach.

## 2019-09-03 ENCOUNTER — Ambulatory Visit: Payer: Medicare Other

## 2019-09-05 DIAGNOSIS — M170 Bilateral primary osteoarthritis of knee: Secondary | ICD-10-CM | POA: Diagnosis not present

## 2019-09-09 DIAGNOSIS — J301 Allergic rhinitis due to pollen: Secondary | ICD-10-CM | POA: Diagnosis not present

## 2019-09-09 DIAGNOSIS — J3089 Other allergic rhinitis: Secondary | ICD-10-CM | POA: Diagnosis not present

## 2019-09-21 DIAGNOSIS — M170 Bilateral primary osteoarthritis of knee: Secondary | ICD-10-CM | POA: Diagnosis not present

## 2019-09-23 ENCOUNTER — Encounter: Payer: Self-pay | Admitting: Registered"

## 2019-09-23 ENCOUNTER — Encounter: Payer: Medicare Other | Attending: Family Medicine | Admitting: Registered"

## 2019-09-23 DIAGNOSIS — R7303 Prediabetes: Secondary | ICD-10-CM

## 2019-09-23 NOTE — Progress Notes (Signed)
On 09/23/19 pt completed a post core session of the Diabetes Prevention Program course virtually with [facility]. By the end of this session patients are able to complete the following objectives:   Learning Objectives:  Describe how to incorporate more fruits and vegetables into meals.  List criteria for selecting good quality fruits and vegetables at the store.   Define mindful eating.  List the benefits of eating mindfully.   Goals:   Record weight taken outside of class.   Track foods and beverages eaten each day in the "Food and Activity Tracker," including calories and fat grams for each item.    Track activity type, minutes you were active, and distance you reached each day in the "Food and Activity Tracker."   Follow-Up Plan:  Attend next session.   Email completed "Food and Activity Trackers" before next session to be reviewed by Lifestyle Coach.

## 2019-10-03 DIAGNOSIS — J301 Allergic rhinitis due to pollen: Secondary | ICD-10-CM | POA: Diagnosis not present

## 2019-10-03 DIAGNOSIS — J3089 Other allergic rhinitis: Secondary | ICD-10-CM | POA: Diagnosis not present

## 2019-10-06 DIAGNOSIS — F4323 Adjustment disorder with mixed anxiety and depressed mood: Secondary | ICD-10-CM | POA: Diagnosis not present

## 2019-10-18 DIAGNOSIS — J3089 Other allergic rhinitis: Secondary | ICD-10-CM | POA: Diagnosis not present

## 2019-10-18 DIAGNOSIS — J301 Allergic rhinitis due to pollen: Secondary | ICD-10-CM | POA: Diagnosis not present

## 2019-10-21 ENCOUNTER — Encounter: Payer: Medicare Other | Attending: Family Medicine | Admitting: Registered"

## 2019-10-21 ENCOUNTER — Encounter: Payer: Self-pay | Admitting: Registered"

## 2019-10-21 DIAGNOSIS — R7303 Prediabetes: Secondary | ICD-10-CM

## 2019-10-21 NOTE — Progress Notes (Signed)
On 10/21/19 pt completed a post core session of the Diabetes Prevention Program course at [facility]. By the end of this session patients are able to complete the following objectives:   Learning Objectives:  Counter self-defeating thoughts with positive self-statements  Define assertiveness.   List examples of ways to practice assertiveness.   Goals:   Record weight taken outside of class.   Track foods and beverages eaten each day in the "Food and Activity Tracker," including calories and fat grams for each item.    Track activity type, minutes you were active, and distance you reached each day in the "Food and Activity Tracker."   Follow-Up Plan:  Attend next session.   Email completed "Food and Activity Trackers" before next session to be reviewed by Lifestyle Coach.

## 2019-10-31 DIAGNOSIS — J301 Allergic rhinitis due to pollen: Secondary | ICD-10-CM | POA: Diagnosis not present

## 2019-10-31 DIAGNOSIS — J3089 Other allergic rhinitis: Secondary | ICD-10-CM | POA: Diagnosis not present

## 2019-11-01 DIAGNOSIS — J301 Allergic rhinitis due to pollen: Secondary | ICD-10-CM | POA: Diagnosis not present

## 2019-11-01 DIAGNOSIS — J3089 Other allergic rhinitis: Secondary | ICD-10-CM | POA: Diagnosis not present

## 2019-11-03 ENCOUNTER — Other Ambulatory Visit: Payer: Self-pay

## 2019-11-03 ENCOUNTER — Ambulatory Visit (INDEPENDENT_AMBULATORY_CARE_PROVIDER_SITE_OTHER): Payer: Medicare Other | Admitting: Pulmonary Disease

## 2019-11-03 ENCOUNTER — Encounter: Payer: Self-pay | Admitting: Pulmonary Disease

## 2019-11-03 VITALS — BP 126/78 | HR 68 | Temp 97.8°F | Ht 63.0 in | Wt 195.6 lb

## 2019-11-03 DIAGNOSIS — Z9989 Dependence on other enabling machines and devices: Secondary | ICD-10-CM | POA: Diagnosis not present

## 2019-11-03 DIAGNOSIS — J3089 Other allergic rhinitis: Secondary | ICD-10-CM | POA: Diagnosis not present

## 2019-11-03 DIAGNOSIS — G4733 Obstructive sleep apnea (adult) (pediatric): Secondary | ICD-10-CM

## 2019-11-03 DIAGNOSIS — J45909 Unspecified asthma, uncomplicated: Secondary | ICD-10-CM

## 2019-11-03 DIAGNOSIS — J301 Allergic rhinitis due to pollen: Secondary | ICD-10-CM | POA: Diagnosis not present

## 2019-11-03 NOTE — Patient Instructions (Addendum)
I am glad breathing is doing well Continue the inhalers and Singulair We will clear you for surgery Follow-up in 1 year.

## 2019-11-03 NOTE — Progress Notes (Signed)
Becky Davis    AL:[id number]    Apr 26, 1951  Primary Care Physician:Wong, Edwyna Shell, MD  Referring Physician: [name], MD [address] [place],  [place] [address]  Chief complaint: Follow-up for asthma  HPI: 69 year old with hypertension, asthma, allergies, sleep apnea, thyroidectomy Follows with [facility] and is on immunotherapy, allergy shots.  She was seen at her allergy office in early July for exacerbation given a prednisone taper.  Prior to that he was seen at [facility] on ED in January 2019 for intermittent hemoptysis, asthma exacerbation treated with azithromycin and steroids.  CT did not show pulmonary embolism, lung mass or nodule. She had another exacerbation in December 2019 requiring prednisone  Maintained on Symbicort, singular, albuterol as needed.  Has daily symptoms with dyspnea with activity, nighttime awakenings.  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  Exposures: Reports remote asbestos exposure.  No mold, hot tub, Jacuzzi. Smoking history: Never smoker Travel history: Lived in [place] all her life.  Vacation travel to [place], [place].  ACQ score 08/06/18- 2.5  Interim history: Symbicort dose increased to 160 at last visit. She continues on Singulair States that the breathing is doing the best it has ever been. No exacerbations since prior visit Hardly needs to use her rescue inhaler.  She is being evaluated by orthopedics for right total knee replacement.  Outpatient Encounter Medications as of 11/03/2019  Medication Sig  . albuterol (PROAIR HFA) 108 (90 Base) MCG/ACT inhaler Inhale 2 puffs into the lungs as needed (Shortness of breath).  Marland Kitchen albuterol (PROVENTIL) (2.5 MG/3ML) 0.083% nebulizer solution Take 2.5 mg by nebulization every 4 (four) hours as needed for wheezing or shortness of breath.  . ARIPiprazole (ABILIFY) 10 MG tablet Take 5 mg by mouth at bedtime.   . budesonide-formoterol  (SYMBICORT) 160-4.5 MCG/ACT inhaler Inhale 2 puffs into the lungs 2 (two) times daily.  . Calcium Carbonate-Vitamin D (CALTRATE 600+D PO) Take 500 mg by mouth 2 (two) times daily.   . cetirizine (ZYRTEC) 10 MG tablet Take 10 mg by mouth daily.  Marland Kitchen levothyroxine (SYNTHROID, LEVOTHROID) 100 MCG tablet Take 100 mcg by mouth daily before breakfast.  . losartan (COZAAR) 50 MG tablet Take 50 mg by mouth daily.  . montelukast (SINGULAIR) 10 MG tablet Take 10 mg by mouth at bedtime.  Marland Kitchen PARoxetine (PAXIL) 20 MG tablet Take 20 mg by mouth at bedtime.    No facility-administered encounter medications on file as of 11/03/2019.   Physical Exam: Blood pressure 126/78, pulse 68, temperature 97.8 F (36.6 C), temperature source Temporal, height 5\' 3"  (1.6 m), weight 195 lb 9.6 oz (88.7 kg), SpO2 97 %. Gen:      No acute distress HEENT:  EOMI, sclera anicteric Neck:     No masses; no thyromegaly Lungs:    Clear to auscultation bilaterally; normal respiratory effort CV:         Regular rate and rhythm; no murmurs Abd:      + bowel sounds; soft, non-tender; no palpable masses, no distension Ext:    No edema; adequate peripheral perfusion Skin:      Warm and dry; no rash Neuro: alert and oriented x 3 Psych: normal mood and affect  Data Reviewed: Imaging CT angio 07/27/2017- no pulmonary embolism, bronchial wall thickening. CT sinus 07/02/2018-mild mucosal thickening in the paranasal sinus. I have reviewed the images personally.  PFTs 04/12/18 FVC 2.73 [93%), FEV1 2.02 [  91%), F/F 74, TLC 94%, DLCO 80% Minimal obstruction with bronchodilator response.  FENO 02/08/2018-17 FENO 04/12/2018-90 FENO 08/06/2018-13  Labs CBC 01/19/2018-WBC 7.7, eos 2.1%, absolute eosinophil count 162 CBC 06/30/2018-WBC 88, eos 2.5%, absolute eosinophil count 220 Blood allergy profile 01/19/2018-IgE 24, sensitive to dust mite.  Assessment:  Severe persistent asthma Continue Symbicort 160/4.5, Singulair We had considered  Biologics in the past but will hold off for now as her symptoms have improved  Preop clearance for total knee replacement She has been optimized from a pulmonary standpoint. Cleared for surgery  Allergic rhinitis Continue immunotherapy, Singulair  GERD Protonix  Plan/Recommendations: - Continue Symbicort, singular - Cleared for knee surgery. - Continue Protonix.  Marshell Garfinkel MD [facility] 11/03/2019, 11:19 AM  CC: [name], MD

## 2019-11-15 ENCOUNTER — Encounter (HOSPITAL_COMMUNITY)
Admission: RE | Admit: 2019-11-15 | Discharge: 2019-11-15 | Disposition: A | Discharge status: Home / Self Care | Payer: Medicare Other | Source: Ambulatory Visit | Attending: Orthopedic Surgery | Admitting: Orthopedic Surgery

## 2019-11-15 ENCOUNTER — Other Ambulatory Visit (HOSPITAL_COMMUNITY): Payer: Medicare Other

## 2019-11-15 ENCOUNTER — Other Ambulatory Visit: Payer: Self-pay

## 2019-11-15 DIAGNOSIS — I10 Essential (primary) hypertension: Secondary | ICD-10-CM | POA: Diagnosis not present

## 2019-11-15 DIAGNOSIS — Z01818 Encounter for other preprocedural examination: Secondary | ICD-10-CM | POA: Diagnosis not present

## 2019-11-15 NOTE — Progress Notes (Signed)
PCP -Yaakov Guthrie, MD  Cardiologist -  Pulmonary-Dr. Vaughan Browner with surgical clearance dated 11-03-19   Chest x-ray -  EKG -  Stress Test -  ECHO -  Cardiac Cath -   Sleep Study -  CPAP -   Fasting Blood Sugar -  Checks Blood Sugar _____ times a day  Blood Thinner Instructions: Aspirin Instructions: Last Dose:  Anesthesia review:   Patient denies shortness of breath, fever, cough and chest pain at PAT appointment   Patient verbalized understanding of instructions that were given to them at the PAT appointment. Patient was also instructed that they will need to review over the PAT instructions again at home before surgery.

## 2019-11-15 NOTE — Patient Instructions (Signed)
DUE TO COVID-19 ONLY ONE VISITOR IS ALLOWED TO COME WITH YOU AND STAY IN THE WAITING ROOM ONLY DURING PRE OP AND PROCEDURE DAY OF SURGERY. THE 1 VISITOR MAY VISIT WITH YOU AFTER SURGERY IN YOUR PRIVATE ROOM DURING VISITING HOURS ONLY!  YOU NEED TO HAVE A COVID 19 TEST ON_______ @_______ , THIS TEST MUST BE DONE BEFORE SURGERY, COME  [place] , [id number].  ([place]) ONCE YOUR COVID TEST IS COMPLETED, PLEASE BEGIN THE QUARANTINE INSTRUCTIONS AS OUTLINED IN YOUR HANDOUT.                Becky Davis  11/15/2019   Your procedure is scheduled on:    Report to [facility] Main  Entrance   Report to admitting at AM     Call this number if you have problems the morning of surgery [phone]    Remember: Do not eat food or drink liquids :After Midnight. BRUSH YOUR TEETH MORNING OF SURGERY AND RINSE YOUR MOUTH OUT, NO CHEWING GUM [place] OR MINTS.     Take these medicines the morning of surgery with A SIP OF WATER:   DO NOT TAKE ANY DIABETIC MEDICATIONS DAY OF YOUR SURGERY                               You may not have any metal on your body including hair pins and              piercings  Do not wear jewelry, make-up, lotions, powders or perfumes, deodorant             Do not wear nail polish on your fingernails.  Do not shave  48 hours prior to surgery.              Men may shave face and neck.   Do not bring valuables to the hospital. [place].  Contacts, dentures or bridgework may not be worn into surgery.  Leave suitcase in the car. After surgery it may be brought to your room.     Patients discharged the day of surgery will not be allowed to drive home. IF YOU ARE HAVING SURGERY AND GOING HOME THE SAME DAY, YOU MUST HAVE AN ADULT TO DRIVE YOU HOME AND BE WITH YOU FOR 24 HOURS. YOU MAY GO HOME BY TAXI OR UBER OR ORTHERWISE, BUT AN ADULT MUST ACCOMPANY YOU HOME AND STAY WITH YOU FOR 24  HOURS.  Name and phone number of your driver:  Special Instructions: N/A              Please read over the following fact sheets you were given: _____________________________________________________________________             [facility] - Preparing for Surgery Before surgery, you can play an important role.  Because skin is not sterile, your skin needs to be as free of germs as possible.  You can reduce the number of germs on your skin by washing with CHG (chlorahexidine gluconate) soap before surgery.  CHG is an antiseptic cleaner which kills germs and bonds with the skin to continue killing germs even after washing. Please DO NOT use if you have an allergy to CHG or antibacterial soaps.  If your skin becomes reddened/irritated stop using the CHG and inform your  nurse when you arrive at Short Stay. Do not shave (including legs and underarms) for at least 48 hours prior to the first CHG shower.  You may shave your face/neck. Please follow these instructions carefully:  1.  Shower with CHG Soap the night before surgery and the  morning of Surgery.  2.  If you choose to wash your hair, wash your hair first as usual with your  normal  shampoo.  3.  After you shampoo, rinse your hair and body thoroughly to remove the  shampoo.                           4.  Use CHG as you would any other liquid soap.  You can apply chg directly  to the skin and wash                       Gently with a scrungie or clean washcloth.  5.  Apply the CHG Soap to your body ONLY FROM THE NECK DOWN.   Do not use on face/ open                           Wound or open sores. Avoid contact with eyes, ears mouth and genitals (private parts).                       Wash face,  Genitals (private parts) with your normal soap.             6.  Wash thoroughly, paying special attention to the area where your surgery  will be performed.  7.  Thoroughly rinse your body with warm water from the neck down.  8.  DO NOT shower/wash with  your normal soap after using and rinsing off  the CHG Soap.                9.  Pat yourself dry with a clean towel.            10.  Wear clean pajamas.            11.  Place clean sheets on your bed the night of your first shower and do not  sleep with pets. Day of Surgery : Do not apply any lotions/deodorants the morning of surgery.  Please wear clean clothes to the hospital/surgery center.  FAILURE TO FOLLOW THESE INSTRUCTIONS MAY RESULT IN THE CANCELLATION OF YOUR SURGERY PATIENT SIGNATURE_________________________________  NURSE SIGNATURE__________________________________  ________________________________________________________________________   Becky Davis  An incentive spirometer is a tool that can help keep your lungs clear and active. This tool measures how well you are filling your lungs with each breath. Taking long deep breaths may help reverse or decrease the chance of developing breathing (pulmonary) problems (especially infection) following:  A long period of time when you are unable to move or be active. BEFORE THE PROCEDURE   If the spirometer includes an indicator to show your best effort, your nurse or respiratory therapist will set it to a desired goal.  If possible, sit up straight or lean slightly forward. Try not to slouch.  Hold the incentive spirometer in an upright position. INSTRUCTIONS FOR USE  1. Sit on the edge of your bed if possible, or sit up as far as you can in bed or on a chair. 2. Hold the incentive spirometer in an upright position. 3. Breathe  out normally. 4. Place the mouthpiece in your mouth and seal your lips tightly around it. 5. Breathe in slowly and as deeply as possible, raising the piston or the ball toward the top of the column. 6. Hold your breath for 3-5 seconds or for as long as possible. Allow the piston or ball to fall to the bottom of the column. 7. Remove the mouthpiece from your mouth and breathe out normally. 8. Rest  for a few seconds and repeat Steps 1 through 7 at least 10 times every 1-2 hours when you are awake. Take your time and take a few normal breaths between deep breaths. 9. The spirometer may include an indicator to show your best effort. Use the indicator as a goal to work toward during each repetition. 10. After each set of 10 deep breaths, practice coughing to be sure your lungs are clear. If you have an incision (the cut made at the time of surgery), support your incision when coughing by placing a pillow or rolled up towels firmly against it. Once you are able to get out of bed, walk around indoors and cough well. You may stop using the incentive spirometer when instructed by your caregiver.  RISKS AND COMPLICATIONS  Take your time so you do not get dizzy or light-headed.  If you are in pain, you may need to take or ask for pain medication before doing incentive spirometry. It is harder to take a deep breath if you are having pain. AFTER USE  Rest and breathe slowly and easily.  It can be helpful to keep track of a log of your progress. Your caregiver can provide you with a simple table to help with this. If you are using the spirometer at home, follow these instructions: [place] IF:   You are having difficultly using the spirometer.  You have trouble using the spirometer as often as instructed.  Your pain medication is not giving enough relief while using the spirometer.  You develop fever of 100.5 F (38.1 C) or higher. SEEK IMMEDIATE MEDICAL CARE IF:   You cough up bloody sputum that had not been present before.  You develop fever of 102 F (38.9 C) or greater.  You develop worsening pain at or near the incision site. MAKE SURE YOU:   Understand these instructions.  Will watch your condition.  Will get help right away if you are not doing well or get worse. Document Released: 11/10/2006 Document Revised: 09/22/2011 Document Reviewed: 01/11/2007 [facility]  Patient Information 2014 [place], [place].   ________________________________________________________________________

## 2019-11-22 DIAGNOSIS — J301 Allergic rhinitis due to pollen: Secondary | ICD-10-CM | POA: Diagnosis not present

## 2019-11-22 DIAGNOSIS — J3089 Other allergic rhinitis: Secondary | ICD-10-CM | POA: Diagnosis not present

## 2019-11-25 ENCOUNTER — Encounter: Payer: Medicare Other | Attending: Family Medicine | Admitting: Registered"

## 2019-11-25 ENCOUNTER — Encounter: Payer: Self-pay | Admitting: Registered"

## 2019-11-25 DIAGNOSIS — R739 Hyperglycemia, unspecified: Secondary | ICD-10-CM | POA: Diagnosis not present

## 2019-11-25 DIAGNOSIS — I10 Essential (primary) hypertension: Secondary | ICD-10-CM | POA: Diagnosis not present

## 2019-11-25 DIAGNOSIS — E669 Obesity, unspecified: Secondary | ICD-10-CM | POA: Diagnosis not present

## 2019-11-25 DIAGNOSIS — M1711 Unilateral primary osteoarthritis, right knee: Secondary | ICD-10-CM | POA: Diagnosis not present

## 2019-11-25 DIAGNOSIS — E039 Hypothyroidism, unspecified: Secondary | ICD-10-CM | POA: Diagnosis not present

## 2019-11-25 DIAGNOSIS — G4730 Sleep apnea, unspecified: Secondary | ICD-10-CM | POA: Diagnosis not present

## 2019-11-25 DIAGNOSIS — R7303 Prediabetes: Secondary | ICD-10-CM | POA: Insufficient documentation

## 2019-11-25 DIAGNOSIS — E782 Mixed hyperlipidemia: Secondary | ICD-10-CM | POA: Diagnosis not present

## 2019-11-25 DIAGNOSIS — Z01818 Encounter for other preprocedural examination: Secondary | ICD-10-CM | POA: Diagnosis not present

## 2019-11-25 DIAGNOSIS — M1990 Unspecified osteoarthritis, unspecified site: Secondary | ICD-10-CM | POA: Diagnosis not present

## 2019-11-25 DIAGNOSIS — J45909 Unspecified asthma, uncomplicated: Secondary | ICD-10-CM | POA: Diagnosis not present

## 2019-11-25 NOTE — Progress Notes (Signed)
On 11/25/19 pt completed the Diabetes Prevention Program course virtually with [employer]. By the end of this session patients are able to complete the following objectives:   Learning Objectives:  Reflect on lifestyle changes they have made since starting the DPP.   Set long-term goals to promote continued maintenance of lifestyle changes made during the program.   Goals:   Work toward reaching new long-term goals set during class.   Follow-Up Plan:  Contact Lifestyle [facility] with questions/concerns PRN.

## 2019-12-01 DIAGNOSIS — E039 Hypothyroidism, unspecified: Secondary | ICD-10-CM | POA: Diagnosis not present

## 2019-12-01 DIAGNOSIS — E209 Hypoparathyroidism, unspecified: Secondary | ICD-10-CM | POA: Diagnosis not present

## 2019-12-05 DIAGNOSIS — J301 Allergic rhinitis due to pollen: Secondary | ICD-10-CM | POA: Diagnosis not present

## 2019-12-05 DIAGNOSIS — J3089 Other allergic rhinitis: Secondary | ICD-10-CM | POA: Diagnosis not present

## 2019-12-09 NOTE — Patient Instructions (Addendum)
DUE TO COVID-19 ONLY ONE VISITOR IS ALLOWED TO COME WITH YOU AND STAY IN THE WAITING ROOM ONLY DURING PRE OP AND PROCEDURE DAY OF SURGERY. THE 2 VISITORS  MAY VISIT WITH YOU AFTER SURGERY IN YOUR PRIVATE ROOM DURING VISITING HOURS ONLY!  YOU NEED TO HAVE A COVID 19 TEST ON__6/4_____ @_10 :15______, THIS TEST MUST BE DONE BEFORE SURGERY, COME  [address], [place]  , [id number].  ([place]) ONCE YOUR COVID TEST IS COMPLETED, PLEASE BEGIN THE QUARANTINE INSTRUCTIONS AS OUTLINED IN YOUR HANDOUT.                Becky Davis    Your procedure is scheduled on: 12/20/19   Report to [facility] Main  Entrance   Report to Short Stay at 5:30 AM     Call this number if you have problems the morning of surgery [place], NO CHEWING GUM [place].   Do not eat food After Midnight.   YOU MAY HAVE CLEAR LIQUIDS FROM MIDNIGHT UNTIL 4:30 AM.   At 4:30 AM Please finish the prescribed Pre-Surgery Gatorade drink  . Nothing by mouth after you finish the Gatorade drink !    Take these medicines the morning of surgery with A SIP OF WATER: Paxil,  Zyrtec, Levothyroxine,                use your inhalers and bring them to the hospital                Bring your mask and tubing to the hospital with you.                                 You may not have any metal on your body including hair pins and              piercings  Do not wear jewelry, make-up, lotions, powders or perfumes, deodorant             Do not wear nail polish on your fingernails.             Do not shave  48 hours prior to surgery.              Do not bring valuables to the hospital. [place].  Contacts, dentures or bridgework may not be worn into surgery.                  Please read over the following fact sheets you were  given: _____________________________________________________________________             [facility] - Preparing for Surgery Before surgery, you can play an important role.   Because skin is not sterile, your skin needs to be as free of germs as possible .  You can reduce the number of germs on your skin by washing with CHG (chlorahexidine gluconate) soap before surgery .  CHG is an antiseptic cleaner which kills germs and bonds with the skin to continue killing germs even after washing. Please DO NOT use if you have an allergy to CHG or antibacterial soaps.   If your skin becomes reddened/irritated stop using the CHG and inform your nurse when you arrive at Short  Stay. Do not shave (including legs and underarms) for at least 48 hours prior to the first CHG shower.   Please follow these instructions carefully:  1.  Shower with CHG Soap the night before surgery and the  morning of Surgery.  2.  If you choose to wash your hair, wash your hair first as usual with your  normal  shampoo.  3.  After you shampoo, rinse your hair and body thoroughly to remove the  shampoo.                                        4.  Use CHG as you would any other liquid soap.  You can apply chg directly  to the skin and wash                       Gently with a scrungie or clean washcloth.  5.  Apply the CHG Soap to your body ONLY FROM THE NECK DOWN.   Do not use on face/ open                           Wound or open sores. Avoid contact with eyes, ears mouth and genitals (private parts).                       Wash face,  Genitals (private parts) with your normal soap.             6.  Wash thoroughly, paying special attention to the area where your surgery  will be performed.  7.  Thoroughly rinse your body with warm water from the neck down.  8.  DO NOT shower/wash with your normal soap after using and rinsing off  the CHG Soap.             9.  Pat yourself dry with a clean towel.            10.  Wear clean  pajamas.            11.  Place clean sheets on your bed the night of your first shower and do not  sleep with pets. Day of Surgery : Do not apply any lotions/deodorants the morning of surgery.  Please wear clean clothes to the hospital/surgery center.  FAILURE TO FOLLOW THESE INSTRUCTIONS MAY RESULT IN THE CANCELLATION OF YOUR SURGERY PATIENT SIGNATURE_________________________________  NURSE SIGNATURE__________________________________  ________________________________________________________________________   Becky Davis  An incentive spirometer is a tool that can help keep your lungs clear and active. This tool measures how well you are filling your lungs with each breath. Taking long deep breaths may help reverse or decrease the chance of developing breathing (pulmonary) problems (especially infection) following:  A long period of time when you are unable to move or be active. BEFORE THE PROCEDURE   If the spirometer includes an indicator to show your best effort, your nurse or respiratory therapist will set it to a desired goal.  If possible, sit up straight or lean slightly forward. Try not to slouch.  Hold the incentive spirometer in an upright position. INSTRUCTIONS FOR USE  1. Sit on the edge of your bed if possible, or sit up as far as you can in bed or on a chair. 2. Hold the incentive spirometer in an upright position. 3. Breathe  out normally. 4. Place the mouthpiece in your mouth and seal your lips tightly around it. 5. Breathe in slowly and as deeply as possible, raising the piston or the ball toward the top of the column. 6. Hold your breath for 3-5 seconds or for as long as possible. Allow the piston or ball to fall to the bottom of the column. 7. Remove the mouthpiece from your mouth and breathe out normally. 8. Rest for a few seconds and repeat Steps 1 through 7 at least 10 times every 1-2 hours when you are awake. Take your time and take a few normal breaths  between deep breaths. 9. The spirometer may include an indicator to show your best effort. Use the indicator as a goal to work toward during each repetition. 10. After each set of 10 deep breaths, practice coughing to be sure your lungs are clear. If you have an incision (the cut made at the time of surgery), support your incision when coughing by placing a pillow or rolled up towels firmly against it. Once you are able to get out of bed, walk around indoors and cough well. You may stop using the incentive spirometer when instructed by your caregiver.  RISKS AND COMPLICATIONS  Take your time so you do not get dizzy or light-headed.  If you are in pain, you may need to take or ask for pain medication before doing incentive spirometry. It is harder to take a deep breath if you are having pain. AFTER USE  Rest and breathe slowly and easily.  It can be helpful to keep track of a log of your progress. Your caregiver can provide you with a simple table to help with this. If you are using the spirometer at home, follow these instructions: [place] IF:   You are having difficultly using the spirometer.  You have trouble using the spirometer as often as instructed.  Your pain medication is not giving enough relief while using the spirometer.  You develop fever of 100.5 F (38.1 C) or higher. SEEK IMMEDIATE MEDICAL CARE IF:   You cough up bloody sputum that had not been present before.  You develop fever of 102 F (38.9 C) or greater.  You develop worsening pain at or near the incision site. MAKE SURE YOU:   Understand these instructions.  Will watch your condition.  Will get help right away if you are not doing well or get worse. Document Released: 11/10/2006 Document Revised: 09/22/2011 Document Reviewed: 01/11/2007 [facility] Patient Information 2014 [place], [place].   ________________________________________________________________________

## 2019-12-13 ENCOUNTER — Other Ambulatory Visit: Payer: Self-pay

## 2019-12-13 ENCOUNTER — Encounter (HOSPITAL_COMMUNITY): Payer: Self-pay

## 2019-12-13 ENCOUNTER — Encounter (HOSPITAL_COMMUNITY)
Admission: RE | Admit: 2019-12-13 | Discharge: 2019-12-13 | Disposition: A | Discharge status: Home / Self Care | Payer: Medicare Other | Source: Ambulatory Visit | Attending: Orthopedic Surgery | Admitting: Orthopedic Surgery

## 2019-12-13 DIAGNOSIS — R7303 Prediabetes: Secondary | ICD-10-CM | POA: Insufficient documentation

## 2019-12-13 DIAGNOSIS — Z01812 Encounter for preprocedural laboratory examination: Secondary | ICD-10-CM | POA: Insufficient documentation

## 2019-12-13 DIAGNOSIS — E039 Hypothyroidism, unspecified: Secondary | ICD-10-CM | POA: Insufficient documentation

## 2019-12-13 DIAGNOSIS — Z79899 Other long term (current) drug therapy: Secondary | ICD-10-CM | POA: Insufficient documentation

## 2019-12-13 DIAGNOSIS — M1711 Unilateral primary osteoarthritis, right knee: Secondary | ICD-10-CM | POA: Insufficient documentation

## 2019-12-13 DIAGNOSIS — I10 Essential (primary) hypertension: Secondary | ICD-10-CM | POA: Diagnosis not present

## 2019-12-13 DIAGNOSIS — G4733 Obstructive sleep apnea (adult) (pediatric): Secondary | ICD-10-CM | POA: Diagnosis not present

## 2019-12-13 DIAGNOSIS — Z7901 Long term (current) use of anticoagulants: Secondary | ICD-10-CM | POA: Diagnosis not present

## 2019-12-13 DIAGNOSIS — J45909 Unspecified asthma, uncomplicated: Secondary | ICD-10-CM | POA: Diagnosis not present

## 2019-12-13 LAB — CBC
HCT: 36.8 % (ref 36.0–46.0)
Hemoglobin: 12.4 g/dL (ref 12.0–15.0)
MCH: 29.9 pg (ref 26.0–34.0)
MCHC: 33.7 g/dL (ref 30.0–36.0)
MCV: 88.7 fL (ref 80.0–100.0)
Platelets: 267 10*3/uL (ref 150–400)
RBC: 4.15 MIL/uL (ref 3.87–5.11)
RDW: 12.7 % (ref 11.5–15.5)
WBC: 7.4 10*3/uL (ref 4.0–10.5)
nRBC: 0 % (ref 0.0–0.2)

## 2019-12-13 LAB — BASIC METABOLIC PANEL
Anion gap: 9 (ref 5–15)
BUN: 16 mg/dL (ref 8–23)
CO2: 25 mmol/L (ref 22–32)
Calcium: 8.8 mg/dL — ABNORMAL LOW (ref 8.9–10.3)
Chloride: 105 mmol/L (ref 98–111)
Creatinine, Ser: 0.68 mg/dL (ref 0.44–1.00)
GFR calc Af Amer: >60 mL/min (ref >60)
GFR calc non Af Amer: >60 mL/min (ref >60)
Glucose, Bld: 94 mg/dL (ref 70–99)
Potassium: 3.8 mmol/L (ref 3.5–5.1)
Sodium: 139 mmol/L (ref 135–145)

## 2019-12-13 LAB — HEMOGLOBIN A1C
Hgb A1c MFr Bld: 5.8 % — ABNORMAL HIGH (ref 4.8–5.6)
Mean Plasma Glucose: 119.76 mg/dL

## 2019-12-13 LAB — SURGICAL PCR SCREEN
MRSA, PCR: NEGATIVE
Staphylococcus aureus: NEGATIVE

## 2019-12-13 NOTE — Progress Notes (Signed)
COVID Vaccine Completed:Yes Date COVID Vaccine completed:08/26/19 COVID vaccine manufacturer: [place]     PCP - Dr. Merita Norton Cardiologist - Dr. Virgina Jock  Chest x-ray - 09/28/19 EKG - 11/15/19 Stress Test - no ECHO - 09/20/19- Pt states that she had a card. work up after 2 syncopal episode in 2020.  Results WNL . Due to dehydration. Cardiac Cath - no  Sleep Study - yes CPAP - yes  Fasting Blood Sugar - NA Checks Blood Sugar _____ times a day  Blood Thinner Instructions:NA Aspirin Instructions: Last Dose:  Anesthesia review:   Patient denies shortness of breath, fever, cough and chest pain at PAT appointment yes  Patient verbalized understanding of instructions that were given to them at the PAT appointment. Patient was also instructed that they will need to review over the PAT instructions again at home before surgery. Yes

## 2019-12-14 DIAGNOSIS — J3089 Other allergic rhinitis: Secondary | ICD-10-CM | POA: Diagnosis not present

## 2019-12-14 DIAGNOSIS — J301 Allergic rhinitis due to pollen: Secondary | ICD-10-CM | POA: Diagnosis not present

## 2019-12-14 NOTE — Care Plan (Signed)
Ortho Bundle Case Management Note  Patient Details  Name: Becky Davis MRN: [id number] Date of Birth: 05-Oct-1950 Spoke with patient prior to surgery. She will discharge to home with family and hired caregivers.  Rolling walker and 3n1 ordered for home use. HHPT referral to [facility] up at [facility].  Patient and MD in agreement with plan. Choice offered                     DME Arranged:  Walker rolling, Bedside commode DME Agency:  [employer]  HH Arranged:  PT [place] Agency:  [facility] ([facility])  Additional Comments: Please contact me with any questions of if this plan should need to change.  Ladell Heads,  [place] [facility] Specialist  [phone] 12/14/2019, 10:14 AM

## 2019-12-16 ENCOUNTER — Other Ambulatory Visit (HOSPITAL_COMMUNITY)
Admission: RE | Admit: 2019-12-16 | Discharge: 2019-12-16 | Disposition: A | Discharge status: Home / Self Care | Payer: Medicare Other | Source: Ambulatory Visit | Attending: Orthopedic Surgery | Admitting: Orthopedic Surgery

## 2019-12-16 DIAGNOSIS — Z01812 Encounter for preprocedural laboratory examination: Secondary | ICD-10-CM | POA: Diagnosis not present

## 2019-12-16 DIAGNOSIS — Z20822 Contact with and (suspected) exposure to covid-19: Secondary | ICD-10-CM | POA: Diagnosis not present

## 2019-12-16 NOTE — Progress Notes (Signed)
Anesthesia Chart Review   Case: [id number] Date/Time: 12/20/19 0715   Procedure: TOTAL KNEE ARTHROPLASTY (Right Knee)   Anesthesia type: Choice   Pre-op diagnosis: djd right knee   Location: [place] / [facility] ORS   Surgeons: Marchia Bond, MD      DISCUSSION:69 y.o. never smoker with h/o asthma, HTN, s/p thyroidectomy, pre-diabetes, asthma, OSA on CPAP, right knee djd scheduled for above procedure 12/20/2019 with Dr. Marchia Bond.   Severe persistent asthma followed by pulmonology.  Last seen 11/03/2019.  Per OV note, "She has been optimized from a pulmonary standpoint.  Cleared for surgery."  Anticipate pt can proceed with planned procedure barring acute status change.   VS: BP (!) 148/82   Pulse 61   Temp 37 C (Oral)   Resp 18   Ht 5\' 3"  (1.6 m)   Wt 88.1 kg   SpO2 97%   BMI 34.40 kg/m   PROVIDERS: [name], MD is PCP   Vernell Leep, MD is Cardiologist  LABS: Labs reviewed: Acceptable for surgery. (all labs ordered are listed, but only abnormal results are displayed)  Labs Reviewed  BASIC METABOLIC PANEL - Abnormal; Notable for the following components:      Result Value   Calcium 8.8 (*)    All other components within normal limits  HEMOGLOBIN A1C - Abnormal; Notable for the following components:   Hgb A1c MFr Bld 5.8 (*)    All other components within normal limits  SURGICAL PCR SCREEN  CBC     IMAGES:   EKG: 11/15/2019 Rate 70 bpm Normal sinus rhythm Cannot rule out Anterior infarct , age undetermined Abnormal ECG No significant change since last tracing  CV: Echocardiogram 09/20/2018:  Left ventricle cavity is normal in size. Normal left ventricular shape.  Normal global wall motion. Doppler evidence of grade I (impaired)  diastolic dysfunction, normal LAP. Calculated EF 69%.  Left atrial cavity is moderately dilated.  Mild (Grade I) aortic regurgitation.  Mild (Grade I) mitral regurgitation.  Mild tricuspid regurgitation. Estimated  pulmonary artery systolic pressure  35 mmHg.  Past Medical History:  Diagnosis Date  . Arthritis    knees  . Asthma   . Headache    hx of 20 years ago  . Heart murmur    hx of 10-15 years ago  . Hypertension   . Hypothyroidism   . Pneumonia   . Pre-diabetes   . Thyroid nodule     Past Surgical History:  Procedure Laterality Date  . ABDOMINAL HYSTERECTOMY    . thyroid goiter     surgery  . THYROID LOBECTOMY     06-25-17 Dr. Harlow Asa   Left  . THYROID LOBECTOMY Left 06/25/2017   Procedure: LEFT THYROID LOBECTOMY;  Surgeon: Armandina Gemma, MD;  Location: [facility] ORS;  Service: General;  Laterality: Left;    MEDICATIONS: . albuterol (PROAIR HFA) 108 (90 Base) MCG/ACT inhaler  . albuterol (PROVENTIL) (2.5 MG/3ML) 0.083% nebulizer solution  . ARIPiprazole (ABILIFY) 5 MG tablet  . budesonide-formoterol (SYMBICORT) 160-4.5 MCG/ACT inhaler  . calcitRIOL (ROCALTROL) 0.25 MCG capsule  . Calcium Carbonate-Vitamin D (CALTRATE 600+D PO)  . cetirizine (ZYRTEC) 10 MG tablet  . ibuprofen (ADVIL) 200 MG tablet  . levothyroxine (SYNTHROID, LEVOTHROID) 100 MCG tablet  . losartan (COZAAR) 100 MG tablet  . montelukast (SINGULAIR) 10 MG tablet  . PARoxetine (PAXIL) 20 MG tablet   No current facility-administered medications for this encounter.    Konrad Felix, PA-C [facility] Pre-Surgical Testing [phone]  12/16/19  11:50 AM

## 2019-12-17 LAB — SARS CORONAVIRUS 2 (TAT 6-24 HRS): SARS Coronavirus 2: NEGATIVE

## 2019-12-19 NOTE — Anesthesia Preprocedure Evaluation (Addendum)
Anesthesia Evaluation  Patient identified by MRN, date of birth, ID band Patient awake    Reviewed: Allergy & Precautions, NPO status , Patient's Chart, lab work & pertinent test results  History of Anesthesia Complications Negative for: history of anesthetic complications  Airway Mallampati: II  TM Distance: >3 FB Neck ROM: Full    Dental no notable dental hx. (+) Dental Advisory Given, Teeth Intact   Pulmonary asthma , sleep apnea , pneumonia, resolved, COPD,  COPD inhaler,    Pulmonary exam normal breath sounds clear to auscultation       Cardiovascular hypertension, Pt. on medications (-) anginaNormal cardiovascular exam+ Valvular Problems/Murmurs  Rhythm:Regular Rate:Normal     Neuro/Psych  Headaches, PSYCHIATRIC DISORDERS Depression    GI/Hepatic negative GI ROS, Neg liver ROS,   Endo/Other  Hypothyroidism Morbid obesity  Renal/GU negative Renal ROS     Musculoskeletal  (+) Arthritis ,   Abdominal (+) + obese,   Peds  Hematology   Anesthesia Other Findings   Reproductive/Obstetrics                            Anesthesia Physical  Anesthesia Plan  ASA: III  Anesthesia Plan: Spinal   Post-op Pain Management:  [place]ional for Post-op pain   Induction: Intravenous  PONV Risk Score and Plan: 3 and Ondansetron, Dexamethasone and Scopolamine patch - Pre-op  Airway Management Planned: Natural Airway  Additional Equipment: None  Intra-op Plan:   Post-operative Plan:   Informed Consent: I have reviewed the patients History and Physical, chart, labs and discussed the procedure including the risks, benefits and alternatives for the proposed anesthesia with the patient or authorized representative who has indicated his/her understanding and acceptance.     Dental advisory given  Plan Discussed with: CRNA  Anesthesia Plan Comments:        Anesthesia Quick Evaluation

## 2019-12-20 ENCOUNTER — Observation Stay (HOSPITAL_COMMUNITY)
Admission: RE | Admit: 2019-12-20 | Discharge: 2019-12-22 | Disposition: A | Discharge status: Home / Self Care | Payer: Medicare Other | Attending: Orthopedic Surgery | Admitting: Orthopedic Surgery

## 2019-12-20 ENCOUNTER — Other Ambulatory Visit: Payer: Self-pay

## 2019-12-20 ENCOUNTER — Encounter (HOSPITAL_COMMUNITY)
Admission: RE | Disposition: A | Discharge status: Home / Self Care | Payer: Self-pay | Source: Home / Self Care | Attending: Orthopedic Surgery

## 2019-12-20 ENCOUNTER — Encounter (HOSPITAL_COMMUNITY): Payer: Self-pay | Admitting: Orthopedic Surgery

## 2019-12-20 ENCOUNTER — Ambulatory Visit (HOSPITAL_COMMUNITY): Payer: Medicare Other | Admitting: Physician Assistant

## 2019-12-20 ENCOUNTER — Observation Stay (HOSPITAL_COMMUNITY): Payer: Medicare Other

## 2019-12-20 ENCOUNTER — Ambulatory Visit (HOSPITAL_COMMUNITY): Payer: Medicare Other | Admitting: Anesthesiology

## 2019-12-20 DIAGNOSIS — Z79890 Hormone replacement therapy (postmenopausal): Secondary | ICD-10-CM | POA: Diagnosis not present

## 2019-12-20 DIAGNOSIS — J449 Chronic obstructive pulmonary disease, unspecified: Secondary | ICD-10-CM | POA: Insufficient documentation

## 2019-12-20 DIAGNOSIS — I10 Essential (primary) hypertension: Secondary | ICD-10-CM | POA: Diagnosis not present

## 2019-12-20 DIAGNOSIS — Z79899 Other long term (current) drug therapy: Secondary | ICD-10-CM | POA: Diagnosis not present

## 2019-12-20 DIAGNOSIS — Z6834 Body mass index (BMI) 34.0-34.9, adult: Secondary | ICD-10-CM | POA: Insufficient documentation

## 2019-12-20 DIAGNOSIS — Z96651 Presence of right artificial knee joint: Secondary | ICD-10-CM

## 2019-12-20 DIAGNOSIS — M1711 Unilateral primary osteoarthritis, right knee: Principal | ICD-10-CM | POA: Insufficient documentation

## 2019-12-20 DIAGNOSIS — R7303 Prediabetes: Secondary | ICD-10-CM | POA: Insufficient documentation

## 2019-12-20 DIAGNOSIS — G4733 Obstructive sleep apnea (adult) (pediatric): Secondary | ICD-10-CM | POA: Diagnosis not present

## 2019-12-20 DIAGNOSIS — E039 Hypothyroidism, unspecified: Secondary | ICD-10-CM | POA: Diagnosis not present

## 2019-12-20 DIAGNOSIS — Z7951 Long term (current) use of inhaled steroids: Secondary | ICD-10-CM | POA: Insufficient documentation

## 2019-12-20 DIAGNOSIS — Z471 Aftercare following joint replacement surgery: Secondary | ICD-10-CM | POA: Diagnosis not present

## 2019-12-20 DIAGNOSIS — F329 Major depressive disorder, single episode, unspecified: Secondary | ICD-10-CM | POA: Insufficient documentation

## 2019-12-20 DIAGNOSIS — G8918 Other acute postprocedural pain: Secondary | ICD-10-CM | POA: Diagnosis not present

## 2019-12-20 DIAGNOSIS — J45909 Unspecified asthma, uncomplicated: Secondary | ICD-10-CM | POA: Diagnosis not present

## 2019-12-20 HISTORY — PX: TOTAL KNEE ARTHROPLASTY: SHX125

## 2019-12-20 SURGERY — ARTHROPLASTY, KNEE, TOTAL
Anesthesia: Spinal | Site: Knee | Laterality: Right

## 2019-12-20 MED ORDER — METOCLOPRAMIDE HCL 5 MG PO TABS
5.0000 mg | ORAL_TABLET | Freq: Three times a day (TID) | ORAL | Status: DC | PRN
  Filled 2019-12-20: qty 2

## 2019-12-20 MED ORDER — METOCLOPRAMIDE HCL 5 MG/ML IJ SOLN: 5.0000 mg | Freq: Three times a day (TID) | INTRAMUSCULAR | Status: DC | PRN

## 2019-12-20 MED ORDER — MEPERIDINE HCL 50 MG/ML IJ SOLN: 6.2500 mg | INTRAMUSCULAR | Status: DC | PRN

## 2019-12-20 MED ORDER — PAROXETINE HCL 20 MG PO TABS
20.0000 mg | ORAL_TABLET | Freq: Every day | ORAL | Status: DC
  Administered 2019-12-20 – 2019-12-21 (×2): 20 mg via ORAL
  Filled 2019-12-20 (×2): qty 1

## 2019-12-20 MED ORDER — ONDANSETRON HCL 4 MG PO TABS: 4.0000 mg | ORAL_TABLET | Freq: Three times a day (TID) | ORAL | 0 refills | Status: DC | PRN

## 2019-12-20 MED ORDER — BUDESONIDE-FORMOTEROL FUMARATE 160-4.5 MCG/ACT IN AERO
2.0000 | INHALATION_SPRAY | Freq: Two times a day (BID) | RESPIRATORY_TRACT | Status: DC
  Administered 2019-12-22: 2 via RESPIRATORY_TRACT

## 2019-12-20 MED ORDER — KETOROLAC TROMETHAMINE 15 MG/ML IJ SOLN
7.5000 mg | Freq: Four times a day (QID) | INTRAMUSCULAR | Status: AC
  Administered 2019-12-20 – 2019-12-21 (×3): 7.5 mg via INTRAVENOUS
  Filled 2019-12-20 (×3): qty 1

## 2019-12-20 MED ORDER — CEFAZOLIN SODIUM-DEXTROSE 2-4 GM/100ML-% IV SOLN
2.0000 g | INTRAVENOUS | Status: AC
  Administered 2019-12-20: 2 g via INTRAVENOUS
  Filled 2019-12-20: qty 100

## 2019-12-20 MED ORDER — BUPIVACAINE-EPINEPHRINE (PF) 0.5% -1:200000 IJ SOLN
INTRAMUSCULAR | Status: DC | PRN
  Administered 2019-12-20: 20 mL

## 2019-12-20 MED ORDER — BUPIVACAINE HCL 0.25 % IJ SOLN
INTRAMUSCULAR | Status: DC | PRN
  Administered 2019-12-20: 30 mL

## 2019-12-20 MED ORDER — PROPOFOL 10 MG/ML IV BOLUS
INTRAVENOUS | Status: DC | PRN
Start: 2019-12-20 — End: 2019-12-20
  Administered 2019-12-20: 20 mg via INTRAVENOUS
  Administered 2019-12-20: 10 mg via INTRAVENOUS
  Administered 2019-12-20: 20 mg via INTRAVENOUS
  Administered 2019-12-20: 10 mg via INTRAVENOUS
  Administered 2019-12-20: 20 mg via INTRAVENOUS

## 2019-12-20 MED ORDER — ONDANSETRON HCL 4 MG/2ML IJ SOLN
INTRAMUSCULAR | Status: AC
  Filled 2019-12-20: qty 2

## 2019-12-20 MED ORDER — POTASSIUM CHLORIDE IN NACL 20-0.45 MEQ/L-% IV SOLN
INTRAVENOUS | Status: DC
  Filled 2019-12-20 (×2): qty 1000

## 2019-12-20 MED ORDER — CEFAZOLIN SODIUM-DEXTROSE 2-4 GM/100ML-% IV SOLN
2.0000 g | Freq: Four times a day (QID) | INTRAVENOUS | Status: AC
  Administered 2019-12-20 (×2): 2 g via INTRAVENOUS
  Filled 2019-12-20: qty 100

## 2019-12-20 MED ORDER — DEXAMETHASONE SODIUM PHOSPHATE 10 MG/ML IJ SOLN
INTRAMUSCULAR | Status: AC
  Filled 2019-12-20: qty 1

## 2019-12-20 MED ORDER — FENTANYL CITRATE (PF) 100 MCG/2ML IJ SOLN
INTRAMUSCULAR | Status: DC | PRN
  Administered 2019-12-20 (×2): 50 ug via INTRAVENOUS

## 2019-12-20 MED ORDER — ACETAMINOPHEN 325 MG PO TABS: 325.0000 mg | ORAL_TABLET | Freq: Four times a day (QID) | ORAL | Status: DC | PRN

## 2019-12-20 MED ORDER — KETOROLAC TROMETHAMINE 30 MG/ML IJ SOLN
INTRAMUSCULAR | Status: AC
  Filled 2019-12-20: qty 1

## 2019-12-20 MED ORDER — FENTANYL CITRATE (PF) 100 MCG/2ML IJ SOLN
INTRAMUSCULAR | Status: AC
  Filled 2019-12-20: qty 2

## 2019-12-20 MED ORDER — BUPIVACAINE HCL (PF) 0.25 % IJ SOLN
INTRAMUSCULAR | Status: AC
  Filled 2019-12-20: qty 30

## 2019-12-20 MED ORDER — PROPOFOL 500 MG/50ML IV EMUL
INTRAVENOUS | Status: DC | PRN
Start: 2019-12-20 — End: 2019-12-20
  Administered 2019-12-20: 100 ug/kg/min via INTRAVENOUS

## 2019-12-20 MED ORDER — HYDROCODONE-ACETAMINOPHEN 5-325 MG PO TABS
1.0000 | ORAL_TABLET | ORAL | Status: DC | PRN
  Administered 2019-12-21 – 2019-12-22 (×3): 1 via ORAL
  Filled 2019-12-20 (×3): qty 1

## 2019-12-20 MED ORDER — ACETAMINOPHEN 500 MG PO TABS
1000.0000 mg | ORAL_TABLET | Freq: Once | ORAL | Status: AC
  Administered 2019-12-20: 1000 mg via ORAL
  Filled 2019-12-20: qty 2

## 2019-12-20 MED ORDER — DIPHENHYDRAMINE HCL 12.5 MG/5ML PO ELIX
12.5000 mg | ORAL_SOLUTION | ORAL | Status: DC | PRN
  Filled 2019-12-20: qty 10

## 2019-12-20 MED ORDER — MENTHOL 3 MG MT LOZG
1.0000 | LOZENGE | OROMUCOSAL | Status: DC | PRN
  Filled 2019-12-20: qty 9

## 2019-12-20 MED ORDER — ALBUTEROL SULFATE HFA 108 (90 BASE) MCG/ACT IN AERS: 2.0000 | INHALATION_SPRAY | RESPIRATORY_TRACT | Status: DC | PRN

## 2019-12-20 MED ORDER — MOMETASONE FURO-FORMOTEROL FUM 200-5 MCG/ACT IN AERO
2.0000 | INHALATION_SPRAY | Freq: Two times a day (BID) | RESPIRATORY_TRACT | Status: DC
  Filled 2019-12-20: qty 8.8

## 2019-12-20 MED ORDER — MIDAZOLAM HCL 2 MG/2ML IJ SOLN
INTRAMUSCULAR | Status: AC
  Filled 2019-12-20: qty 2

## 2019-12-20 MED ORDER — MAGNESIUM CITRATE PO SOLN
1.0000 | Freq: Once | ORAL | Status: DC | PRN
  Filled 2019-12-20: qty 296

## 2019-12-20 MED ORDER — MIDAZOLAM HCL 5 MG/5ML IJ SOLN
INTRAMUSCULAR | Status: DC | PRN
  Administered 2019-12-20: 2 mg via INTRAVENOUS

## 2019-12-20 MED ORDER — ONDANSETRON HCL 4 MG PO TABS
4.0000 mg | ORAL_TABLET | Freq: Four times a day (QID) | ORAL | Status: DC | PRN
  Filled 2019-12-20: qty 1

## 2019-12-20 MED ORDER — PHENYLEPHRINE HCL (PRESSORS) 10 MG/ML IV SOLN
INTRAVENOUS | Status: DC | PRN
  Administered 2019-12-20 (×3): 80 ug via INTRAVENOUS
  Administered 2019-12-20: 40 ug via INTRAVENOUS

## 2019-12-20 MED ORDER — PROMETHAZINE HCL 25 MG/ML IJ SOLN: 6.2500 mg | INTRAMUSCULAR | Status: DC | PRN

## 2019-12-20 MED ORDER — ALUM & MAG HYDROXIDE-SIMETH 200-200-20 MG/5ML PO SUSP
30.0000 mL | ORAL | Status: DC | PRN
  Filled 2019-12-20: qty 30

## 2019-12-20 MED ORDER — SENNA-DOCUSATE SODIUM 8.6-50 MG PO TABS
2.0000 | ORAL_TABLET | Freq: Every day | ORAL | 1 refills | Status: DC
Start: 2019-12-20 — End: 2023-10-22

## 2019-12-20 MED ORDER — PHENOL 1.4 % MT LIQD
1.0000 | OROMUCOSAL | Status: DC | PRN
  Filled 2019-12-20: qty 177

## 2019-12-20 MED ORDER — LEVOTHYROXINE SODIUM 100 MCG PO TABS
100.0000 ug | ORAL_TABLET | Freq: Every day | ORAL | Status: DC
  Administered 2019-12-21 – 2019-12-22 (×2): 100 ug via ORAL
  Filled 2019-12-20 (×3): qty 1

## 2019-12-20 MED ORDER — 0.9 % SODIUM CHLORIDE (POUR BTL) OPTIME
TOPICAL | Status: DC | PRN
  Administered 2019-12-20: 1000 mL

## 2019-12-20 MED ORDER — ACETAMINOPHEN 500 MG PO TABS
500.0000 mg | ORAL_TABLET | Freq: Four times a day (QID) | ORAL | Status: AC
  Administered 2019-12-20 – 2019-12-21 (×4): 500 mg via ORAL
  Filled 2019-12-20 (×4): qty 1

## 2019-12-20 MED ORDER — LOSARTAN POTASSIUM 50 MG PO TABS
100.0000 mg | ORAL_TABLET | Freq: Every day | ORAL | Status: DC
  Administered 2019-12-21: 100 mg via ORAL
  Filled 2019-12-20 (×2): qty 2

## 2019-12-20 MED ORDER — SODIUM CHLORIDE 0.9 % IR SOLN
Status: DC | PRN
  Administered 2019-12-20: 3000 mL

## 2019-12-20 MED ORDER — DEXAMETHASONE SODIUM PHOSPHATE 10 MG/ML IJ SOLN
INTRAMUSCULAR | Status: DC | PRN
  Administered 2019-12-20: 10 mg via INTRAVENOUS

## 2019-12-20 MED ORDER — DOCUSATE SODIUM 100 MG PO CAPS
100.0000 mg | ORAL_CAPSULE | Freq: Two times a day (BID) | ORAL | Status: DC
  Administered 2019-12-20 – 2019-12-22 (×5): 100 mg via ORAL
  Filled 2019-12-20 (×5): qty 1

## 2019-12-20 MED ORDER — ZOLPIDEM TARTRATE 5 MG PO TABS: 5.0000 mg | ORAL_TABLET | Freq: Every evening | ORAL | Status: DC | PRN

## 2019-12-20 MED ORDER — HYDROCODONE-ACETAMINOPHEN 7.5-325 MG PO TABS
1.0000 | ORAL_TABLET | ORAL | Status: DC | PRN
  Administered 2019-12-20 – 2019-12-21 (×3): 1 via ORAL
  Filled 2019-12-20: qty 1
  Filled 2019-12-20 (×2): qty 2

## 2019-12-20 MED ORDER — CEFAZOLIN SODIUM-DEXTROSE 2-4 GM/100ML-% IV SOLN
INTRAVENOUS | Status: AC
  Filled 2019-12-20: qty 100

## 2019-12-20 MED ORDER — MONTELUKAST SODIUM 10 MG PO TABS
10.0000 mg | ORAL_TABLET | Freq: Every day | ORAL | Status: DC
  Administered 2019-12-20 – 2019-12-21 (×2): 10 mg via ORAL
  Filled 2019-12-20 (×3): qty 1

## 2019-12-20 MED ORDER — BACLOFEN 10 MG PO TABS
10.0000 mg | ORAL_TABLET | Freq: Three times a day (TID) | ORAL | 0 refills | Status: DC
Start: 2019-12-20 — End: 2023-10-22

## 2019-12-20 MED ORDER — LACTATED RINGERS IV SOLN: INTRAVENOUS | Status: DC

## 2019-12-20 MED ORDER — MUPIROCIN 2 % EX OINT
TOPICAL_OINTMENT | CUTANEOUS | Status: AC
  Filled 2019-12-20: qty 22

## 2019-12-20 MED ORDER — MORPHINE SULFATE (PF) 4 MG/ML IV SOLN: 0.5000 mg | INTRAVENOUS | Status: DC | PRN

## 2019-12-20 MED ORDER — CALCITRIOL 0.25 MCG PO CAPS
0.2500 ug | ORAL_CAPSULE | Freq: Every day | ORAL | Status: DC
  Administered 2019-12-20 – 2019-12-22 (×3): 0.25 ug via ORAL
  Filled 2019-12-20 (×3): qty 1

## 2019-12-20 MED ORDER — CLONIDINE HCL (ANALGESIA) 100 MCG/ML EP SOLN
EPIDURAL | Status: DC | PRN
  Administered 2019-12-20: 70 ug

## 2019-12-20 MED ORDER — CHLORHEXIDINE GLUCONATE 0.12 % MT SOLN
15.0000 mL | Freq: Once | OROMUCOSAL | Status: AC
  Administered 2019-12-20: 15 mL via OROMUCOSAL

## 2019-12-20 MED ORDER — ASPIRIN EC 325 MG PO TBEC
325.0000 mg | DELAYED_RELEASE_TABLET | Freq: Every day | ORAL | Status: DC
  Administered 2019-12-21 – 2019-12-22 (×2): 325 mg via ORAL
  Filled 2019-12-20 (×3): qty 1

## 2019-12-20 MED ORDER — POVIDONE-IODINE 10 % EX SWAB
2.0000 "application " | Freq: Once | CUTANEOUS | Status: AC
  Administered 2019-12-20: 2 via TOPICAL

## 2019-12-20 MED ORDER — ONDANSETRON HCL 4 MG/2ML IJ SOLN
INTRAMUSCULAR | Status: DC | PRN
  Administered 2019-12-20: 4 mg via INTRAVENOUS

## 2019-12-20 MED ORDER — POLYETHYLENE GLYCOL 3350 17 G PO PACK
17.0000 g | PACK | Freq: Every day | ORAL | Status: DC | PRN
  Filled 2019-12-20: qty 1

## 2019-12-20 MED ORDER — DEXAMETHASONE SODIUM PHOSPHATE 4 MG/ML IJ SOLN
INTRAMUSCULAR | Status: DC | PRN
Start: 2019-12-20 — End: 2019-12-20
  Administered 2019-12-20: 5 mg via INTRAVENOUS

## 2019-12-20 MED ORDER — ASPIRIN EC 325 MG PO TBEC
325.0000 mg | DELAYED_RELEASE_TABLET | Freq: Two times a day (BID) | ORAL | 0 refills | Status: DC
Start: 2019-12-20 — End: 2021-08-06

## 2019-12-20 MED ORDER — BUPIVACAINE IN DEXTROSE 0.75-8.25 % IT SOLN
INTRATHECAL | Status: DC | PRN
  Administered 2019-12-20: 1.6 mL via INTRATHECAL

## 2019-12-20 MED ORDER — HYDROMORPHONE HCL 1 MG/ML IJ SOLN: 0.2500 mg | INTRAMUSCULAR | Status: DC | PRN

## 2019-12-20 MED ORDER — KETOROLAC TROMETHAMINE 30 MG/ML IJ SOLN
INTRAMUSCULAR | Status: DC | PRN
  Administered 2019-12-20: 30 mg

## 2019-12-20 MED ORDER — TRANEXAMIC ACID-NACL 1000-0.7 MG/100ML-% IV SOLN
1000.0000 mg | INTRAVENOUS | Status: AC
  Administered 2019-12-20: 1000 mg via INTRAVENOUS
  Filled 2019-12-20: qty 100

## 2019-12-20 MED ORDER — HYDROCODONE-ACETAMINOPHEN 10-325 MG PO TABS: 1.0000 | ORAL_TABLET | Freq: Four times a day (QID) | ORAL | 0 refills | Status: DC | PRN

## 2019-12-20 MED ORDER — ARIPIPRAZOLE 5 MG PO TABS
5.0000 mg | ORAL_TABLET | Freq: Every day | ORAL | Status: DC
  Administered 2019-12-20 – 2019-12-21 (×2): 5 mg via ORAL
  Filled 2019-12-20 (×2): qty 1

## 2019-12-20 MED ORDER — KETOROLAC TROMETHAMINE 15 MG/ML IJ SOLN
INTRAMUSCULAR | Status: AC
  Filled 2019-12-20: qty 1

## 2019-12-20 MED ORDER — ALBUTEROL SULFATE (2.5 MG/3ML) 0.083% IN NEBU: 2.5000 mg | INHALATION_SOLUTION | RESPIRATORY_TRACT | Status: DC | PRN

## 2019-12-20 MED ORDER — PROPOFOL 10 MG/ML IV BOLUS
INTRAVENOUS | Status: AC
  Filled 2019-12-20: qty 20

## 2019-12-20 MED ORDER — LORATADINE 10 MG PO TABS
10.0000 mg | ORAL_TABLET | Freq: Every day | ORAL | Status: DC
  Administered 2019-12-21: 10 mg via ORAL
  Filled 2019-12-20 (×3): qty 1

## 2019-12-20 MED ORDER — WATER FOR IRRIGATION, STERILE IR SOLN
Status: DC | PRN
  Administered 2019-12-20: 2000 mL

## 2019-12-20 MED ORDER — BISACODYL 10 MG RE SUPP
10.0000 mg | Freq: Every day | RECTAL | Status: DC | PRN
  Filled 2019-12-20: qty 1

## 2019-12-20 MED ORDER — ONDANSETRON HCL 4 MG/2ML IJ SOLN: 4.0000 mg | Freq: Four times a day (QID) | INTRAMUSCULAR | Status: DC | PRN

## 2019-12-20 SURGICAL SUPPLY — 60 items
BAG SPEC THK2 15X12 ZIP CLS (MISCELLANEOUS)
BAG ZIPLOCK 12X15 (MISCELLANEOUS) IMPLANT
BEARING TIBIA PS 63/67X10 KNEE (Miscellaneous) IMPLANT
BIT DRILL QUICK REL 1/8 2PK SL (DRILL) IMPLANT
BLADE SAG 18X100X1.27 (BLADE) ×4 IMPLANT
BLADE SURG 15 STRL LF DISP TIS (BLADE) ×1 IMPLANT
BLADE SURG 15 STRL SS (BLADE) ×2
BNDG CMPR MED 10X6 ELC LF (GAUZE/BANDAGES/DRESSINGS) ×1
BNDG ELASTIC 6X10 VLCR STRL LF (GAUZE/BANDAGES/DRESSINGS) ×2 IMPLANT
BOWL SMART MIX CTS (DISPOSABLE) ×2 IMPLANT
BRNG TIB 0D 63/67X10 POST (Miscellaneous) ×1 IMPLANT
CEMENT BONE R 1X40 (Cement) ×4 IMPLANT
CLSR STERI-STRIP ANTIMIC 1/2X4 (GAUZE/BANDAGES/DRESSINGS) ×4 IMPLANT
COMPONENT OPEN BOX FEM RT 60 (Miscellaneous) ×1 IMPLANT
COVER SURGICAL LIGHT HANDLE (MISCELLANEOUS) ×2 IMPLANT
COVER WAND RF STERILE (DRAPES) ×1 IMPLANT
CUFF TOURN SGL QUICK 34 (TOURNIQUET CUFF) ×2
CUFF TRNQT CYL 34X4.125X (TOURNIQUET CUFF) ×1 IMPLANT
DECANTER SPIKE VIAL GLASS SM (MISCELLANEOUS) IMPLANT
DISTAL FEMORAL PEG (Knees) ×2 IMPLANT
DRAPE U-SHAPE 47X51 STRL (DRAPES) ×2 IMPLANT
DRESSING AQUACEL AG SP 3.5X10 (GAUZE/BANDAGES/DRESSINGS) ×1 IMPLANT
DRILL QUICK RELEASE 1/8 INCH (DRILL) ×4
DRSG AQUACEL AG ADV 3.5X10 (GAUZE/BANDAGES/DRESSINGS) ×2 IMPLANT
DRSG AQUACEL AG SP 3.5X10 (GAUZE/BANDAGES/DRESSINGS) ×2
DRSG MEPILEX BORDER 4X8 (GAUZE/BANDAGES/DRESSINGS) ×2 IMPLANT
DRSG PAD ABDOMINAL 8X10 ST (GAUZE/BANDAGES/DRESSINGS) ×2 IMPLANT
DURAPREP 26ML APPLICATOR (WOUND CARE) ×4 IMPLANT
ELECT REM PT RETURN 15FT ADLT (MISCELLANEOUS) ×2 IMPLANT
GLOVE BIO SURGEON STRL SZ7 (GLOVE) ×2 IMPLANT
GLOVE BIOGEL PI IND STRL 7.0 (GLOVE) ×1 IMPLANT
GLOVE BIOGEL PI IND STRL 8 (GLOVE) ×1 IMPLANT
GLOVE BIOGEL PI INDICATOR 7.0 (GLOVE) ×1
GLOVE BIOGEL PI INDICATOR 8 (GLOVE) ×1
GLOVE SURG SS PI 7.5 STRL IVOR (GLOVE) ×2 IMPLANT
GOWN STRL REUS W/TWL LRG LVL3 (GOWN DISPOSABLE) ×4 IMPLANT
HANDPIECE INTERPULSE COAX TIP (DISPOSABLE) ×2
HOLDER FOLEY CATH W/STRAP (MISCELLANEOUS) ×1 IMPLANT
HOOD PEEL AWAY FLYTE STAYCOOL (MISCELLANEOUS) ×6 IMPLANT
IMMOBILIZER KNEE 20 (SOFTGOODS) ×2
IMMOBILIZER KNEE 20 THIGH 36 (SOFTGOODS) ×1 IMPLANT
KIT TURNOVER KIT A (KITS) IMPLANT
MANIFOLD NEPTUNE II (INSTRUMENTS) ×2 IMPLANT
NS IRRIG 1000ML POUR BTL (IV SOLUTION) ×2 IMPLANT
PACK ICE MAXI GEL EZY WRAP (MISCELLANEOUS) ×2 IMPLANT
PACK TOTAL KNEE CUSTOM (KITS) ×2 IMPLANT
PATELLA STD 34X8.5 (Orthopedic Implant) ×1 IMPLANT
PEG FEMORAL DISTAL (Knees) IMPLANT
PENCIL SMOKE EVACUATOR (MISCELLANEOUS) IMPLANT
PLATE INTERLOK 6700 (Plate) ×1 IMPLANT
PROTECTOR NERVE ULNAR (MISCELLANEOUS) ×2 IMPLANT
SET HNDPC FAN SPRY TIP SCT (DISPOSABLE) ×1 IMPLANT
SUT VIC AB 1 CT1 36 (SUTURE) ×4 IMPLANT
SUT VIC AB 2-0 CT1 27 (SUTURE) ×2
SUT VIC AB 2-0 CT1 TAPERPNT 27 (SUTURE) ×1 IMPLANT
SUT VIC AB 3-0 SH 8-18 (SUTURE) ×2 IMPLANT
TIBIA BEAR PS 63/67X10 KNEE (Miscellaneous) ×2 IMPLANT
TRAY FOLEY MTR SLVR 16FR STAT (SET/KITS/TRAYS/PACK) ×2 IMPLANT
WATER STERILE IRR 1000ML POUR (IV SOLUTION) ×4 IMPLANT
WRAP KNEE MAXI GEL POST OP (GAUZE/BANDAGES/DRESSINGS) ×1 IMPLANT

## 2019-12-20 NOTE — Op Note (Signed)
DATE OF SURGERY:  12/20/2019 TIME: 9:58 AM  PATIENT NAME:  Becky Davis   AGE: 69 y.o.    PRE-OPERATIVE DIAGNOSIS: Right knee primary localized osteoarthritis  POST-OPERATIVE DIAGNOSIS:  Same  PROCEDURE: RIGHT total Knee Arthroplasty  SURGEON:  Johnny Bridge, MD   ASSISTANT:  Merlene Pulling, PA-C, present and scrubbed throughout the case, critical for assistance with exposure, retraction, instrumentation, and closure.   OPERATIVE IMPLANTS: [employer]omet Vanguard Fixed Bearing Posterior Stabilized Femur size 60, Tibia size 67, Patella size 34 3-peg oval button, with a 10 mm polyethylene insert.   PREOPERATIVE INDICATIONS:  Becky Davis is a 69 y.o. year old female with end stage bone on bone degenerative arthritis of the knee who failed conservative treatment, including injections, antiinflammatories, activity modification, and assistive devices, and had significant impairment of their activities of daily living, and elected for Total Knee Arthroplasty.   The risks, benefits, and alternatives were discussed at length including but not limited to the risks of infection, bleeding, nerve injury, stiffness, blood clots, the need for revision surgery, cardiopulmonary complications, among others, and they were willing to proceed.  OPERATIVE FINDINGS AND UNIQUE ASPECTS OF THE CASE: Her anatomy was fairly small.  I cut the distal femur twice.  I had slight notching on the lateral aspect of the distal femur because she was so narrow medial to lateral, and I wanted to have the component slightly lateral in position given her significant lateral patellar subluxation in order to optimize tracking, so I did have a slight amount of notching, although the medial side was flush.  The patella tracked well at the completion of the case.  The patella had fairly extreme wear on the lateral facet, and I had a little portion of the superolateral aspect of the button that did not have flush position with the bone,  because of the scalloped out nature.  This void was filled with cement.  ESTIMATED BLOOD LOSS: 150 mL.  OPERATIVE DESCRIPTION:  The patient was brought to the operative room and placed in a supine position.  Spinal anesthesia was administered.  IV antibiotics were given.  The lower extremity was prepped and draped in the usual sterile fashion.  Time out was performed.  The leg was elevated and exsanguinated and the tourniquet was inflated.  Anterior quadriceps tendon splitting approach was performed.  The patella was everted and osteophytes were removed.  The anterior horn of the medial and lateral meniscus was removed.   The distal femur was opened with the drill and the intramedullary distal femoral cutting jig was utilized, set at 5 degrees resecting 10 mm off the distal femur.  Care was taken to protect the collateral ligaments.  Then the extramedullary tibial cutting jig was utilized making the appropriate cut using the anterior tibial crest as a reference building in appropriate posterior slope.  Care was taken during the cut to protect the medial and collateral ligaments.  The proximal tibia was removed along with the posterior horns of the menisci.  The PCL was sacrificed.    The extensor gap was measured and was approximately 8 mm, so I went and took another 2 mm off, removing it off of the femur, and then had a gap of 30mm.    The distal femoral sizing jig was applied, taking care to avoid notching.  Then the 4-in-1 cutting jig was applied and the anterior and posterior femur was cut, along with the chamfer cuts.  All posterior osteophytes were removed.  The  flexion gap was then measured and was symmetric with the extension gap.  I completed the distal femoral preparation using the appropriate jig to prepare the box.  Initially however, the medial lateral sized was fairly small, so I actually went from a 67 down to a 60, in order to fit the medial lateral alignment appropriately.  This  had just a very slight amount of lateral notching.  The patella was then measured, and cut with the saw.  The thickness before the cut was 20 and after the cut was 14.5.  The proximal tibia sized and prepared accordingly with the reamer and the punch, and then all components were trialed with the 59mm poly insert.  The knee was found to have excellent balance and full motion.    The above named components were then cemented into place and all excess cement was removed.  The real polyethylene implant was placed.  After the cement had cured I released the tourniquet and confirmed excellent hemostasis with no major posterior vessel injury.    The knee was easily taken through a range of motion and the patella tracked well and the knee irrigated copiously and the parapatellar and subcutaneous tissue closed with vicryl, and monocryl with steri strips for the skin.  The wounds were injected with marcaine, and dressed with sterile gauze and the patient was awakened and returned to the PACU in stable and satisfactory condition.  There were no complications.  Total tourniquet time was 90 minutes.

## 2019-12-20 NOTE — Anesthesia Procedure Notes (Signed)
Spinal  Patient location during procedure: OR Staffing Anesthesiologist: Nolon Nations, MD Preanesthetic Checklist Completed: patient identified, IV checked, site marked, risks and benefits discussed, surgical consent, monitors and equipment checked, pre-op evaluation and timeout performed Spinal Block Patient position: sitting Prep: DuraPrep and site prepped and draped Patient monitoring: heart rate, cardiac monitor, continuous pulse ox and blood pressure Approach: midline Location: L3-4 Injection technique: single-shot Needle Needle type: Sprotte  Needle gauge: 24 G Needle length: 9 cm Assessment Sensory level: T4

## 2019-12-20 NOTE — Anesthesia Procedure Notes (Signed)
Anesthesia Regional Block: Adductor canal block   Pre-Anesthetic Checklist: ,, timeout performed, Correct Patient, Correct Site, Correct Laterality, Correct Procedure, Correct Position, site marked, Risks and benefits discussed,  Surgical consent,  Pre-op evaluation,  At surgeon's request and post-op pain management  Laterality: Right  Prep: chloraprep       Needles:  Injection technique: Single-shot  Needle Type: Stimiplex     Needle Length: 9cm  Needle Gauge: 21     Additional Needles:   Narrative:  Start time: 12/20/2019 7:03 AM End time: 12/20/2019 7:08 AM Injection made incrementally with aspirations every 5 mL.  Performed by: Personally  Anesthesiologist: Nolon Nations, MD  Additional Notes: BP cuff, EKG monitors applied. Sedation begun. Artery and nerve location verified with U/S and anesthetic injected incrementally, slowly, and after negative aspirations under direct u/s guidance. Good fascial /perineural spread. Tolerated well.

## 2019-12-20 NOTE — Anesthesia Postprocedure Evaluation (Signed)
Anesthesia Post Note  Patient: Becky Davis  Procedure(s) Performed: TOTAL KNEE ARTHROPLASTY (Right Knee)     Patient location during evaluation: PACU Anesthesia Type: Spinal Level of consciousness: awake and alert Pain management: pain level controlled Vital Signs Assessment: post-procedure vital signs reviewed and stable Respiratory status: spontaneous breathing Cardiovascular status: stable Anesthetic complications: no    Last Vitals:  Vitals:   12/20/19 1300 12/20/19 1400  BP: 128/74 136/86  Pulse: (!) 58 78  Resp: 15 16  Temp: 36.6 C   SpO2: 99% 99%    Last Pain:  Vitals:   12/20/19 1400  TempSrc:   PainSc: 2     LLE Motor Response: Purposeful movement (12/20/19 1400) LLE Sensation: Decreased (12/20/19 1400) RLE Motor Response: Purposeful movement (12/20/19 1400) RLE Sensation: Decreased (12/20/19 1400) L Sensory Level: S4-Perianal area (12/20/19 1400) R Sensory Level: S4-Perianal area (12/20/19 1400)  Nolon Nations

## 2019-12-20 NOTE — Evaluation (Signed)
Physical Therapy Evaluation Patient Details Name: Becky Davis MRN: [id number] DOB: Jul 27, 1950 Today's Date: 12/20/2019   History of Present Illness  Pt s/p R TKR  Clinical Impression  Pt s/p R TKR and presents with decreased R LE strength/ROM and post op pain limiting functional mobility.  Pt should progress to dc home with family assist.  Per chart, HHPT to follow.    Follow Up Recommendations Home health PT    Equipment Recommendations  Rolling walker with 5" wheels;3in1 (PT)    Recommendations for Other Services       Precautions / Restrictions Precautions Precautions: Fall;Knee Required Braces or Orthoses: Knee Immobilizer - Right Knee Immobilizer - Right: Discontinue once straight leg raise with < 10 degree lag Restrictions Weight Bearing Restrictions: No Other Position/Activity Restrictions: WBAT      Mobility  Bed Mobility Overal bed mobility: Needs Assistance Bed Mobility: Supine to Sit     Supine to sit: Min guard;HOB elevated     General bed mobility comments: HOB elevated and use of bedrail  Transfers Overall transfer level: Needs assistance Equipment used: Rolling walker (2 wheeled) Transfers: Sit to/from Stand Sit to Stand: Min assist;Mod assist;From elevated surface         General transfer comment: cues for LE management and use of UEs to self assist; physical assist to bring wt up and fwd  Ambulation/Gait Ambulation/Gait assistance: Min assist Gait Distance (Feet): 26 Feet Assistive device: Rolling walker (2 wheeled) Gait Pattern/deviations: Step-to pattern;Decreased step length - right;Decreased step length - left;Shuffle;Trunk flexed Gait velocity: decr   General Gait Details: cues for posture, position from RW and sequence;  Distance ltd by fatigue  Stairs            Wheelchair Mobility    Modified Rankin (Stroke Patients Only)       Balance Overall balance assessment: Needs assistance Sitting-balance support: No upper  extremity supported;Feet supported Sitting balance-Leahy Scale: Good     Standing balance support: Bilateral upper extremity supported Standing balance-Leahy Scale: Poor                               Pertinent Vitals/Pain Pain Assessment: 0-10 Pain Score: 2  Pain Location: R knee Pain Descriptors / Indicators: Aching;Sore Pain Intervention(s): Limited activity within patient's tolerance;Monitored during session;Premedicated before session;Ice applied    Home Living Family/patient expects to be discharged to:: Private residence Living Arrangements: Spouse/significant other Available Help at Discharge: Family Type of Home: House Home Access: Stairs to enter Entrance Stairs-Rails: None Entrance Stairs-Number of Steps: 2 Home Layout: Two level Home Equipment: Cane - single point Additional Comments: Pt states she may stay on sofa first night or two    Prior Function Level of Independence: Independent with assistive device(s)         Comments: used cane outside and furniture inside     Hand Dominance        Extremity/Trunk Assessment   Upper Extremity Assessment Upper Extremity Assessment: Overall WFL for tasks assessed    Lower Extremity Assessment Lower Extremity Assessment: RLE deficits/detail       Communication   Communication: No difficulties  Cognition Arousal/Alertness: Awake/alert Behavior During Therapy: WFL for tasks assessed/performed Overall Cognitive Status: Within Functional Limits for tasks assessed  General Comments      Exercises     Assessment/Plan    PT Assessment Patient needs continued PT services  PT Problem List Decreased strength;Decreased range of motion;Decreased activity tolerance;Decreased balance;Decreased mobility;Decreased knowledge of use of DME;Pain;Obesity       PT Treatment Interventions DME instruction;Gait training;Stair training;Functional  mobility training;Therapeutic activities;Therapeutic exercise;Balance training;Patient/family education    PT Goals (Current goals can be found in the Care Plan section)  Acute Rehab PT Goals Patient Stated Goal: Regain IND PT Goal Formulation: With patient Time For Goal Achievement: 12/27/19 Potential to Achieve Goals: Good    Frequency 7X/week   Barriers to discharge        Co-evaluation               AM-PAC PT "6 Clicks" Mobility  Outcome Measure Help needed turning from your back to your side while in a flat bed without using bedrails?: A Little Help needed moving from lying on your back to sitting on the side of a flat bed without using bedrails?: A Little Help needed moving to and from a bed to a chair (including a wheelchair)?: A Little Help needed standing up from a chair using your arms (e.g., wheelchair or bedside chair)?: A Little Help needed to walk in hospital room?: A Little Help needed climbing 3-5 steps with a railing? : A Lot 6 Click Score: 17    End of Session Equipment Utilized During Treatment: Gait belt;Right knee immobilizer Activity Tolerance: Patient tolerated treatment well;Patient limited by fatigue Patient left: in chair;with call bell/phone within reach;with family/visitor present Nurse Communication: Mobility status PT Visit Diagnosis: Difficulty in walking, not elsewhere classified (R26.2)    Time: 5409-8119 PT Time Calculation (min) (ACUTE ONLY): 24 min   Charges:   PT Evaluation $PT Eval Low Complexity: 1 Low PT Treatments $Gait Training: 8-22 mins        Debe Coder PT Acute Rehabilitation Services Pager [phone] Office [phone]   Costa Jha 12/20/2019, 5:09 PM

## 2019-12-20 NOTE — Transfer of Care (Signed)
Immediate Anesthesia Transfer of Care Note  Patient: Becky Davis  Procedure(s) Performed: TOTAL KNEE ARTHROPLASTY (Right Knee)  Patient Location: PACU  Anesthesia Type:Regional, Spinal and MAC combined with regional for post-op pain  Level of Consciousness: awake, alert , oriented and patient cooperative  Airway & Oxygen Therapy: Patient Spontanous Breathing and Patient connected to face mask oxygen  Post-op Assessment: Report given to RN and Post -op Vital signs reviewed and stable  Post vital signs: stable  Last Vitals:  Vitals Value Taken Time  BP 122/71 12/20/19 1020  Temp    Pulse 59 12/20/19 1022  Resp 15 12/20/19 1022  SpO2 99 % 12/20/19 1022  Vitals shown include unvalidated device data.  Last Pain:  Vitals:   12/20/19 0550  TempSrc: Oral         Complications: No apparent anesthesia complications

## 2019-12-20 NOTE — H&P (Signed)
PREOPERATIVE H&P  Chief Complaint: right knee pain  HPI: Becky Davis is a 69 y.o. female who presents for preoperative history and physical with a diagnosis of right knee oa. Symptoms are rated as moderate to severe, and have been worsening.  This is significantly impairing activities of daily living.  She has elected for surgical management.   She has failed injections, activity modification, anti-inflammatories, and assistive devices.  Preoperative X-rays demonstrate end stage degenerative changes with osteophyte formation, loss of joint space, subchondral sclerosis.   Past Medical History:  Diagnosis Date  . Arthritis    knees  . Asthma   . Headache    hx of 20 years ago  . Heart murmur    hx of 10-15 years ago  . Hypertension   . Hypothyroidism   . Pneumonia   . Pre-diabetes   . Thyroid nodule    Past Surgical History:  Procedure Laterality Date  . ABDOMINAL HYSTERECTOMY    . thyroid goiter     surgery  . THYROID LOBECTOMY     06-25-17 Dr. Harlow Asa   Left  . THYROID LOBECTOMY Left 06/25/2017   Procedure: LEFT THYROID LOBECTOMY;  Surgeon: Armandina Gemma, MD;  Location: [facility];  Service: General;  Laterality: Left;   Social History   Socioeconomic History  . Marital status: Married    Spouse name: Not on file  . Number of children: 3  . Years of education: Not on file  . Highest education level: Not on file  Occupational History  . Not on file  Tobacco Use  . Smoking status: Never Smoker  . Smokeless tobacco: Never Used  Substance and Sexual Activity  . Alcohol use: No  . Drug use: No  . Sexual activity: Not Currently  Other Topics Concern  . Not on file  Social History Narrative  . Not on file   Social Determinants of Health   Financial Resource Strain:   . Difficulty of Paying Living Expenses:   Food Insecurity:   . Worried About Charity fundraiser in the Last Year:   . Arboriculturist in the Last Year:   Transportation Needs:   . Lexicographer (Medical):   Marland Kitchen Lack of Transportation (Non-Medical):   Physical Activity:   . Days of Exercise per Week:   . Minutes of Exercise per Session:   Stress:   . Feeling of Stress :   Social Connections:   . Frequency of Communication with Friends and Family:   . Frequency of Social Gatherings with Friends and Family:   . Attends Religious Services:   . Active Member of Clubs or Organizations:   . Attends Archivist Meetings:   Marland Kitchen Marital Status:    Family History  Problem Relation Age of Onset  . Emphysema Mother   . Cancer Mother   . Heart disease Mother   . Cancer Sister   . Cancer Brother    Allergies  Allergen Reactions  . Prednisone     Had depression in 1991 after extended use of predisone. Pt has taken this medication since in a short time period, and has had no reaction.   Huel Cote Oil Rash  . Penicillins Rash    Has patient had a PCN reaction causing immediate rash, facial/tongue/throat swelling, SOB or lightheadedness with hypotension: no Has patient had a PCN reaction causing severe rash involving mucus membranes or skin necrosis: no Has patient had a PCN reaction that required hospitalization:  no Has patient had a PCN reaction occurring within the last 10 years: no If all of the above answers are "NO", then may proceed with Cephalosporin use.    Prior to Admission medications   Medication Sig Start Date End Date Taking? Authorizing Provider  ARIPiprazole (ABILIFY) 5 MG tablet Take 5 mg by mouth at bedtime.    Yes [provider]  budesonide-formoterol (SYMBICORT) 160-4.5 MCG/ACT inhaler Inhale 2 puffs into the lungs 2 (two) times daily. 08/06/18  Yes Mannam, Praveen, MD  calcitRIOL (ROCALTROL) 0.25 MCG capsule Take 0.25 mcg by mouth daily. 12/01/19  Yes [provider]  Calcium Carbonate-Vitamin D (CALTRATE 600+D PO) Take 500 mg by mouth 2 (two) times daily.    Yes [provider]  cetirizine (ZYRTEC) 10 MG tablet Take  10 mg by mouth daily. 10/12/19  Yes [provider]  ibuprofen (ADVIL) 200 MG tablet Take 200 mg by mouth every 6 (six) hours as needed for moderate pain.   Yes [provider]  levothyroxine (SYNTHROID, LEVOTHROID) 100 MCG tablet Take 100 mcg by mouth daily before breakfast.   Yes [provider]  losartan (COZAAR) 100 MG tablet Take 100 mg by mouth daily.    Yes [provider]  montelukast (SINGULAIR) 10 MG tablet Take 10 mg by mouth at bedtime.   Yes [provider]  PARoxetine (PAXIL) 20 MG tablet Take 20 mg by mouth at bedtime.    Yes [provider]  albuterol (PROAIR HFA) 108 (90 Base) MCG/ACT inhaler Inhale 2 puffs into the lungs as needed (Shortness of breath). 03/30/19   Martyn Ehrich, NP  albuterol (PROVENTIL) (2.5 MG/3ML) 0.083% nebulizer solution Take 2.5 mg by nebulization every 4 (four) hours as needed for wheezing or shortness of breath.    [provider]     Positive ROS: All other systems have been reviewed and were otherwise negative with the exception of those mentioned in the HPI and as above.  Physical Exam: General: Alert, no acute distress Cardiovascular: No pedal edema Respiratory: No cyanosis, no use of accessory musculature GI: No organomegaly, abdomen is soft and non-tender Skin: No lesions in the area of chief complaint Neurologic: Sensation intact distally Psychiatric: Patient is competent for consent with normal mood and affect Lymphatic: No axillary or cervical lymphadenopathy  MUSCULOSKELETAL: right knee with crepitance and a painful arc  Assessment: Right knee oa   Plan: Plan for Procedure(s): TOTAL KNEE ARTHROPLASTY  The risks benefits and alternatives were discussed with the patient including but not limited to the risks of nonoperative treatment, versus surgical intervention including infection, bleeding, nerve injury,  blood clots, cardiopulmonary complications, morbidity,  mortality, among others, and they were willing to proceed.    Patient's anticipated LOS is less than 2 midnights, meeting these requirements: - Younger than 3 - Lives within 1 hour of care - Has a competent adult at home to recover with post-op recover - NO history of  - Chronic pain requiring opiods  - Diabetes  - Coronary Artery Disease  - Heart failure  - Heart attack  - Stroke  - DVT/VTE  - Cardiac arrhythmia  - Respiratory Failure/COPD  - Renal failure  - Anemia  - Advanced Liver disease        Johnny Bridge, MD Cell [phone]   12/20/2019 7:26 AM

## 2019-12-20 NOTE — Progress Notes (Signed)
Pt does not want to take [facility] while here in the hospital.  Pt prefers to take her symbicort from home instead and asked that I send the [facility] back to pharmacy so she is not charged.

## 2019-12-21 DIAGNOSIS — Z7951 Long term (current) use of inhaled steroids: Secondary | ICD-10-CM | POA: Diagnosis not present

## 2019-12-21 DIAGNOSIS — R7303 Prediabetes: Secondary | ICD-10-CM | POA: Diagnosis not present

## 2019-12-21 DIAGNOSIS — Z79899 Other long term (current) drug therapy: Secondary | ICD-10-CM | POA: Diagnosis not present

## 2019-12-21 DIAGNOSIS — E039 Hypothyroidism, unspecified: Secondary | ICD-10-CM | POA: Diagnosis not present

## 2019-12-21 DIAGNOSIS — M1711 Unilateral primary osteoarthritis, right knee: Secondary | ICD-10-CM | POA: Diagnosis not present

## 2019-12-21 DIAGNOSIS — I10 Essential (primary) hypertension: Secondary | ICD-10-CM | POA: Diagnosis not present

## 2019-12-21 LAB — CBC
HCT: 32 % — ABNORMAL LOW (ref 36.0–46.0)
Hemoglobin: 10.6 g/dL — ABNORMAL LOW (ref 12.0–15.0)
MCH: 29.7 pg (ref 26.0–34.0)
MCHC: 33.1 g/dL (ref 30.0–36.0)
MCV: 89.6 fL (ref 80.0–100.0)
Platelets: 242 10*3/uL (ref 150–400)
RBC: 3.57 MIL/uL — ABNORMAL LOW (ref 3.87–5.11)
RDW: 12.8 % (ref 11.5–15.5)
WBC: 16.4 10*3/uL — ABNORMAL HIGH (ref 4.0–10.5)
nRBC: 0 % (ref 0.0–0.2)

## 2019-12-21 LAB — BASIC METABOLIC PANEL
Anion gap: 8 (ref 5–15)
BUN: 19 mg/dL (ref 8–23)
CO2: 23 mmol/L (ref 22–32)
Calcium: 8.2 mg/dL — ABNORMAL LOW (ref 8.9–10.3)
Chloride: 102 mmol/L (ref 98–111)
Creatinine, Ser: 0.73 mg/dL (ref 0.44–1.00)
GFR calc Af Amer: >60 mL/min (ref >60)
GFR calc non Af Amer: >60 mL/min (ref >60)
Glucose, Bld: 150 mg/dL — ABNORMAL HIGH (ref 70–99)
Potassium: 4 mmol/L (ref 3.5–5.1)
Sodium: 133 mmol/L — ABNORMAL LOW (ref 135–145)

## 2019-12-21 MED ORDER — LOSARTAN POTASSIUM 100 MG PO TABS: 50.0000 mg | ORAL_TABLET | Freq: Every day | ORAL | 0 refills | Status: AC

## 2019-12-21 MED ORDER — LOSARTAN POTASSIUM 50 MG PO TABS
50.0000 mg | ORAL_TABLET | Freq: Every day | ORAL | Status: DC
  Administered 2019-12-22: 50 mg via ORAL
  Filled 2019-12-21: qty 1

## 2019-12-21 MED ORDER — SODIUM CHLORIDE 0.9 % IV BOLUS
500.0000 mL | Freq: Once | INTRAVENOUS | Status: AC
  Administered 2019-12-21: 500 mL via INTRAVENOUS

## 2019-12-21 NOTE — Progress Notes (Addendum)
Subjective: 1 Day Post-Op s/p Procedure(s): TOTAL KNEE ARTHROPLASTY   Patient is alert, oriented, sitting up eating breakfast. States pain is a 1-2/10. She is very happy with her progress with PT yesterday. Denies chest pain, SOB, Calf pain. No abdominal pain, nausea/vomiting. No other complaints.  Objective:  PE: VITALS:   Vitals:   12/20/19 1746 12/20/19 2317 12/21/19 0318 12/21/19 0634  BP: 128/74 131/66 140/65 120/71  Pulse: 78 66 66 (!) 53  Resp: 18 16 16 16   Temp: 98.2 F (36.8 C) 98 F (36.7 C) 97.9 F (36.6 C) 98.2 F (36.8 C)  TempSrc:  Oral Oral Oral  SpO2: 95% 96% 98% 95%  Weight:      Height:       General: laying in bed, in no acute distress Resp: No use of accessory musculature ABD soft Neurovascular intact Sensation intact distally Intact pulses distally Dorsiflexion/Plantar flexion intact Incision: moderate drainage, dressing changed  LABS  Results for orders placed or performed during the hospital encounter of 12/20/19 (from the past 24 hour(s))  CBC     Status: Abnormal   Collection Time: 12/21/19  3:00 AM  Result Value Ref Range   WBC 16.4 (H) 4.0 - 10.5 K/uL   RBC 3.57 (L) 3.87 - 5.11 MIL/uL   Hemoglobin 10.6 (L) 12.0 - 15.0 g/dL   HCT 32.0 (L) 36.0 - 46.0 %   MCV 89.6 80.0 - 100.0 fL   MCH 29.7 26.0 - 34.0 pg   MCHC 33.1 30.0 - 36.0 g/dL   RDW 12.8 11.5 - 15.5 %   Platelets 242 150 - 400 K/uL   nRBC 0.0 0.0 - 0.2 %  Basic metabolic panel     Status: Abnormal   Collection Time: 12/21/19  3:00 AM  Result Value Ref Range   Sodium 133 (L) 135 - 145 mmol/L   Potassium 4.0 3.5 - 5.1 mmol/L   Chloride 102 98 - 111 mmol/L   CO2 23 22 - 32 mmol/L   Glucose, Bld 150 (H) 70 - 99 mg/dL   BUN 19 8 - 23 mg/dL   Creatinine, Ser 0.73 0.44 - 1.00 mg/dL   Calcium 8.2 (L) 8.9 - 10.3 mg/dL   GFR calc non Af Amer >60 >60 mL/min   GFR calc Af Amer >60 >60 mL/min   Anion gap 8 5 - 15    DG Knee Right Port  Result Date: 12/20/2019 CLINICAL DATA:   Post RIGHT total knee arthroplasty EXAM: PORTABLE RIGHT KNEE - 1-2 VIEW COMPARISON:  Portable exam 1047 hours compared to 09/05/2019 FINDINGS: Interval placement of components of a RIGHT knee prosthesis. Mild diffuse osseous demineralization. No acute fracture, dislocation, or bone destruction. No periprosthetic lucency. IMPRESSION: RIGHT knee prosthesis without acute complication. Electronically Signed   By: Lavonia Dana M.D.   On: 12/20/2019 11:25    Assessment/Plan: Principal Problem:   Osteoarthritis of right knee Active Problems:   S/P TKR (total knee replacement), right  1 Day Post-Op s/p Procedure(s):TOTAL KNEE ARTHROPLASTY  Weightbearing: WBAT RLE, up with therapy Insicional and dressing care: Reinforce dressings as needed, new aquacell placed this morning, ok to leave on at discharge VTE prophylaxis: Aspirin 325mg  BID x 30 days Pain control: continue current regimen, pain so far well controlled Follow - up plan: Follow-up with Dr. Mardelle Matte in 2 weeks Dispo: ok to be discharged home if ok'd after therapy today Contact information:   Weekdays 8-5 Merlene Pulling, PA-C [phone] A fter hours and holidays please  check [email or web address] for group call information for [facility]  Ventura Bruns 12/21/2019, 8:25 AM

## 2019-12-21 NOTE — Progress Notes (Signed)
Physical Therapy Treatment Patient Details Name: Becky Davis MRN: [id number] DOB: 1951-06-21 Today's Date: 12/21/2019    History of Present Illness Pt s/p R TKR    PT Comments    Pt reports drop in BP after first session however has received fluid bolus.  Pt ambulated in hallway and denies dizziness however reports feeling fatigued and "not feeling well" but does not further explain.  Pt reports concern of BP and HR today and states she plans to d/c home tomorrow. Will need to practice steps prior to d/c.    Follow Up Recommendations  Home health PT     Equipment Recommendations  Rolling walker with 5" wheels;3in1 (PT)    Recommendations for Other Services       Precautions / Restrictions Precautions Precautions: Fall;Knee Precaution Comments: able to perform SLR Restrictions Other Position/Activity Restrictions: WBAT    Mobility  Bed Mobility Overal bed mobility: Needs Assistance Bed Mobility: Supine to Sit;Sit to Supine     Supine to sit: Supervision Sit to supine: Supervision      Transfers Overall transfer level: Needs assistance Equipment used: Rolling walker (2 wheeled) Transfers: Sit to/from Stand Sit to Stand: Min guard         General transfer comment: min/guard for safety  Ambulation/Gait Ambulation/Gait assistance: Min guard Gait Distance (Feet): 40 Feet Assistive device: Rolling walker (2 wheeled) Gait Pattern/deviations: Step-through pattern;Antalgic;Decreased stance time - right Gait velocity: decr   General Gait Details: cues for posture, position from RW and sequence; pt reports distance limited by "not feeling well" however denies dizziness; vitals obtained upon return to supine and BP 137/66 mmHg   Stairs             Wheelchair Mobility    Modified Rankin (Stroke Patients Only)       Balance                                            Cognition Arousal/Alertness: Awake/alert Behavior During  Therapy: WFL for tasks assessed/performed Overall Cognitive Status: Within Functional Limits for tasks assessed                                        Exercises Total Joint Exercises Ankle Circles/Pumps: AROM;10 reps;Both Quad Sets: AROM;Both;10 reps Short Arc Quad: AROM;Seated;Right;10 reps Heel Slides: AAROM;Right;10 reps;Seated Hip ABduction/ADduction: AROM;Right;10 reps Straight Leg Raises: AROM;Right;5 reps    General Comments        Pertinent Vitals/Pain Pain Assessment: 0-10 Pain Score: 5  Pain Location: R knee Pain Descriptors / Indicators: Aching;Sore Pain Intervention(s): Repositioned;Monitored during session;Ice applied    Home Living                      Prior Function            PT Goals (current goals can now be found in the care plan section) Progress towards PT goals: Progressing toward goals    Frequency    7X/week      PT Plan Current plan remains appropriate    Co-evaluation              AM-PAC PT "6 Clicks" Mobility   Outcome Measure  Help needed turning from your back to your side while in a flat bed without  using bedrails?: A Little Help needed moving from lying on your back to sitting on the side of a flat bed without using bedrails?: A Little Help needed moving to and from a bed to a chair (including a wheelchair)?: A Little Help needed standing up from a chair using your arms (e.g., wheelchair or bedside chair)?: A Little Help needed to walk in hospital room?: A Little Help needed climbing 3-5 steps with a railing? : A Little 6 Click Score: 18    End of Session Equipment Utilized During Treatment: Gait belt Activity Tolerance: Patient tolerated treatment well Patient left: with call bell/phone within reach;in bed;with family/visitor present   PT Visit Diagnosis: Difficulty in walking, not elsewhere classified (R26.2)     Time: 5035-4656 PT Time Calculation (min) (ACUTE ONLY): 16 min  Charges:   $Gait Training: 8-22 mins                      Arlyce Dice, DPT Acute Rehabilitation Services Pager: [phone] Office: [phone]   Ezequiel Macauley,KATHrine E 12/21/2019, 2:05 PM

## 2019-12-21 NOTE — Progress Notes (Signed)
Physical Therapy Treatment Patient Details Name: Becky Davis MRN: [id number] DOB: 1951-02-11 Today's Date: 12/21/2019    History of Present Illness Pt s/p R TKR    PT Comments    Pt assisted with ambulating in hallway and then performed LE exercises.  Pt anticipates d/c home later today.  Will return to practice safe stair technique.    Home health PT     Equipment Recommendations  Rolling walker with 5" wheels;3in1 (PT)    Recommendations for Other Services       Precautions / Restrictions Precautions Precautions: Fall;Knee Precaution Comments: able to perform SLR Restrictions Other Position/Activity Restrictions: WBAT    Mobility  Bed Mobility Overal bed mobility: Needs Assistance Bed Mobility: Supine to Sit     Supine to sit: Supervision        Transfers Overall transfer level: Needs assistance Equipment used: Rolling walker (2 wheeled) Transfers: Sit to/from Stand Sit to Stand: Min guard         General transfer comment: pt able to recall correct positioning, min/guard for safety  Ambulation/Gait Ambulation/Gait assistance: Min guard Gait Distance (Feet): 60 Feet Assistive device: Rolling walker (2 wheeled) Gait Pattern/deviations: Step-to pattern;Step-through pattern;Antalgic     General Gait Details: cues for posture, position from RW and sequence   Stairs             Wheelchair Mobility    Modified Rankin (Stroke Patients Only)       Balance                                            Cognition Arousal/Alertness: Awake/alert Behavior During Therapy: WFL for tasks assessed/performed Overall Cognitive Status: Within Functional Limits for tasks assessed                                        Exercises Total Joint Exercises Ankle Circles/Pumps: AROM;10 reps;Both Quad Sets: AROM;Both;10 reps Short Arc Quad: AROM;Seated;Right;10 reps Heel Slides: AAROM;Right;10 reps;Seated Hip  ABduction/ADduction: AROM;Right;10 reps Straight Leg Raises: AROM;Right;5 reps    General Comments        Pertinent Vitals/Pain Pain Assessment: 0-10 Pain Score: 3  Pain Location: R knee Pain Descriptors / Indicators: Aching;Sore Pain Intervention(s): Repositioned;Monitored during session    Home Living                      Prior Function            PT Goals (current goals can now be found in the care plan section) Progress towards PT goals: Progressing toward goals    Frequency    7X/week      PT Plan Current plan remains appropriate    Co-evaluation              AM-PAC PT "6 Clicks" Mobility   Outcome Measure  Help needed turning from your back to your side while in a flat bed without using bedrails?: A Little Help needed moving from lying on your back to sitting on the side of a flat bed without using bedrails?: A Little Help needed moving to and from a bed to a chair (including a wheelchair)?: A Little Help needed standing up from a chair using your arms (e.g., wheelchair or bedside chair)?: A Little Help needed  to walk in hospital room?: A Little Help needed climbing 3-5 steps with a railing? : A Little 6 Click Score: 18    End of Session Equipment Utilized During Treatment: Gait belt Activity Tolerance: Patient tolerated treatment well Patient left: in chair;with call bell/phone within reach;with chair alarm set   PT Visit Diagnosis: Difficulty in walking, not elsewhere classified (R26.2)     Time: 6754-4920 PT Time Calculation (min) (ACUTE ONLY): 16 min  Charges:  $Therapeutic Exercise: 8-22 mins                     Arlyce Dice, DPT Acute Rehabilitation Services Pager: [phone] Office: [phone]   York Ram E 12/21/2019, 12:23 PM

## 2019-12-21 NOTE — Progress Notes (Signed)
Patient worked with PT this morning, felt dizzy after PT. BP was taken showing 91/57. Bolus was given and BP increased to 117/60. Patient was very worried regarding dizziness and BP as she has had two pre-syncopal episodes in the past and asked to be seen by her cardiologist, Dr. Rae Lips. Orthostatic vitals ordered and called on-call cardiologist for [facility], Dr. Terri Skains, who recommended decreasing her losartan dose to 50 mg and close outpatient follow-up at their office. Will plan to keep patient overnight to monitor with likely discharge tomorrow.

## 2019-12-21 NOTE — Progress Notes (Signed)
Pt is concerned regarding her dizziness and requesting to follow up with her cardiologist Dr Virgina Jock. Pt verbalized that she does not feel safe for discharge. PA is paged and notified.

## 2019-12-21 NOTE — Plan of Care (Signed)
Plan of care reviewed and discussed with the patient. 

## 2019-12-21 NOTE — Discharge Planning (Deleted)
Discharge Summary  Patient ID: Becky Davis MRN: [id number] DOB/AGE: [birth year] 69 y.o.  Admit date: 12/20/2019 Discharge date: 12/21/2019  Admission Diagnoses:  Osteoarthritis of right knee  Discharge Diagnoses:  Principal Problem:   Osteoarthritis of right knee Active Problems:   S/P TKR (total knee replacement), right   Past Medical History:  Diagnosis Date  . Arthritis    knees  . Asthma   . Headache    hx of 20 years ago  . Heart murmur    hx of 10-15 years ago  . Hypertension   . Hypothyroidism   . Pneumonia   . Pre-diabetes   . Thyroid nodule     Surgeries: Procedure(s): TOTAL KNEE ARTHROPLASTY on 12/20/2019   Consultants (if any):   Discharged Condition: Improved  Hospital Course: JEN BENEDICT is an 69 y.o. female who was admitted 12/20/2019 with a diagnosis of Osteoarthritis of right knee and went to the operating room on 12/20/2019 and underwent the above named procedures.    She was given perioperative antibiotics:  Anti-infectives (From admission, onward)   Start     Dose/Rate Route Frequency Ordered Stop   12/20/19 1401  ceFAZolin (ANCEF) 2-4 GM/100ML-% IVPB    Note to Pharmacy: Marylyn Ishihara   : cabinet override      12/20/19 1401 12/21/19 0214   12/20/19 1400  ceFAZolin (ANCEF) IVPB 2g/100 mL premix     2 g 200 mL/hr over 30 Minutes Intravenous Every 6 hours 12/20/19 1155 12/20/19 2120   12/20/19 0600  ceFAZolin (ANCEF) IVPB 2g/100 mL premix     2 g 200 mL/hr over 30 Minutes Intravenous On call to O.R. 12/20/19 0354 12/20/19 0750    .  She was given sequential compression devices, early ambulation, and aspirin for DVT prophylaxis.  She benefited maximally from the hospital stay and there were no complications.    Recent vital signs:  Vitals:   12/21/19 0318 12/21/19 0634  BP: 140/65 120/71  Pulse: 66 (!) 53  Resp: 16 16  Temp: 97.9 F (36.6 C) 98.2 F (36.8 C)  SpO2: 98% 95%    Recent laboratory studies:  Lab Results  Component  Value Date   HGB 10.6 (L) 12/21/2019   HGB 12.4 12/13/2019   HGB 11.5 (L) 09/28/2018   Lab Results  Component Value Date   WBC 16.4 (H) 12/21/2019   PLT 242 12/21/2019   No results found for: INR Lab Results  Component Value Date   NA 133 (L) 12/21/2019   K 4.0 12/21/2019   CL 102 12/21/2019   CO2 23 12/21/2019   BUN 19 12/21/2019   CREATININE 0.73 12/21/2019   GLUCOSE 150 (H) 12/21/2019    Discharge Medications:   Allergies as of 12/21/2019      Reactions   Prednisone    Had depression in 1991 after extended use of predisone. Pt has taken this medication since in a short time period, and has had no reaction.    Juniper Oil Rash   Penicillins Rash   Patient received ancef on 12/20/19 with no adverse reaction.      Medication List    STOP taking these medications   ibuprofen 200 MG tablet Commonly known as: ADVIL     TAKE these medications   albuterol (2.5 MG/3ML) 0.083% nebulizer solution Commonly known as: PROVENTIL Take 2.5 mg by nebulization every 4 (four) hours as needed for wheezing or shortness of breath.   albuterol 108 (90 Base) MCG/ACT inhaler Commonly known  as: ProAir HFA Inhale 2 puffs into the lungs as needed (Shortness of breath).   ARIPiprazole 5 MG tablet Commonly known as: ABILIFY Take 5 mg by mouth at bedtime.   aspirin EC 325 MG tablet Take 1 tablet (325 mg total) by mouth 2 (two) times daily.   baclofen 10 MG tablet Commonly known as: LIORESAL Take 1 tablet (10 mg total) by mouth 3 (three) times daily. As needed for muscle spasm   budesonide-formoterol 160-4.5 MCG/ACT inhaler Commonly known as: Symbicort Inhale 2 puffs into the lungs 2 (two) times daily.   calcitRIOL 0.25 MCG capsule Commonly known as: ROCALTROL Take 0.25 mcg by mouth daily.   CALTRATE 600+D PO Take 500 mg by mouth 2 (two) times daily.   cetirizine 10 MG tablet Commonly known as: ZYRTEC Take 10 mg by mouth daily.   HYDROcodone-acetaminophen 10-325 MG  tablet Commonly known as: Norco Take 1 tablet by mouth every 6 (six) hours as needed.   levothyroxine 100 MCG tablet Commonly known as: SYNTHROID Take 100 mcg by mouth daily before breakfast.   losartan 100 MG tablet Commonly known as: COZAAR Take 100 mg by mouth daily.   montelukast 10 MG tablet Commonly known as: SINGULAIR Take 10 mg by mouth at bedtime.   ondansetron 4 MG tablet Commonly known as: Zofran Take 1 tablet (4 mg total) by mouth every 8 (eight) hours as needed for nausea or vomiting.   PARoxetine 20 MG tablet Commonly known as: PAXIL Take 20 mg by mouth at bedtime.   sennosides-docusate sodium 8.6-50 MG tablet Commonly known as: SENOKOT-S Take 2 tablets by mouth daily.       Diagnostic Studies: DG Knee Right Port  Result Date: 12/20/2019 CLINICAL DATA:  Post RIGHT total knee arthroplasty EXAM: PORTABLE RIGHT KNEE - 1-2 VIEW COMPARISON:  Portable exam 1047 hours compared to 09/05/2019 FINDINGS: Interval placement of components of a RIGHT knee prosthesis. Mild diffuse osseous demineralization. No acute fracture, dislocation, or bone destruction. No periprosthetic lucency. IMPRESSION: RIGHT knee prosthesis without acute complication. Electronically Signed   By: Lavonia Dana M.D.   On: 12/20/2019 11:25    Disposition: Discharge disposition: 01-Home or Self Care         Follow-up Information    Marchia Bond, MD. Go on 01/02/2020.   Specialty: Orthopedic Surgery Why: your appointment has been scheduled for 9:30 am.  Contact information: [address] [address] [phone]        [facility].   Specialty: [place] Why: HHPT will see you at home for 5 visits prior to starting outpatient physical therapy  Contact information: [address] [place] [address] [phone]        [facility], [place]. Go on 01/02/2020.   Why: Your outpatient physical therapy  appointment is scheduled for 11:00. Please arrive by 10:30 to complete your paperwork  Contact information: [facility] [place] [address] [phone]            Signed: Jola Baptist 12/21/2019, 8:29 AM

## 2019-12-21 NOTE — Discharge Summary (Deleted)
Discharge Summary  Patient ID: Becky Davis MRN: [id number] DOB/AGE: 1950/07/21 69 y.o.  Admit date: 12/20/2019 Discharge date: 12/21/2019  Admission Diagnoses:  Osteoarthritis of right knee  Discharge Diagnoses:  Principal Problem:   Osteoarthritis of right knee Active Problems:   S/P TKR (total knee replacement), right   Past Medical History:  Diagnosis Date   Arthritis    knees   Asthma    Headache    hx of 20 years ago   Heart murmur    hx of 10-15 years ago   Hypertension    Hypothyroidism    Pneumonia    Pre-diabetes    Thyroid nodule     Surgeries: Procedure(s): TOTAL KNEE ARTHROPLASTY on 12/20/2019   Consultants (if any):   Discharged Condition: Improved  Hospital Course: Becky Davis is an 69 y.o. female who was admitted 12/20/2019 with a diagnosis of Osteoarthritis of right knee and went to the operating room on 12/20/2019 and underwent the above named procedures.    She was given perioperative antibiotics:  Anti-infectives (From admission, onward)   Start     Dose/Rate Route Frequency Ordered Stop   12/20/19 1401  ceFAZolin (ANCEF) 2-4 GM/100ML-% IVPB    Note to Pharmacy: Marylyn Ishihara   : cabinet override      12/20/19 1401 12/21/19 0214   12/20/19 1400  ceFAZolin (ANCEF) IVPB 2g/100 mL premix     2 g 200 mL/hr over 30 Minutes Intravenous Every 6 hours 12/20/19 1155 12/20/19 2120   12/20/19 0600  ceFAZolin (ANCEF) IVPB 2g/100 mL premix     2 g 200 mL/hr over 30 Minutes Intravenous On call to O.R. 12/20/19 4098 12/20/19 0750    .  She was given sequential compression devices, early ambulation, and aspirin for DVT prophylaxis.  She benefited maximally from the hospital stay and there were no complications.    Recent vital signs:  Vitals:   12/21/19 0634 12/21/19 0916  BP: 120/71 126/68  Pulse: (!) 53 68  Resp: 16 18  Temp: 98.2 F (36.8 C) 98.5 F (36.9 C)  SpO2: 95% 97%    Recent laboratory studies:  Lab Results  Component  Value Date   HGB 10.6 (L) 12/21/2019   HGB 12.4 12/13/2019   HGB 11.5 (L) 09/28/2018   Lab Results  Component Value Date   WBC 16.4 (H) 12/21/2019   PLT 242 12/21/2019   No results found for: INR Lab Results  Component Value Date   NA 133 (L) 12/21/2019   K 4.0 12/21/2019   CL 102 12/21/2019   CO2 23 12/21/2019   BUN 19 12/21/2019   CREATININE 0.73 12/21/2019   GLUCOSE 150 (H) 12/21/2019    Discharge Medications:   Allergies as of 12/21/2019      Reactions   Prednisone    Had depression in 1991 after extended use of predisone. Pt has taken this medication since in a short time period, and has had no reaction.    Juniper Oil Rash   Penicillins Rash   Patient received ancef on 12/20/19 with no adverse reaction.      Medication List    STOP taking these medications   ibuprofen 200 MG tablet Commonly known as: ADVIL     TAKE these medications   albuterol (2.5 MG/3ML) 0.083% nebulizer solution Commonly known as: PROVENTIL Take 2.5 mg by nebulization every 4 (four) hours as needed for wheezing or shortness of breath.   albuterol 108 (90 Base) MCG/ACT inhaler Commonly known  as: ProAir HFA Inhale 2 puffs into the lungs as needed (Shortness of breath).   ARIPiprazole 5 MG tablet Commonly known as: ABILIFY Take 5 mg by mouth at bedtime.   aspirin EC 325 MG tablet Take 1 tablet (325 mg total) by mouth 2 (two) times daily.   baclofen 10 MG tablet Commonly known as: LIORESAL Take 1 tablet (10 mg total) by mouth 3 (three) times daily. As needed for muscle spasm   budesonide-formoterol 160-4.5 MCG/ACT inhaler Commonly known as: Symbicort Inhale 2 puffs into the lungs 2 (two) times daily.   calcitRIOL 0.25 MCG capsule Commonly known as: ROCALTROL Take 0.25 mcg by mouth daily.   CALTRATE 600+D PO Take 500 mg by mouth 2 (two) times daily.   cetirizine 10 MG tablet Commonly known as: ZYRTEC Take 10 mg by mouth daily.   HYDROcodone-acetaminophen 10-325 MG  tablet Commonly known as: Norco Take 1 tablet by mouth every 6 (six) hours as needed.   levothyroxine 100 MCG tablet Commonly known as: SYNTHROID Take 100 mcg by mouth daily before breakfast.   losartan 100 MG tablet Commonly known as: COZAAR Take 100 mg by mouth daily.   montelukast 10 MG tablet Commonly known as: SINGULAIR Take 10 mg by mouth at bedtime.   ondansetron 4 MG tablet Commonly known as: Zofran Take 1 tablet (4 mg total) by mouth every 8 (eight) hours as needed for nausea or vomiting.   PARoxetine 20 MG tablet Commonly known as: PAXIL Take 20 mg by mouth at bedtime.   sennosides-docusate sodium 8.6-50 MG tablet Commonly known as: SENOKOT-S Take 2 tablets by mouth daily.       Diagnostic Studies: DG Knee Right Port  Result Date: 12/20/2019 CLINICAL DATA:  Post RIGHT total knee arthroplasty EXAM: PORTABLE RIGHT KNEE - 1-2 VIEW COMPARISON:  Portable exam 1047 hours compared to 09/05/2019 FINDINGS: Interval placement of components of a RIGHT knee prosthesis. Mild diffuse osseous demineralization. No acute fracture, dislocation, or bone destruction. No periprosthetic lucency. IMPRESSION: RIGHT knee prosthesis without acute complication. Electronically Signed   By: Lavonia Dana M.D.   On: 12/20/2019 11:25    Disposition: Discharge disposition: 01-Home or Self Care         Follow-up Information    Marchia Bond, MD. Go on 01/02/2020.   Specialty: Orthopedic Surgery Why: your appointment has been scheduled for 9:30 am.  Contact information: [address] [address] [phone]        [facility].   Specialty: [place] Why: HHPT will see you at home for 5 visits prior to starting outpatient physical therapy  Contact information: [address] [place] [address] [phone]        [facility], [place]. Go on 01/02/2020.   Why: Your outpatient physical therapy  appointment is scheduled for 11:00. Please arrive by 10:30 to complete your paperwork  Contact information: [facility] [place] [address] [phone]            Signed: Jola Baptist 12/21/2019, 9:54 AM

## 2019-12-21 NOTE — Progress Notes (Signed)
Pt c/o dizziness after PT session, alert and oriented x 4. No changes in the mental status. Vitals were taken, BP is soft 91/57. PA is paged and notified. New orders received. Pt not in distress.

## 2019-12-21 NOTE — TOC Transition Note (Signed)
Transition of Care [facility]) - CM/SW Discharge Note   Patient Details  Name: Becky Davis MRN: [id number] Date of Birth: [birth year]  Transition of [facility]) CM/SW Contact:  Lennart Pall, LCSW Phone Number: 12/21/2019, 11:07 AM   Clinical Narrative:   Pt is Ortho Bundle.  Met briefly to confirm pt has received DME and aware [place] arrangements.  NO TOC  Needs.    Final next level of care: [place] Barriers to Discharge: Continued Medical Work up   Patient Goals and CMS Choice        Discharge Placement                       Discharge Plan and Services                DME Arranged: Walker rolling, 3-N-1 DME Agency: [employer]       HH Arranged: PT [place] Agency: [facility])        Social Determinants of Health (SDOH) Interventions     Readmission Risk Interventions No flowsheet data found.

## 2019-12-22 DIAGNOSIS — I10 Essential (primary) hypertension: Secondary | ICD-10-CM | POA: Diagnosis not present

## 2019-12-22 DIAGNOSIS — Z7951 Long term (current) use of inhaled steroids: Secondary | ICD-10-CM | POA: Diagnosis not present

## 2019-12-22 DIAGNOSIS — R7303 Prediabetes: Secondary | ICD-10-CM | POA: Diagnosis not present

## 2019-12-22 DIAGNOSIS — E039 Hypothyroidism, unspecified: Secondary | ICD-10-CM | POA: Diagnosis not present

## 2019-12-22 DIAGNOSIS — Z79899 Other long term (current) drug therapy: Secondary | ICD-10-CM | POA: Diagnosis not present

## 2019-12-22 DIAGNOSIS — M1711 Unilateral primary osteoarthritis, right knee: Secondary | ICD-10-CM | POA: Diagnosis not present

## 2019-12-22 LAB — CBC
HCT: 31 % — ABNORMAL LOW (ref 36.0–46.0)
Hemoglobin: 10.4 g/dL — ABNORMAL LOW (ref 12.0–15.0)
MCH: 29.8 pg (ref 26.0–34.0)
MCHC: 33.5 g/dL (ref 30.0–36.0)
MCV: 88.8 fL (ref 80.0–100.0)
Platelets: 209 10*3/uL (ref 150–400)
RBC: 3.49 MIL/uL — ABNORMAL LOW (ref 3.87–5.11)
RDW: 13.2 % (ref 11.5–15.5)
WBC: 14 10*3/uL — ABNORMAL HIGH (ref 4.0–10.5)
nRBC: 0 % (ref 0.0–0.2)

## 2019-12-22 NOTE — Discharge Summary (Addendum)
Discharge Summary  Patient ID: Becky Davis MRN: [id number] DOB/AGE: 1951-06-22 69 y.o.  Admit date: 12/20/2019 Discharge date: 12/22/2019  Admission Diagnoses:  Osteoarthritis of right knee  Discharge Diagnoses:  Principal Problem:   Osteoarthritis of right knee Active Problems:   S/P TKR (total knee replacement), right   Past Medical History:  Diagnosis Date  . Arthritis    knees  . Asthma   . Headache    hx of 20 years ago  . Heart murmur    hx of 10-15 years ago  . Hypertension   . Hypothyroidism   . Pneumonia   . Pre-diabetes   . Thyroid nodule     Surgeries: Procedure(s): TOTAL KNEE ARTHROPLASTY on 12/20/2019   Consultants (if any):   Discharged Condition: Improved  Hospital Course: SONDA COPPENS is an 69 y.o. female who was admitted 12/20/2019 with a diagnosis of Osteoarthritis of right knee and went to the operating room on 12/20/2019 and underwent the above named procedures. She was kept a second night due to dizziness upon walking and low blood pressures. This resolved and she continued to excel with physical therapy.    She was given perioperative antibiotics:  Anti-infectives (From admission, onward)   Start     Dose/Rate Route Frequency Ordered Stop   12/20/19 1401  ceFAZolin (ANCEF) 2-4 GM/100ML-% IVPB       Note to Pharmacy: Marylyn Ishihara   : cabinet override      12/20/19 1401 12/21/19 0214   12/20/19 1400  ceFAZolin (ANCEF) IVPB 2g/100 mL premix        2 g 200 mL/hr over 30 Minutes Intravenous Every 6 hours 12/20/19 1155 12/20/19 2120   12/20/19 0600  ceFAZolin (ANCEF) IVPB 2g/100 mL premix        2 g 200 mL/hr over 30 Minutes Intravenous On call to O.R. 12/20/19 7741 12/20/19 0750    .  She was given sequential compression devices, early ambulation, and aspirin for DVT prophylaxis.  She benefited maximally from the hospital stay and there were no complications.    Recent vital signs:  Vitals:   12/22/19 1003 12/22/19 1032  BP: (!) 157/70  (!) 154/81  Pulse: 77 68  Resp:    Temp:    SpO2:      Recent laboratory studies:  Lab Results  Component Value Date   HGB 10.4 (L) 12/22/2019   HGB 10.6 (L) 12/21/2019   HGB 12.4 12/13/2019   Lab Results  Component Value Date   WBC 14.0 (H) 12/22/2019   PLT 209 12/22/2019   No results found for: INR Lab Results  Component Value Date   NA 133 (L) 12/21/2019   K 4.0 12/21/2019   CL 102 12/21/2019   CO2 23 12/21/2019   BUN 19 12/21/2019   CREATININE 0.73 12/21/2019   GLUCOSE 150 (H) 12/21/2019    Discharge Medications:   Allergies as of 12/22/2019      Reactions   Prednisone    Had depression in 1991 after extended use of predisone. Pt has taken this medication since in a short time period, and has had no reaction.    Juniper Oil Rash   Penicillins Rash   Patient received ancef on 12/20/19 with no adverse reaction.      Medication List    STOP taking these medications   ibuprofen 200 MG tablet Commonly known as: ADVIL     TAKE these medications   albuterol (2.5 MG/3ML) 0.083% nebulizer solution Commonly known  as: PROVENTIL Take 2.5 mg by nebulization every 4 (four) hours as needed for wheezing or shortness of breath.   albuterol 108 (90 Base) MCG/ACT inhaler Commonly known as: ProAir HFA Inhale 2 puffs into the lungs as needed (Shortness of breath).   ARIPiprazole 5 MG tablet Commonly known as: ABILIFY Take 5 mg by mouth at bedtime.   aspirin EC 325 MG tablet Take 1 tablet (325 mg total) by mouth 2 (two) times daily.   baclofen 10 MG tablet Commonly known as: LIORESAL Take 1 tablet (10 mg total) by mouth 3 (three) times daily. As needed for muscle spasm   budesonide-formoterol 160-4.5 MCG/ACT inhaler Commonly known as: Symbicort Inhale 2 puffs into the lungs 2 (two) times daily.   calcitRIOL 0.25 MCG capsule Commonly known as: ROCALTROL Take 0.25 mcg by mouth daily.   CALTRATE 600+D PO Take 500 mg by mouth 2 (two) times daily.   cetirizine 10  MG tablet Commonly known as: ZYRTEC Take 10 mg by mouth daily.   HYDROcodone-acetaminophen 10-325 MG tablet Commonly known as: Norco Take 1 tablet by mouth every 6 (six) hours as needed.   levothyroxine 100 MCG tablet Commonly known as: SYNTHROID Take 100 mcg by mouth daily before breakfast.   losartan 100 MG tablet Commonly known as: COZAAR Take 0.5 tablets (50 mg total) by mouth daily. What changed: how much to take   montelukast 10 MG tablet Commonly known as: SINGULAIR Take 10 mg by mouth at bedtime.   ondansetron 4 MG tablet Commonly known as: Zofran Take 1 tablet (4 mg total) by mouth every 8 (eight) hours as needed for nausea or vomiting.   PARoxetine 20 MG tablet Commonly known as: PAXIL Take 20 mg by mouth at bedtime.   sennosides-docusate sodium 8.6-50 MG tablet Commonly known as: SENOKOT-S Take 2 tablets by mouth daily.       Diagnostic Studies: DG Knee Right Port  Result Date: 12/20/2019 CLINICAL DATA:  Post RIGHT total knee arthroplasty EXAM: PORTABLE RIGHT KNEE - 1-2 VIEW COMPARISON:  Portable exam 1047 hours compared to 09/05/2019 FINDINGS: Interval placement of components of a RIGHT knee prosthesis. Mild diffuse osseous demineralization. No acute fracture, dislocation, or bone destruction. No periprosthetic lucency. IMPRESSION: RIGHT knee prosthesis without acute complication. Electronically Signed   By: Lavonia Dana M.D.   On: 12/20/2019 11:25    Disposition: Discharge disposition: 01-Home or Self Care          Follow-up Information    Marchia Bond, MD. Go on 01/02/2020.   Specialty: Orthopedic Surgery Why: your appointment has been scheduled for 9:30 am.  Contact information: [address] [place] [address] [phone]        [facility] Follow up.   Specialty: [place] Why: HHPT will see you at home for 5 visits prior to starting outpatient physical therapy  Contact information: [address] [place] [address] [phone]        [facility], [place]. Go on 01/02/2020.   Why: Your outpatient physical therapy appointment is scheduled for 11:00. Please arrive by 10:30 to complete your paperwork  Contact information: [facility] [place] [address] [phone]        Nigel Mormon, MD. Schedule an appointment as soon as possible for a visit.   Specialties: Cardiology, Radiology Contact information: [address] [place]  [address] [phone]  Signed: Ventura Bruns PA-C 12/22/2019, 1:01 PM

## 2019-12-22 NOTE — Progress Notes (Signed)
Physical Therapy Treatment Patient Details Name: TERRIAH REGGIO MRN: [id number] DOB: 1950-10-24 Today's Date: 12/22/2019    History of Present Illness Pt s/p R TKR    PT Comments    Pt ambulated in hallway, practiced safe stair technique, and performed LE exercises.  Pt had no dizziness during session.  Pt had no further questions and is ready for d/c home.    Follow Up Recommendations  Home health PT     Equipment Recommendations  Rolling walker with 5" wheels;3in1 (PT)    Recommendations for Other Services       Precautions / Restrictions Precautions Precautions: Fall;Knee Restrictions Other Position/Activity Restrictions: WBAT    Mobility  Bed Mobility Overal bed mobility: Needs Assistance Bed Mobility: Supine to Sit     Supine to sit: Supervision        Transfers Overall transfer level: Needs assistance Equipment used: Rolling walker (2 wheeled) Transfers: Sit to/from Stand Sit to Stand: Min guard         General transfer comment: min/guard for safety  Ambulation/Gait Ambulation/Gait assistance: Min guard Gait Distance (Feet): 60 Feet Assistive device: Rolling walker (2 wheeled) Gait Pattern/deviations: Step-through pattern;Antalgic;Decreased stance time - right Gait velocity: decr   General Gait Details: cues for posture, position from RW and sequence; no dizziness   Stairs Stairs: Yes Stairs assistance: Min guard Stair Management: Step to pattern;Forwards;Two rails Number of Stairs: 2 General stair comments: verbal cues for safety and sequence; performed twice   Wheelchair Mobility    Modified Rankin (Stroke Patients Only)       Balance                                            Cognition Arousal/Alertness: Awake/alert Behavior During Therapy: WFL for tasks assessed/performed Overall Cognitive Status: Within Functional Limits for tasks assessed                                         Exercises Total Joint Exercises Ankle Circles/Pumps: AROM;10 reps;Both Quad Sets: AROM;Both;10 reps Short Arc Quad: AROM;Right;10 reps Heel Slides: AAROM;Right;10 reps Hip ABduction/ADduction: Right;10 reps;AAROM Straight Leg Raises: AAROM;Right;10 reps    General Comments        Pertinent Vitals/Pain Pain Assessment: 0-10 Pain Score: 6  Pain Location: R knee Pain Descriptors / Indicators: Aching;Sore Pain Intervention(s): Monitored during session;Repositioned;Ice applied    Home Living                      Prior Function            PT Goals (current goals can now be found in the care plan section) Progress towards PT goals: Progressing toward goals    Frequency    7X/week      PT Plan Current plan remains appropriate    Co-evaluation              AM-PAC PT "6 Clicks" Mobility   Outcome Measure  Help needed turning from your back to your side while in a flat bed without using bedrails?: A Little Help needed moving from lying on your back to sitting on the side of a flat bed without using bedrails?: A Little Help needed moving to and from a bed to a chair (including a wheelchair)?:  A Little Help needed standing up from a chair using your arms (e.g., wheelchair or bedside chair)?: A Little Help needed to walk in hospital room?: A Little Help needed climbing 3-5 steps with a railing? : A Little 6 Click Score: 18    End of Session Equipment Utilized During Treatment: Gait belt Activity Tolerance: Patient tolerated treatment well Patient left: with call bell/phone within reach;in chair Nurse Communication: Mobility status PT Visit Diagnosis: Difficulty in walking, not elsewhere classified (R26.2)     Time: 9323-5573 PT Time Calculation (min) (ACUTE ONLY): 20 min  Charges:  $Gait Training: 8-22 mins                     Arlyce Dice, DPT Acute Rehabilitation Services Pager: [phone] Office: [phone]  Trena Platt 12/22/2019,  12:39 PM

## 2019-12-22 NOTE — Progress Notes (Signed)
° ° ° °  Subjective: 2 Days Post-Op s/p Procedure(s): TOTAL KNEE ARTHROPLASTY   Patient is laying comfortably in bed. States "her blood pressure was all of the place yesterday" but has so far not felt dizzy this morning. States pain in right knee is a 3/10 and has largely been well controlled.    Objective:  PE: VITALS:   Vitals:   12/21/19 1317 12/21/19 1346 12/21/19 2114 12/22/19 0549  BP: 103/78 137/66 (!) 149/76 (!) 159/86  Pulse: 77 73 73 74  Resp:   17 17  Temp:   98.1 F (36.7 C) 99 F (37.2 C)  TempSrc:   Oral Oral  SpO2:  96% 98% 95%  Weight:      Height:       General: laying in bed, in no acute distress Resp: No use of accessory musculature Neurovascular intact Sensation intact distally Intact pulses distally Dorsiflexion/Plantar flexion intact Incision: aquacell in place, scant drainage  LABS  Results for orders placed or performed during the hospital encounter of 12/20/19 (from the past 24 hour(s))  CBC     Status: Abnormal   Collection Time: 12/22/19  3:46 AM  Result Value Ref Range   WBC 14.0 (H) 4.0 - 10.5 K/uL   RBC 3.49 (L) 3.87 - 5.11 MIL/uL   Hemoglobin 10.4 (L) 12.0 - 15.0 g/dL   HCT 31.0 (L) 36 - 46 %   MCV 88.8 80.0 - 100.0 fL   MCH 29.8 26.0 - 34.0 pg   MCHC 33.5 30.0 - 36.0 g/dL   RDW 13.2 11.5 - 15.5 %   Platelets 209 150 - 400 K/uL   nRBC 0.0 0.0 - 0.2 %    DG Knee Right Port  Result Date: 12/20/2019 CLINICAL DATA:  Post RIGHT total knee arthroplasty EXAM: PORTABLE RIGHT KNEE - 1-2 VIEW COMPARISON:  Portable exam 1047 hours compared to 09/05/2019 FINDINGS: Interval placement of components of a RIGHT knee prosthesis. Mild diffuse osseous demineralization. No acute fracture, dislocation, or bone destruction. No periprosthetic lucency. IMPRESSION: RIGHT knee prosthesis without acute complication. Electronically Signed   By: Lavonia Dana M.D.   On: 12/20/2019 11:25    Assessment/Plan: Principal Problem:   Osteoarthritis of right knee Active  Problems:   S/P TKR (total knee replacement), right  Dizziness, history of 2 pre-syncopal episodes - decrease in BP yesterday while working with PT, bolus given and BP improved - Patient asked to been seen by her cardiologist, spoke with cardiologist on call who recommended decreasing losartan dose to 50 mg and close outpatient follow-up - BPs running higher this morning, patient to receive decreased losartan dose this morning  2 Days Post-Op s/p Procedure(s): TOTAL KNEE ARTHROPLASTY  Weightbearing: WBAT RLE, up with therapy Insicional and dressing care: Reinforce dressings as needed, new aquacell placed this morning, ok to leave on at discharge VTE prophylaxis: Aspirin 325mg  BID x 30 days Pain control: continue current regimen, pain so far well controlled Follow - up plan: Follow-up with Dr. Mardelle Matte in 2 weeks Dispo: Patient will perform stair training with PT today, if tolerates well will discharge home  Contact information:   Weekdays 8-5 Merlene Pulling, PA-C [phone] A fter hours and holidays please check [email or web address] for group call information for [facility]  Ventura Bruns 12/22/2019, 7:36 AM

## 2019-12-22 NOTE — Discharge Instructions (Signed)
INSTRUCTIONS AFTER JOINT REPLACEMENT   o Remove items at home which could result in a fall. This includes throw rugs or furniture in walking pathways o ICE to the affected joint every three hours while awake for 30 minutes at a time, for at least the first 3-5 days, and then as needed for pain and swelling.  Continue to use ice for pain and swelling. You may notice swelling that will progress down to the foot and ankle.  This is normal after surgery.  Elevate your leg when you are not up walking on it.   o Continue to use the breathing machine you got in the hospital (incentive spirometer) which will help keep your temperature down.  It is common for your temperature to cycle up and down following surgery, especially at night when you are not up moving around and exerting yourself.  The breathing machine keeps your lungs expanded and your temperature down.   DIET:  As you were doing prior to hospitalization, we recommend a well-balanced diet.  DRESSING / WOUND CARE / SHOWERING  You may change your dressing 3-5 days after surgery.  Then change the dressing every day with sterile gauze.  Please use good hand washing techniques before changing the dressing.  Do not use any lotions or creams on the incision until instructed by your surgeon.  ACTIVITY  o Increase activity slowly as tolerated, but follow the weight bearing instructions below.   o No driving for 6 weeks or until further direction given by your physician.  You cannot drive while taking narcotics.  o No lifting or carrying greater than 10 lbs. until further directed by your surgeon. o Avoid periods of inactivity such as sitting longer than an hour when not asleep. This helps prevent blood clots.  o You may return to work once you are authorized by your doctor.     WEIGHT BEARING   Weight bearing as tolerated with assist device (walker, cane, etc) as directed, use it as long as suggested by your surgeon or therapist, typically at  least 4-6 weeks.   EXERCISES  Results after joint replacement surgery are often greatly improved when you follow the exercise, range of motion and muscle strengthening exercises prescribed by your doctor. Safety measures are also important to protect the joint from further injury. Any time any of these exercises cause you to have increased pain or swelling, decrease what you are doing until you are comfortable again and then slowly increase them. If you have problems or questions, call your caregiver or physical therapist for advice.   Rehabilitation is important following a joint replacement. After just a few days of immobilization, the muscles of the leg can become weakened and shrink (atrophy).  These exercises are designed to build up the tone and strength of the thigh and leg muscles and to improve motion. Often times heat used for twenty to thirty minutes before working out will loosen up your tissues and help with improving the range of motion but do not use heat for the first two weeks following surgery (sometimes heat can increase post-operative swelling).   These exercises can be done on a training (exercise) mat, on the floor, on a table or on a bed. Use whatever works the best and is most comfortable for you.    Use music or television while you are exercising so that the exercises are a pleasant break in your day. This will make your life better with the exercises acting as a break   in your routine that you can look forward to.   Perform all exercises about fifteen times, three times per day or as directed.  You should exercise both the operative leg and the other leg as well.  Exercises include:   . Quad Sets - Tighten up the muscle on the front of the thigh (Quad) and hold for 5-10 seconds.   . Straight Leg Raises - With your knee straight (if you were given a brace, keep it on), lift the leg to 60 degrees, hold for 3 seconds, and slowly lower the leg.  Perform this exercise against  resistance later as your leg gets stronger.  . Leg Slides: Lying on your back, slowly slide your foot toward your buttocks, bending your knee up off the floor (only go as far as is comfortable). Then slowly slide your foot back down until your leg is flat on the floor again.  . Angel Wings: Lying on your back spread your legs to the side as far apart as you can without causing discomfort.  . Hamstring Strength:  Lying on your back, push your heel against the floor with your leg straight by tightening up the muscles of your buttocks.  Repeat, but this time bend your knee to a comfortable angle, and push your heel against the floor.  You may put a pillow under the heel to make it more comfortable if necessary.   A rehabilitation program following joint replacement surgery can speed recovery and prevent re-injury in the future due to weakened muscles. Contact your doctor or a physical therapist for more information on knee rehabilitation.    CONSTIPATION  Constipation is defined medically as fewer than three stools per week and severe constipation as less than one stool per week.  Even if you have a regular bowel pattern at home, your normal regimen is likely to be disrupted due to multiple reasons following surgery.  Combination of anesthesia, postoperative narcotics, change in appetite and fluid intake all can affect your bowels.   YOU MUST use at least one of the following options; they are listed in order of increasing strength to get the job done.  They are all available over the counter, and you may need to use some, POSSIBLY even all of these options:    Drink plenty of fluids (prune juice may be helpful) and high fiber foods Colace 100 mg by mouth twice a day  Senokot for constipation as directed and as needed Dulcolax (bisacodyl), take with full glass of water  Miralax (polyethylene glycol) once or twice a day as needed.  If you have tried all these things and are unable to have a bowel  movement in the first 3-4 days after surgery call either your surgeon or your primary doctor.    If you experience loose stools or diarrhea, hold the medications until you stool forms back up.  If your symptoms do not get better within 1 week or if they get worse, check with your doctor.  If you experience "the worst abdominal pain ever" or develop nausea or vomiting, please contact the office immediately for further recommendations for treatment.   ITCHING:  If you experience itching with your medications, try taking only a single pain pill, or even half a pain pill at a time.  You can also use Benadryl over the counter for itching or also to help with sleep.   TED HOSE STOCKINGS:  Use stockings on both legs until for at least 2 weeks or as   directed by physician office. They may be removed at night for sleeping. ° °MEDICATIONS:  See your medication summary on the “After Visit Summary” that nursing will review with you.  You may have some home medications which will be placed on hold until you complete the course of blood thinner medication.  It is important for you to complete the blood thinner medication as prescribed. ° °PRECAUTIONS:  If you experience chest pain or shortness of breath - call 911 immediately for transfer to the hospital emergency department.  ° °If you develop a fever greater that 101 F, purulent drainage from wound, increased redness or drainage from wound, foul odor from the wound/dressing, or calf pain - CONTACT YOUR SURGEON.   °                                                °FOLLOW-UP APPOINTMENTS:  If you do not already have a post-op appointment, please call the office for an appointment to be seen by your surgeon.  Guidelines for how soon to be seen are listed in your “After Visit Summary”, but are typically between 1-4 weeks after surgery. ° °OTHER INSTRUCTIONS:  ° °Knee Replacement:  Do not place pillow under knee, focus on keeping the knee straight while resting.  ° °DENTAL  ANTIBIOTICS: ° °In most cases prophylactic antibiotics for Dental procdeures after total joint surgery are not necessary. ° °Exceptions are as follows: ° °1. History of prior total joint infection ° °2. Severely immunocompromised (Organ Transplant, cancer chemotherapy, Rheumatoid biologic °meds such as Humera) ° °3. Poorly controlled diabetes (A1C &gt; 8.0, blood glucose over 200) ° °If you have one of these conditions, contact your surgeon for an antibiotic prescription, prior to your °dental procedure. ° ° °MAKE SURE YOU:  °• Understand these instructions.  °• Get help right away if you are not doing well or get worse.  ° ° °Thank you for letting us be a part of your medical care team.  It is a privilege we respect greatly.  We hope these instructions will help you stay on track for a fast and full recovery!  ° °

## 2019-12-23 DIAGNOSIS — I10 Essential (primary) hypertension: Secondary | ICD-10-CM | POA: Diagnosis not present

## 2019-12-23 DIAGNOSIS — Z7951 Long term (current) use of inhaled steroids: Secondary | ICD-10-CM | POA: Diagnosis not present

## 2019-12-23 DIAGNOSIS — Z6834 Body mass index (BMI) 34.0-34.9, adult: Secondary | ICD-10-CM | POA: Diagnosis not present

## 2019-12-23 DIAGNOSIS — Z9181 History of falling: Secondary | ICD-10-CM | POA: Diagnosis not present

## 2019-12-23 DIAGNOSIS — E041 Nontoxic single thyroid nodule: Secondary | ICD-10-CM | POA: Diagnosis not present

## 2019-12-23 DIAGNOSIS — Z96651 Presence of right artificial knee joint: Secondary | ICD-10-CM | POA: Diagnosis not present

## 2019-12-23 DIAGNOSIS — E039 Hypothyroidism, unspecified: Secondary | ICD-10-CM | POA: Diagnosis not present

## 2019-12-23 DIAGNOSIS — R7303 Prediabetes: Secondary | ICD-10-CM | POA: Diagnosis not present

## 2019-12-23 DIAGNOSIS — E876 Hypokalemia: Secondary | ICD-10-CM | POA: Diagnosis not present

## 2019-12-23 DIAGNOSIS — Z471 Aftercare following joint replacement surgery: Secondary | ICD-10-CM | POA: Diagnosis not present

## 2019-12-23 DIAGNOSIS — Z8701 Personal history of pneumonia (recurrent): Secondary | ICD-10-CM | POA: Diagnosis not present

## 2019-12-23 DIAGNOSIS — Z7982 Long term (current) use of aspirin: Secondary | ICD-10-CM | POA: Diagnosis not present

## 2019-12-23 DIAGNOSIS — J449 Chronic obstructive pulmonary disease, unspecified: Secondary | ICD-10-CM | POA: Diagnosis not present

## 2019-12-23 DIAGNOSIS — F329 Major depressive disorder, single episode, unspecified: Secondary | ICD-10-CM | POA: Diagnosis not present

## 2019-12-26 ENCOUNTER — Encounter (HOSPITAL_COMMUNITY): Payer: Self-pay | Admitting: Orthopedic Surgery

## 2019-12-26 DIAGNOSIS — F329 Major depressive disorder, single episode, unspecified: Secondary | ICD-10-CM | POA: Diagnosis not present

## 2019-12-26 DIAGNOSIS — E876 Hypokalemia: Secondary | ICD-10-CM | POA: Diagnosis not present

## 2019-12-26 DIAGNOSIS — R7303 Prediabetes: Secondary | ICD-10-CM | POA: Diagnosis not present

## 2019-12-26 DIAGNOSIS — I10 Essential (primary) hypertension: Secondary | ICD-10-CM | POA: Diagnosis not present

## 2019-12-26 DIAGNOSIS — Z471 Aftercare following joint replacement surgery: Secondary | ICD-10-CM | POA: Diagnosis not present

## 2019-12-26 DIAGNOSIS — J449 Chronic obstructive pulmonary disease, unspecified: Secondary | ICD-10-CM | POA: Diagnosis not present

## 2019-12-27 DIAGNOSIS — J449 Chronic obstructive pulmonary disease, unspecified: Secondary | ICD-10-CM | POA: Diagnosis not present

## 2019-12-27 DIAGNOSIS — Z471 Aftercare following joint replacement surgery: Secondary | ICD-10-CM | POA: Diagnosis not present

## 2019-12-27 DIAGNOSIS — F329 Major depressive disorder, single episode, unspecified: Secondary | ICD-10-CM | POA: Diagnosis not present

## 2019-12-27 DIAGNOSIS — I10 Essential (primary) hypertension: Secondary | ICD-10-CM | POA: Diagnosis not present

## 2019-12-27 DIAGNOSIS — E876 Hypokalemia: Secondary | ICD-10-CM | POA: Diagnosis not present

## 2019-12-27 DIAGNOSIS — R7303 Prediabetes: Secondary | ICD-10-CM | POA: Diagnosis not present

## 2019-12-28 DIAGNOSIS — I10 Essential (primary) hypertension: Secondary | ICD-10-CM | POA: Diagnosis not present

## 2019-12-28 DIAGNOSIS — J449 Chronic obstructive pulmonary disease, unspecified: Secondary | ICD-10-CM | POA: Diagnosis not present

## 2019-12-28 DIAGNOSIS — Z471 Aftercare following joint replacement surgery: Secondary | ICD-10-CM | POA: Diagnosis not present

## 2019-12-28 DIAGNOSIS — R7303 Prediabetes: Secondary | ICD-10-CM | POA: Diagnosis not present

## 2019-12-28 DIAGNOSIS — E876 Hypokalemia: Secondary | ICD-10-CM | POA: Diagnosis not present

## 2019-12-28 DIAGNOSIS — F329 Major depressive disorder, single episode, unspecified: Secondary | ICD-10-CM | POA: Diagnosis not present

## 2019-12-30 DIAGNOSIS — E876 Hypokalemia: Secondary | ICD-10-CM | POA: Diagnosis not present

## 2019-12-30 DIAGNOSIS — F329 Major depressive disorder, single episode, unspecified: Secondary | ICD-10-CM | POA: Diagnosis not present

## 2019-12-30 DIAGNOSIS — I10 Essential (primary) hypertension: Secondary | ICD-10-CM | POA: Diagnosis not present

## 2019-12-30 DIAGNOSIS — J449 Chronic obstructive pulmonary disease, unspecified: Secondary | ICD-10-CM | POA: Diagnosis not present

## 2019-12-30 DIAGNOSIS — R7303 Prediabetes: Secondary | ICD-10-CM | POA: Diagnosis not present

## 2019-12-30 DIAGNOSIS — Z471 Aftercare following joint replacement surgery: Secondary | ICD-10-CM | POA: Diagnosis not present

## 2020-01-05 ENCOUNTER — Ambulatory Visit: Payer: Medicare Other | Admitting: Cardiology

## 2020-01-05 ENCOUNTER — Encounter: Payer: Self-pay | Admitting: Cardiology

## 2020-01-05 ENCOUNTER — Other Ambulatory Visit: Payer: Self-pay

## 2020-01-05 VITALS — BP 142/63 | HR 73 | Ht 63.0 in | Wt 198.0 lb

## 2020-01-05 DIAGNOSIS — I10 Essential (primary) hypertension: Secondary | ICD-10-CM

## 2020-01-05 DIAGNOSIS — Z09 Encounter for follow-up examination after completed treatment for conditions other than malignant neoplasm: Secondary | ICD-10-CM

## 2020-01-05 NOTE — Progress Notes (Signed)
Patient referred by [name], MD for evaluation of presyncope  Subjective:   Becky Davis, female    DOB: 1951-04-24, 69 y.o.   MRN: [id number]   Chief Complaint  Patient presents with   Hypotension   Hospitalization Follow-up    HPI  69 y/o Caucasian female with hypertension. OSA, h/o thyroidectomy, moderate persistent asthma, h/o syncope.  Patient recently underwent rigt knee surgery. During recovery, she had an episode of dizziness and hypotension. This has now completely resolved. She denies chest pain, shortness of breath, palpitations, leg edema, orthopnea, PND, TIA/syncope.   Current Outpatient Medications on File Prior to Visit  Medication Sig Dispense Refill   albuterol (PROAIR HFA) 108 (90 Base) MCG/ACT inhaler Inhale 2 puffs into the lungs as needed (Shortness of breath). 6.7 g 3   albuterol (PROVENTIL) (2.5 MG/3ML) 0.083% nebulizer solution Take 2.5 mg by nebulization every 4 (four) hours as needed for wheezing or shortness of breath.     ARIPiprazole (ABILIFY) 5 MG tablet Take 5 mg by mouth at bedtime.      aspirin EC 325 MG tablet Take 1 tablet (325 mg total) by mouth 2 (two) times daily. 60 tablet 0   baclofen (LIORESAL) 10 MG tablet Take 1 tablet (10 mg total) by mouth 3 (three) times daily. As needed for muscle spasm (Patient taking differently: Take 10 mg by mouth 2 (two) times daily. As needed for muscle spasm) 50 tablet 0   budesonide-formoterol (SYMBICORT) 160-4.5 MCG/ACT inhaler Inhale 2 puffs into the lungs 2 (two) times daily. 1 Inhaler 6   calcitRIOL (ROCALTROL) 0.25 MCG capsule Take 0.25 mcg by mouth daily.     Calcium Carbonate-Vitamin D (CALTRATE 600+D PO) Take 500 mg by mouth 2 (two) times daily.      cetirizine (ZYRTEC) 10 MG tablet Take 10 mg by mouth daily.     HYDROcodone-acetaminophen (NORCO) 10-325 MG tablet Take 1 tablet by mouth every 6 (six) hours as needed. 28 tablet 0   levothyroxine (SYNTHROID, LEVOTHROID) 100 MCG  tablet Take 100 mcg by mouth daily before breakfast.     losartan (COZAAR) 100 MG tablet Take 0.5 tablets (50 mg total) by mouth daily. 5 tablet 0   montelukast (SINGULAIR) 10 MG tablet Take 10 mg by mouth at bedtime.     PARoxetine (PAXIL) 20 MG tablet Take 20 mg by mouth at bedtime.      sennosides-docusate sodium (SENOKOT-S) 8.6-50 MG tablet Take 2 tablets by mouth daily. 30 tablet 1   No current facility-administered medications on file prior to visit.    Cardiovascular studies:  EKG 01/05/2020: Sinus rhythm 68 bpm Low voltage in precordial leads.  Left axis deviation Poor R-wave progression  Echocardiogram 09/20/2018: Left ventricle cavity is normal in size. Normal left ventricular shape. Normal global wall motion. Doppler evidence of grade I (impaired) diastolic dysfunction, normal LAP. Calculated EF 69%. Left atrial cavity is moderately dilated. Mild (Grade I) aortic regurgitation. Mild (Grade I) mitral regurgitation. Mild tricuspid regurgitation. Estimated pulmonary artery systolic pressure 35 mmHg.  EKG 09/28/2018: Sinus bradycardia Cannot rule out Anterior infarct , age undetermined Abnormal ECG.  CT Angio Chest 07/27/2017: 1. No evidence of pulmonary embolism. 2. Moderate bronchial wall thickening indicating asthma and/or bronchitis. No acute cardiopulmonary disease otherwise.   Recent labs: June 2021: Glucose 150, BUN/Cr 19/0.73. EGFR >60. Na/K 133/4.0.  H/H 10.4/31.0. MCV 88. Platelets 209 HbA1C 5.8% TSH 2.5 normal  Review of Systems  Cardiovascular: Negative for chest pain, dyspnea on  exertion, leg swelling, palpitations and syncope.          Vitals:   01/05/20 1110  BP: (!) 142/63  Pulse: 73  SpO2: 97%    Objective:   Physical Exam Vitals and nursing note reviewed.  Constitutional:      General: She is not in acute distress. Neck:     Vascular: No JVD.  Cardiovascular:     Rate and Rhythm: Normal rate and regular rhythm.     Pulses:  Intact distal pulses.     Heart sounds: Normal heart sounds. No murmur heard.   Pulmonary:     Effort: Pulmonary effort is normal.     Breath sounds: Normal breath sounds. No wheezing or rales.   '        Assessment & Recommendations:    69 y/o Caucasian female with hypertension. OSA, h/o thyroidectomy, moderate persistent asthma, h/o syncope.  Recent postop hypotension likely related to pain medication. Blood pressure fairly well controlled. Now completely resolve.d No further cardiac workup necessary at this time.   I will see her on as needed basis.   Nigel Mormon, MD [facility] Pager: [phone] Office: [phone] If no answer Cell [phone]

## 2020-01-10 DIAGNOSIS — J45909 Unspecified asthma, uncomplicated: Secondary | ICD-10-CM | POA: Diagnosis not present

## 2020-01-10 DIAGNOSIS — M1990 Unspecified osteoarthritis, unspecified site: Secondary | ICD-10-CM | POA: Diagnosis not present

## 2020-01-10 DIAGNOSIS — I10 Essential (primary) hypertension: Secondary | ICD-10-CM | POA: Diagnosis not present

## 2020-01-10 DIAGNOSIS — E782 Mixed hyperlipidemia: Secondary | ICD-10-CM | POA: Diagnosis not present

## 2020-01-10 DIAGNOSIS — E039 Hypothyroidism, unspecified: Secondary | ICD-10-CM | POA: Diagnosis not present

## 2020-01-10 DIAGNOSIS — M1711 Unilateral primary osteoarthritis, right knee: Secondary | ICD-10-CM | POA: Diagnosis not present

## 2020-01-11 DIAGNOSIS — M1711 Unilateral primary osteoarthritis, right knee: Secondary | ICD-10-CM | POA: Diagnosis not present

## 2020-01-11 DIAGNOSIS — R262 Difficulty in walking, not elsewhere classified: Secondary | ICD-10-CM | POA: Diagnosis not present

## 2020-01-11 DIAGNOSIS — M6281 Muscle weakness (generalized): Secondary | ICD-10-CM | POA: Diagnosis not present

## 2020-01-12 DIAGNOSIS — E7841 Elevated Lipoprotein(a): Secondary | ICD-10-CM | POA: Diagnosis not present

## 2020-01-12 DIAGNOSIS — E782 Mixed hyperlipidemia: Secondary | ICD-10-CM | POA: Diagnosis not present

## 2020-01-12 DIAGNOSIS — J45909 Unspecified asthma, uncomplicated: Secondary | ICD-10-CM | POA: Diagnosis not present

## 2020-01-12 DIAGNOSIS — M1711 Unilateral primary osteoarthritis, right knee: Secondary | ICD-10-CM | POA: Diagnosis not present

## 2020-01-12 DIAGNOSIS — I10 Essential (primary) hypertension: Secondary | ICD-10-CM | POA: Diagnosis not present

## 2020-01-12 DIAGNOSIS — E039 Hypothyroidism, unspecified: Secondary | ICD-10-CM | POA: Diagnosis not present

## 2020-01-12 DIAGNOSIS — M1990 Unspecified osteoarthritis, unspecified site: Secondary | ICD-10-CM | POA: Diagnosis not present

## 2020-01-17 DIAGNOSIS — M6281 Muscle weakness (generalized): Secondary | ICD-10-CM | POA: Diagnosis not present

## 2020-01-17 DIAGNOSIS — M1711 Unilateral primary osteoarthritis, right knee: Secondary | ICD-10-CM | POA: Diagnosis not present

## 2020-01-17 DIAGNOSIS — R262 Difficulty in walking, not elsewhere classified: Secondary | ICD-10-CM | POA: Diagnosis not present

## 2020-01-23 DIAGNOSIS — M1711 Unilateral primary osteoarthritis, right knee: Secondary | ICD-10-CM | POA: Diagnosis not present

## 2020-01-23 DIAGNOSIS — R262 Difficulty in walking, not elsewhere classified: Secondary | ICD-10-CM | POA: Diagnosis not present

## 2020-01-23 DIAGNOSIS — M6281 Muscle weakness (generalized): Secondary | ICD-10-CM | POA: Diagnosis not present

## 2020-01-25 DIAGNOSIS — M6281 Muscle weakness (generalized): Secondary | ICD-10-CM | POA: Diagnosis not present

## 2020-01-25 DIAGNOSIS — M1711 Unilateral primary osteoarthritis, right knee: Secondary | ICD-10-CM | POA: Diagnosis not present

## 2020-01-25 DIAGNOSIS — R262 Difficulty in walking, not elsewhere classified: Secondary | ICD-10-CM | POA: Diagnosis not present

## 2020-01-25 DIAGNOSIS — J3089 Other allergic rhinitis: Secondary | ICD-10-CM | POA: Diagnosis not present

## 2020-01-25 DIAGNOSIS — J301 Allergic rhinitis due to pollen: Secondary | ICD-10-CM | POA: Diagnosis not present

## 2020-01-30 DIAGNOSIS — R262 Difficulty in walking, not elsewhere classified: Secondary | ICD-10-CM | POA: Diagnosis not present

## 2020-01-30 DIAGNOSIS — M1711 Unilateral primary osteoarthritis, right knee: Secondary | ICD-10-CM | POA: Diagnosis not present

## 2020-01-30 DIAGNOSIS — M6281 Muscle weakness (generalized): Secondary | ICD-10-CM | POA: Diagnosis not present

## 2020-02-01 DIAGNOSIS — M1711 Unilateral primary osteoarthritis, right knee: Secondary | ICD-10-CM | POA: Diagnosis not present

## 2020-02-02 DIAGNOSIS — M6281 Muscle weakness (generalized): Secondary | ICD-10-CM | POA: Diagnosis not present

## 2020-02-02 DIAGNOSIS — R262 Difficulty in walking, not elsewhere classified: Secondary | ICD-10-CM | POA: Diagnosis not present

## 2020-02-02 DIAGNOSIS — M1711 Unilateral primary osteoarthritis, right knee: Secondary | ICD-10-CM | POA: Diagnosis not present

## 2020-02-07 DIAGNOSIS — J3089 Other allergic rhinitis: Secondary | ICD-10-CM | POA: Diagnosis not present

## 2020-02-07 DIAGNOSIS — J301 Allergic rhinitis due to pollen: Secondary | ICD-10-CM | POA: Diagnosis not present

## 2020-02-08 DIAGNOSIS — M1711 Unilateral primary osteoarthritis, right knee: Secondary | ICD-10-CM | POA: Diagnosis not present

## 2020-02-08 DIAGNOSIS — M6281 Muscle weakness (generalized): Secondary | ICD-10-CM | POA: Diagnosis not present

## 2020-02-08 DIAGNOSIS — R262 Difficulty in walking, not elsewhere classified: Secondary | ICD-10-CM | POA: Diagnosis not present

## 2020-02-23 DIAGNOSIS — K219 Gastro-esophageal reflux disease without esophagitis: Secondary | ICD-10-CM | POA: Diagnosis not present

## 2020-02-23 DIAGNOSIS — M1990 Unspecified osteoarthritis, unspecified site: Secondary | ICD-10-CM | POA: Diagnosis not present

## 2020-02-23 DIAGNOSIS — J301 Allergic rhinitis due to pollen: Secondary | ICD-10-CM | POA: Diagnosis not present

## 2020-02-23 DIAGNOSIS — E782 Mixed hyperlipidemia: Secondary | ICD-10-CM | POA: Diagnosis not present

## 2020-02-23 DIAGNOSIS — J45909 Unspecified asthma, uncomplicated: Secondary | ICD-10-CM | POA: Diagnosis not present

## 2020-02-23 DIAGNOSIS — I10 Essential (primary) hypertension: Secondary | ICD-10-CM | POA: Diagnosis not present

## 2020-02-23 DIAGNOSIS — J3089 Other allergic rhinitis: Secondary | ICD-10-CM | POA: Diagnosis not present

## 2020-02-23 DIAGNOSIS — M1711 Unilateral primary osteoarthritis, right knee: Secondary | ICD-10-CM | POA: Diagnosis not present

## 2020-02-23 DIAGNOSIS — E039 Hypothyroidism, unspecified: Secondary | ICD-10-CM | POA: Diagnosis not present

## 2020-03-07 DIAGNOSIS — M1711 Unilateral primary osteoarthritis, right knee: Secondary | ICD-10-CM | POA: Diagnosis not present

## 2020-03-07 DIAGNOSIS — J3089 Other allergic rhinitis: Secondary | ICD-10-CM | POA: Diagnosis not present

## 2020-03-07 DIAGNOSIS — J301 Allergic rhinitis due to pollen: Secondary | ICD-10-CM | POA: Diagnosis not present

## 2020-03-20 DIAGNOSIS — J3089 Other allergic rhinitis: Secondary | ICD-10-CM | POA: Diagnosis not present

## 2020-03-20 DIAGNOSIS — J301 Allergic rhinitis due to pollen: Secondary | ICD-10-CM | POA: Diagnosis not present

## 2020-03-29 DIAGNOSIS — K1123 Chronic sialoadenitis: Secondary | ICD-10-CM | POA: Diagnosis not present

## 2020-03-29 DIAGNOSIS — K115 Sialolithiasis: Secondary | ICD-10-CM | POA: Diagnosis not present

## 2020-04-02 ENCOUNTER — Other Ambulatory Visit: Payer: Self-pay | Admitting: Otolaryngology

## 2020-04-02 DIAGNOSIS — K115 Sialolithiasis: Secondary | ICD-10-CM

## 2020-04-04 DIAGNOSIS — G4733 Obstructive sleep apnea (adult) (pediatric): Secondary | ICD-10-CM | POA: Diagnosis not present

## 2020-04-04 DIAGNOSIS — J301 Allergic rhinitis due to pollen: Secondary | ICD-10-CM | POA: Diagnosis not present

## 2020-04-04 DIAGNOSIS — J3089 Other allergic rhinitis: Secondary | ICD-10-CM | POA: Diagnosis not present

## 2020-04-05 DIAGNOSIS — Z23 Encounter for immunization: Secondary | ICD-10-CM | POA: Diagnosis not present

## 2020-04-06 DIAGNOSIS — J3089 Other allergic rhinitis: Secondary | ICD-10-CM | POA: Diagnosis not present

## 2020-04-06 DIAGNOSIS — J301 Allergic rhinitis due to pollen: Secondary | ICD-10-CM | POA: Diagnosis not present

## 2020-04-11 IMAGING — US US THYROID
1 series · 13 of 25 positions shown · non-contrast
Comparison: 04/22/2017;

CLINICAL DATA: Prior ultrasound follow-up. Post left thyroid
lobectomy.

EXAM:
THYROID ULTRASOUND
TECHNIQUE: Ultrasound examination of the thyroid gland and adjacent soft
tissues was performed.

[Series 1: us thyroid · 0.05mm/px · 13 of 29 slices shown]
[im 1/29]
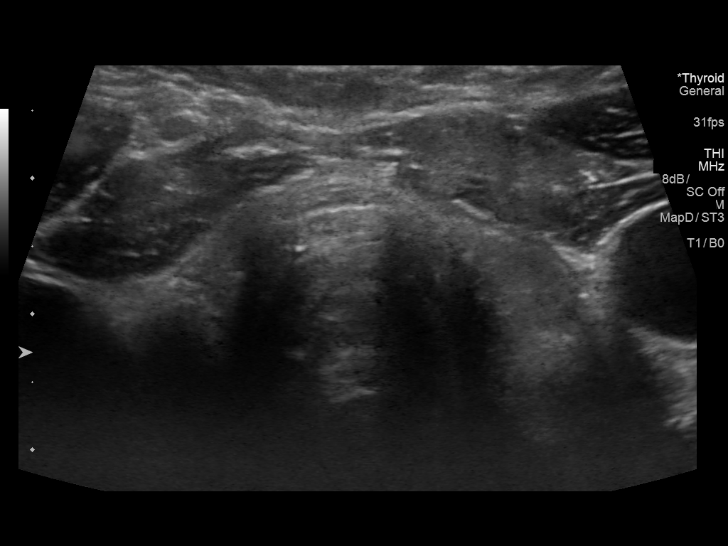
[im 3/29]
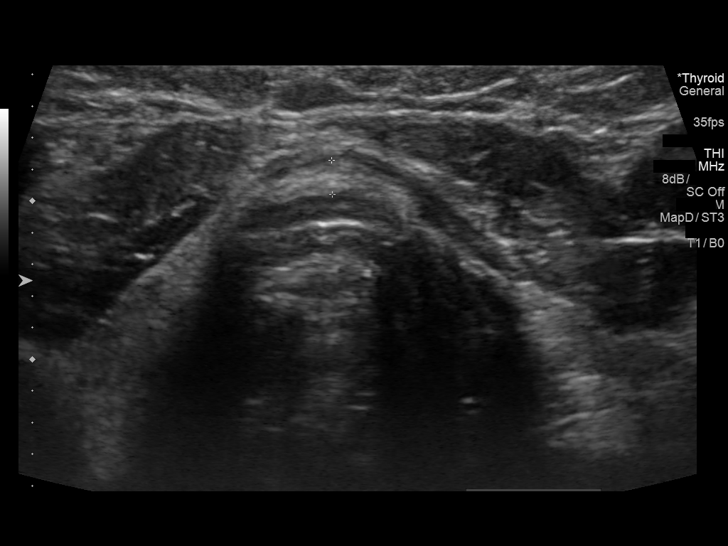
[im 5/29]
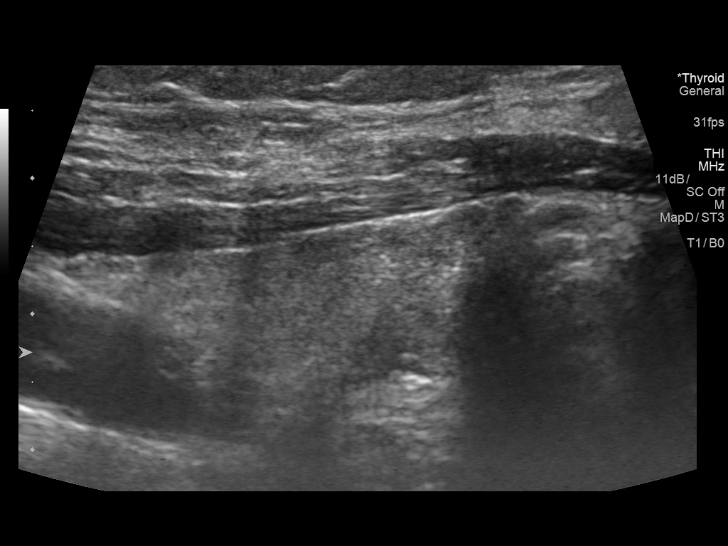
[im 8/29]
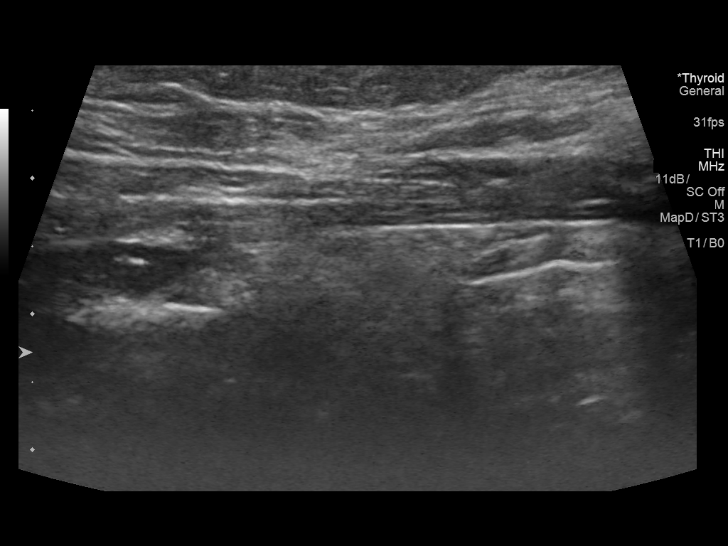
[im 10/29]
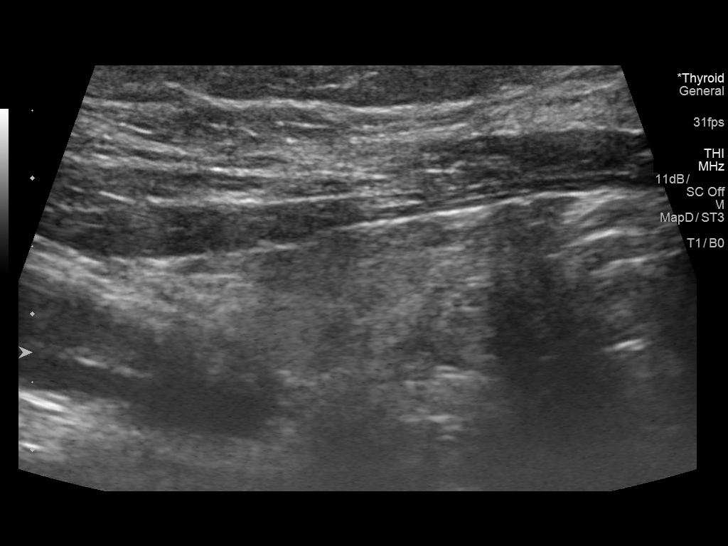
[im 12/29]
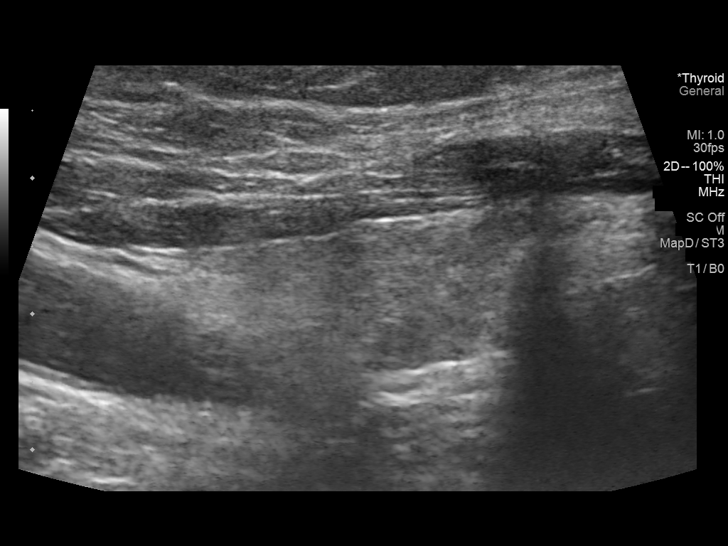
[im 15/29]
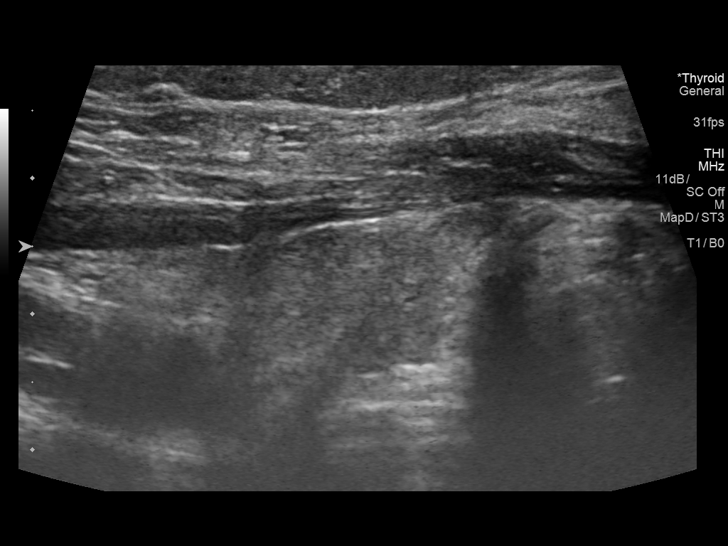
[im 17/29]
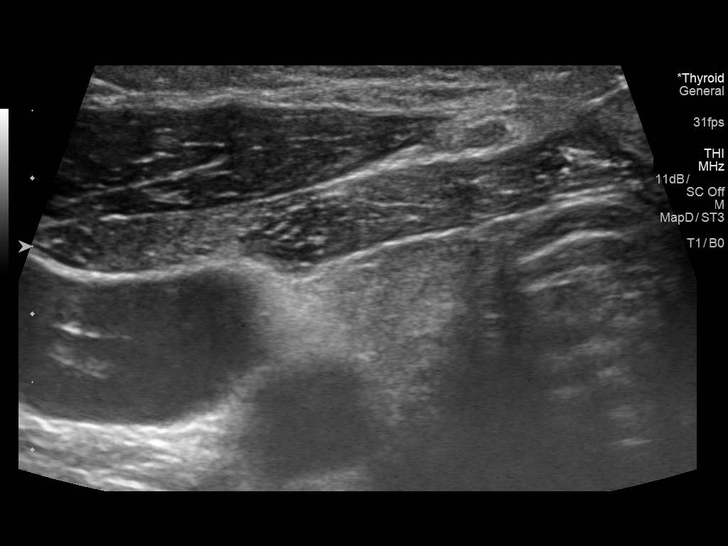
[im 19/29]
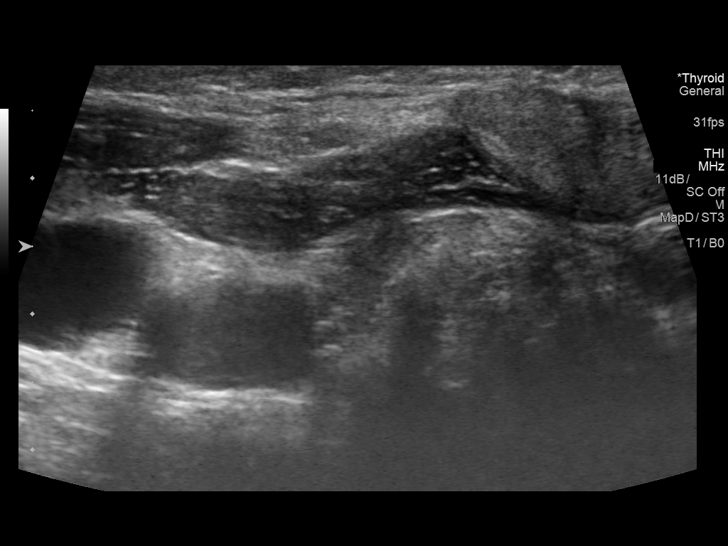
[im 22/29]
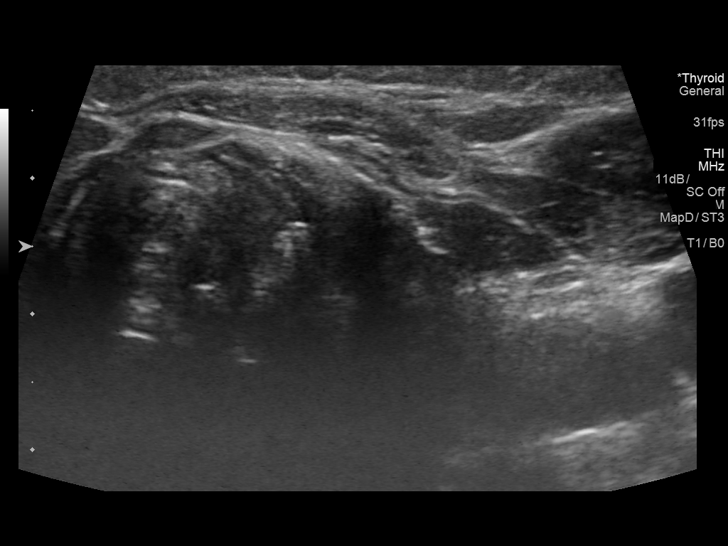
[im 24/29]
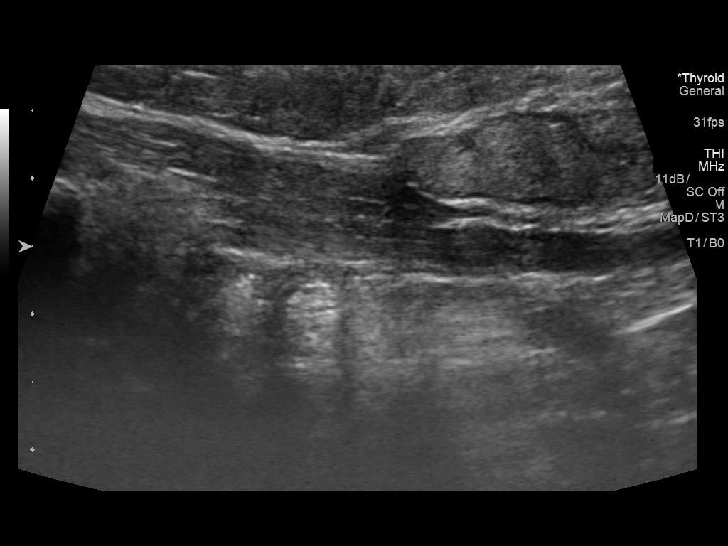
[im 26/29]
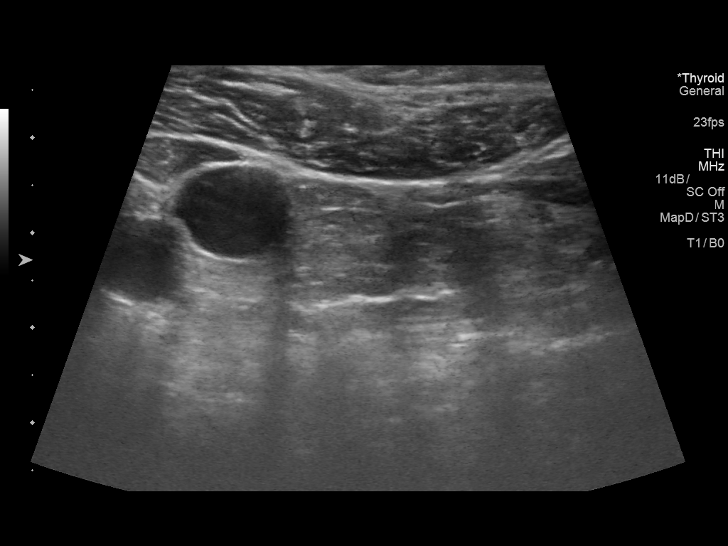
[im 29/29]
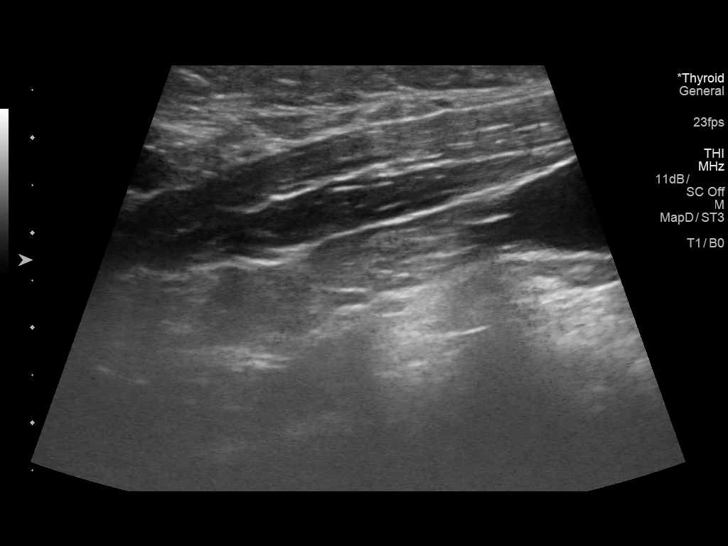

[13 of 25 positions shown; findings below may reference images not displayed]

12/31/2016; ultrasound-guided left-sided
thyroid nodule fine-needle aspiration-01/07/2017
FINDINGS: Parenchymal Echotexture: Normal

Isthmus: Normal in size measuring 0.2 cm in diameter, unchanged

Right lobe: Normal in size measuring 3.2 x 1.2 x 1.3 cm, unchanged,
previously, 3.2 x 1.0 x 0.7 cm

Left lobe: Patient has undergone interval left thyroid lobectomy.
There is no residual nodular soft tissue within the left lobectomy
resection bed.

_________________________________________________________

Estimated total number of nodules >/= 1 cm: 0

Number of spongiform nodules >/=  2 cm not described below (TR1): 0

Number of mixed cystic and solid nodules >/= 1.5 cm not described
below (TR2): 0

_________________________________________________________

No discrete nodules are seen within the thyroid gland.

No bulky cervical lymphadenopathy.
IMPRESSION: Interval left thyroid lobectomy without evidence of residual or
locally recurrent disease.

The above is in keeping with the ACR TI-RADS recommendations - [HOSPITAL] 8881;[DATE].

## 2020-04-13 ENCOUNTER — Ambulatory Visit
Admission: RE | Admit: 2020-04-13 | Discharge: 2020-04-13 | Disposition: A | Discharge status: Home / Self Care | Payer: Medicare Other | Source: Ambulatory Visit | Attending: Otolaryngology | Admitting: Otolaryngology

## 2020-04-13 DIAGNOSIS — Z9889 Other specified postprocedural states: Secondary | ICD-10-CM | POA: Diagnosis not present

## 2020-04-13 DIAGNOSIS — K115 Sialolithiasis: Secondary | ICD-10-CM | POA: Diagnosis not present

## 2020-04-13 DIAGNOSIS — M47816 Spondylosis without myelopathy or radiculopathy, lumbar region: Secondary | ICD-10-CM | POA: Diagnosis not present

## 2020-04-13 DIAGNOSIS — M47812 Spondylosis without myelopathy or radiculopathy, cervical region: Secondary | ICD-10-CM | POA: Diagnosis not present

## 2020-04-13 MED ORDER — IOPAMIDOL (ISOVUE-300) INJECTION 61%
75.0000 mL | Freq: Once | INTRAVENOUS | Status: AC | PRN
  Administered 2020-04-13: 75 mL via INTRAVENOUS

## 2020-04-14 DIAGNOSIS — Z23 Encounter for immunization: Secondary | ICD-10-CM | POA: Diagnosis not present

## 2020-04-19 DIAGNOSIS — J3089 Other allergic rhinitis: Secondary | ICD-10-CM | POA: Diagnosis not present

## 2020-04-19 DIAGNOSIS — J301 Allergic rhinitis due to pollen: Secondary | ICD-10-CM | POA: Diagnosis not present

## 2020-04-19 DIAGNOSIS — R0902 Hypoxemia: Secondary | ICD-10-CM | POA: Diagnosis not present

## 2020-04-19 DIAGNOSIS — G4733 Obstructive sleep apnea (adult) (pediatric): Secondary | ICD-10-CM | POA: Diagnosis not present

## 2020-04-25 DIAGNOSIS — F4322 Adjustment disorder with anxiety: Secondary | ICD-10-CM | POA: Diagnosis not present

## 2020-05-02 DIAGNOSIS — J301 Allergic rhinitis due to pollen: Secondary | ICD-10-CM | POA: Diagnosis not present

## 2020-05-02 DIAGNOSIS — J3089 Other allergic rhinitis: Secondary | ICD-10-CM | POA: Diagnosis not present

## 2020-05-15 DIAGNOSIS — J4540 Moderate persistent asthma, uncomplicated: Secondary | ICD-10-CM | POA: Diagnosis not present

## 2020-05-15 DIAGNOSIS — J3089 Other allergic rhinitis: Secondary | ICD-10-CM | POA: Diagnosis not present

## 2020-05-15 DIAGNOSIS — J4541 Moderate persistent asthma with (acute) exacerbation: Secondary | ICD-10-CM | POA: Diagnosis not present

## 2020-05-15 DIAGNOSIS — J301 Allergic rhinitis due to pollen: Secondary | ICD-10-CM | POA: Diagnosis not present

## 2020-05-21 DIAGNOSIS — J301 Allergic rhinitis due to pollen: Secondary | ICD-10-CM | POA: Diagnosis not present

## 2020-05-21 DIAGNOSIS — J3089 Other allergic rhinitis: Secondary | ICD-10-CM | POA: Diagnosis not present

## 2020-05-24 DIAGNOSIS — K219 Gastro-esophageal reflux disease without esophagitis: Secondary | ICD-10-CM | POA: Diagnosis not present

## 2020-05-24 DIAGNOSIS — M1711 Unilateral primary osteoarthritis, right knee: Secondary | ICD-10-CM | POA: Diagnosis not present

## 2020-05-24 DIAGNOSIS — E039 Hypothyroidism, unspecified: Secondary | ICD-10-CM | POA: Diagnosis not present

## 2020-05-24 DIAGNOSIS — E7849 Other hyperlipidemia: Secondary | ICD-10-CM | POA: Diagnosis not present

## 2020-05-24 DIAGNOSIS — M1990 Unspecified osteoarthritis, unspecified site: Secondary | ICD-10-CM | POA: Diagnosis not present

## 2020-05-24 DIAGNOSIS — I10 Essential (primary) hypertension: Secondary | ICD-10-CM | POA: Diagnosis not present

## 2020-05-24 DIAGNOSIS — J45909 Unspecified asthma, uncomplicated: Secondary | ICD-10-CM | POA: Diagnosis not present

## 2020-05-24 DIAGNOSIS — E782 Mixed hyperlipidemia: Secondary | ICD-10-CM | POA: Diagnosis not present

## 2020-05-25 DIAGNOSIS — N764 Abscess of vulva: Secondary | ICD-10-CM | POA: Diagnosis not present

## 2020-06-05 DIAGNOSIS — J301 Allergic rhinitis due to pollen: Secondary | ICD-10-CM | POA: Diagnosis not present

## 2020-06-05 DIAGNOSIS — J3089 Other allergic rhinitis: Secondary | ICD-10-CM | POA: Diagnosis not present

## 2020-06-20 DIAGNOSIS — M170 Bilateral primary osteoarthritis of knee: Secondary | ICD-10-CM | POA: Diagnosis not present

## 2020-06-20 DIAGNOSIS — M1712 Unilateral primary osteoarthritis, left knee: Secondary | ICD-10-CM | POA: Diagnosis not present

## 2020-06-25 DIAGNOSIS — J3089 Other allergic rhinitis: Secondary | ICD-10-CM | POA: Diagnosis not present

## 2020-06-25 DIAGNOSIS — J301 Allergic rhinitis due to pollen: Secondary | ICD-10-CM | POA: Diagnosis not present

## 2020-06-27 DIAGNOSIS — M1712 Unilateral primary osteoarthritis, left knee: Secondary | ICD-10-CM | POA: Diagnosis present

## 2020-07-04 ENCOUNTER — Telehealth: Payer: Self-pay | Admitting: Pulmonary Disease

## 2020-07-04 NOTE — Telephone Encounter (Signed)
Pt returning missed call. States she will call back by Monday 07/09/20 to sched surgery clearance appt.

## 2020-07-04 NOTE — Telephone Encounter (Signed)
Received request for clearance for left total knee replacement planned for 10/30/2020  Please make clinic visit at next available to examine and give clearance.

## 2020-07-04 NOTE — Telephone Encounter (Signed)
ATC Patient.  LM for Patient to call office to schedule surgery clearance appointment with Dr. Vaughan Browner.

## 2020-07-05 NOTE — Telephone Encounter (Signed)
Noted. Will forward to [facility] to follow up on.

## 2020-07-05 NOTE — Telephone Encounter (Signed)
Noted.  I will follow up with Patient 07/09/20.

## 2020-07-09 NOTE — Telephone Encounter (Signed)
Patient returned call and scheduled with Dr. Vaughan Browner 08/02/20 for surgical clearance OV. Nothing further at this time.

## 2020-07-11 DIAGNOSIS — J3089 Other allergic rhinitis: Secondary | ICD-10-CM | POA: Diagnosis not present

## 2020-07-11 DIAGNOSIS — J301 Allergic rhinitis due to pollen: Secondary | ICD-10-CM | POA: Diagnosis not present

## 2020-07-20 DIAGNOSIS — F4323 Adjustment disorder with mixed anxiety and depressed mood: Secondary | ICD-10-CM | POA: Diagnosis not present

## 2020-07-26 DIAGNOSIS — H52203 Unspecified astigmatism, bilateral: Secondary | ICD-10-CM | POA: Diagnosis not present

## 2020-07-26 DIAGNOSIS — J3089 Other allergic rhinitis: Secondary | ICD-10-CM | POA: Diagnosis not present

## 2020-07-26 DIAGNOSIS — H5213 Myopia, bilateral: Secondary | ICD-10-CM | POA: Diagnosis not present

## 2020-07-26 DIAGNOSIS — H524 Presbyopia: Secondary | ICD-10-CM | POA: Diagnosis not present

## 2020-07-26 DIAGNOSIS — H25013 Cortical age-related cataract, bilateral: Secondary | ICD-10-CM | POA: Diagnosis not present

## 2020-07-26 DIAGNOSIS — J301 Allergic rhinitis due to pollen: Secondary | ICD-10-CM | POA: Diagnosis not present

## 2020-07-30 DIAGNOSIS — I10 Essential (primary) hypertension: Secondary | ICD-10-CM | POA: Diagnosis not present

## 2020-07-30 DIAGNOSIS — K219 Gastro-esophageal reflux disease without esophagitis: Secondary | ICD-10-CM | POA: Diagnosis not present

## 2020-07-30 DIAGNOSIS — E039 Hypothyroidism, unspecified: Secondary | ICD-10-CM | POA: Diagnosis not present

## 2020-07-30 DIAGNOSIS — E782 Mixed hyperlipidemia: Secondary | ICD-10-CM | POA: Diagnosis not present

## 2020-07-30 DIAGNOSIS — M1711 Unilateral primary osteoarthritis, right knee: Secondary | ICD-10-CM | POA: Diagnosis not present

## 2020-07-30 DIAGNOSIS — M1990 Unspecified osteoarthritis, unspecified site: Secondary | ICD-10-CM | POA: Diagnosis not present

## 2020-07-30 DIAGNOSIS — J45909 Unspecified asthma, uncomplicated: Secondary | ICD-10-CM | POA: Diagnosis not present

## 2020-08-02 ENCOUNTER — Other Ambulatory Visit: Payer: Self-pay

## 2020-08-02 ENCOUNTER — Encounter: Payer: Self-pay | Admitting: Pulmonary Disease

## 2020-08-02 ENCOUNTER — Ambulatory Visit (INDEPENDENT_AMBULATORY_CARE_PROVIDER_SITE_OTHER): Payer: Medicare Other | Admitting: Pulmonary Disease

## 2020-08-02 VITALS — BP 132/82 | HR 66 | Temp 97.4°F | Ht 63.0 in | Wt 205.4 lb

## 2020-08-02 DIAGNOSIS — J45909 Unspecified asthma, uncomplicated: Secondary | ICD-10-CM | POA: Diagnosis not present

## 2020-08-02 NOTE — Progress Notes (Addendum)
Becky Davis    [birth year]  Primary Care Physician:Wong, Edwyna Shell, MD  Referring Physician: [name], MD [address] [place],  [place] [address]  Chief complaint: Follow-up for asthma  HPI: 70 year old with hypertension, asthma, allergies, sleep apnea, thyroidectomy Follows with [facility] and is on immunotherapy, allergy shots.  She was seen at her allergy office in early July for exacerbation given a prednisone taper.  Prior to that he was seen at [facility] on ED in January 2019 for intermittent hemoptysis, asthma exacerbation treated with azithromycin and steroids.  CT did not show pulmonary embolism, lung mass or nodule. She had another exacerbation in December 2019 requiring prednisone  Maintained on Symbicort, singular, albuterol as needed.  Has daily symptoms with dyspnea with activity, nighttime awakenings.  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  Exposures: Reports remote asbestos exposure.  No mold, hot tub, Jacuzzi. Smoking history: Never smoker Travel history: Lived in [place] all her life.  Vacation travel to [place], [place].   Interim history: Continues on Symbicort 160 and Singulair Symptoms are stable.  Hardly needs to use her rescue inhaler.  No exacerbations for the past year or  She underwent right knee replacement on 12/20/2019 without any complications She is being evaluated for left knee replacement and is here for perioperative evaluation.  Outpatient Encounter Medications as of 08/02/2020  Medication Sig  . albuterol (PROAIR HFA) 108 (90 Base) MCG/ACT inhaler Inhale 2 puffs into the lungs as needed (Shortness of breath).  Marland Kitchen albuterol (PROVENTIL) (2.5 MG/3ML) 0.083% nebulizer solution Take 2.5 mg by nebulization every 4 (four) hours as needed for wheezing or shortness of breath.  . ARIPiprazole (ABILIFY) 5 MG tablet Take 5 mg by mouth at bedtime.   Marland Kitchen aspirin EC 325 MG tablet  Take 1 tablet (325 mg total) by mouth 2 (two) times daily.  . baclofen (LIORESAL) 10 MG tablet Take 1 tablet (10 mg total) by mouth 3 (three) times daily. As needed for muscle spasm (Patient taking differently: Take 10 mg by mouth 2 (two) times daily. As needed for muscle spasm)  . budesonide-formoterol (SYMBICORT) 160-4.5 MCG/ACT inhaler Inhale 2 puffs into the lungs 2 (two) times daily.  . calcitRIOL (ROCALTROL) 0.25 MCG capsule Take 0.25 mcg by mouth daily.  . Calcium Carbonate-Vitamin D (CALTRATE 600+D PO) Take 500 mg by mouth 2 (two) times daily.   . cetirizine (ZYRTEC) 10 MG tablet Take 10 mg by mouth daily.  Marland Kitchen HYDROcodone-acetaminophen (NORCO) 10-325 MG tablet Take 1 tablet by mouth every 6 (six) hours as needed.  Marland Kitchen levothyroxine (SYNTHROID, LEVOTHROID) 100 MCG tablet Take 100 mcg by mouth daily before breakfast.  . losartan (COZAAR) 100 MG tablet Take 0.5 tablets (50 mg total) by mouth daily.  . montelukast (SINGULAIR) 10 MG tablet Take 10 mg by mouth at bedtime.  Marland Kitchen PARoxetine (PAXIL) 20 MG tablet Take 20 mg by mouth at bedtime.   . sennosides-docusate sodium (SENOKOT-S) 8.6-50 MG tablet Take 2 tablets by mouth daily.   No facility-administered encounter medications on file as of 08/02/2020.   Physical Exam: Blood pressure 132/82, pulse 66, temperature (!) 97.4 F (36.3 C), temperature source Oral, height 5\' 3"  (1.6 m), weight 205 lb 6.4 oz (93.2 kg), SpO2 98 %. Gen:      No acute distress HEENT:  EOMI, sclera anicteric Neck:     No masses; no thyromegaly Lungs:  Clear to auscultation bilaterally; normal respiratory effort CV:         Regular rate and rhythm; no murmurs Abd:      + bowel sounds; soft, non-tender; no palpable masses, no distension Ext:    No edema; adequate peripheral perfusion Skin:      Warm and dry; no rash Neuro: alert and oriented x 3 Psych: normal mood and affect  Data Reviewed: Imaging CT angio 07/27/2017- no pulmonary embolism, bronchial wall  thickening. CT sinus 07/02/2018-mild mucosal thickening in the paranasal sinus. I have reviewed the images personally.  PFTs 04/12/18 FVC 2.73 [93%), FEV1 2.02 [91%), F/F 74, TLC 94%, DLCO 80% Minimal obstruction with bronchodilator response.  FENO 02/08/2018-17 FENO 04/12/2018-90 FENO 08/06/2018-13  Labs CBC 01/19/2018-WBC 7.7, eos 2.1%, absolute eosinophil count 162 CBC 06/30/2018-WBC 88, eos 2.5%, absolute eosinophil count 220 Blood allergy profile 01/19/2018-IgE 24, sensitive to dust mite.  Assessment:  Severe persistent asthma Continue Symbicort 160/4.5, Singulair Symptoms are well controlled ACT score 20  Preop clearance for total knee replacement She has been optimized from a pulmonary standpoint.  Low risk for perioperative complications by ARISCAT score Estimated 2.2% pulmonary complication rate  She is at acceptable risk with no contraindications for surgery  Allergic rhinitis Continue immunotherapy, Singulair  GERD Protonix  OSA Stable on CPAP which she is using regularly.  Follows with Dr. Maxwell Caul, sleep medicine at [facility].  Plan/Recommendations: - Continue Symbicort, singular - No contraindications to knee surgery - Continue Protonix.  Marshell Garfinkel MD  Pulmonary and Critical Care 08/02/2020, 9:44 AM  CC: [name], MD

## 2020-08-02 NOTE — Patient Instructions (Signed)
We will send a note to your orthopedic doctor regarding your knee surgery Continue the inhalers Follow-up in 6 months.

## 2020-08-08 DIAGNOSIS — J301 Allergic rhinitis due to pollen: Secondary | ICD-10-CM | POA: Diagnosis not present

## 2020-08-08 DIAGNOSIS — J3089 Other allergic rhinitis: Secondary | ICD-10-CM | POA: Diagnosis not present

## 2020-08-09 DIAGNOSIS — R7303 Prediabetes: Secondary | ICD-10-CM | POA: Diagnosis not present

## 2020-08-09 DIAGNOSIS — E209 Hypoparathyroidism, unspecified: Secondary | ICD-10-CM | POA: Diagnosis not present

## 2020-08-09 DIAGNOSIS — K219 Gastro-esophageal reflux disease without esophagitis: Secondary | ICD-10-CM | POA: Diagnosis not present

## 2020-08-09 DIAGNOSIS — M1990 Unspecified osteoarthritis, unspecified site: Secondary | ICD-10-CM | POA: Diagnosis not present

## 2020-08-09 DIAGNOSIS — J45909 Unspecified asthma, uncomplicated: Secondary | ICD-10-CM | POA: Diagnosis not present

## 2020-08-09 DIAGNOSIS — E782 Mixed hyperlipidemia: Secondary | ICD-10-CM | POA: Diagnosis not present

## 2020-08-09 DIAGNOSIS — Z96651 Presence of right artificial knee joint: Secondary | ICD-10-CM | POA: Diagnosis not present

## 2020-08-09 DIAGNOSIS — Z01818 Encounter for other preprocedural examination: Secondary | ICD-10-CM | POA: Diagnosis not present

## 2020-08-09 DIAGNOSIS — E039 Hypothyroidism, unspecified: Secondary | ICD-10-CM | POA: Diagnosis not present

## 2020-08-09 DIAGNOSIS — I10 Essential (primary) hypertension: Secondary | ICD-10-CM | POA: Diagnosis not present

## 2020-08-17 DIAGNOSIS — F4323 Adjustment disorder with mixed anxiety and depressed mood: Secondary | ICD-10-CM | POA: Diagnosis not present

## 2020-08-22 DIAGNOSIS — J301 Allergic rhinitis due to pollen: Secondary | ICD-10-CM | POA: Diagnosis not present

## 2020-08-22 DIAGNOSIS — J3089 Other allergic rhinitis: Secondary | ICD-10-CM | POA: Diagnosis not present

## 2020-09-04 DIAGNOSIS — K219 Gastro-esophageal reflux disease without esophagitis: Secondary | ICD-10-CM | POA: Diagnosis not present

## 2020-09-04 DIAGNOSIS — M1711 Unilateral primary osteoarthritis, right knee: Secondary | ICD-10-CM | POA: Diagnosis not present

## 2020-09-04 DIAGNOSIS — E039 Hypothyroidism, unspecified: Secondary | ICD-10-CM | POA: Diagnosis not present

## 2020-09-04 DIAGNOSIS — J45909 Unspecified asthma, uncomplicated: Secondary | ICD-10-CM | POA: Diagnosis not present

## 2020-09-04 DIAGNOSIS — E782 Mixed hyperlipidemia: Secondary | ICD-10-CM | POA: Diagnosis not present

## 2020-09-04 DIAGNOSIS — I10 Essential (primary) hypertension: Secondary | ICD-10-CM | POA: Diagnosis not present

## 2020-09-04 DIAGNOSIS — M1990 Unspecified osteoarthritis, unspecified site: Secondary | ICD-10-CM | POA: Diagnosis not present

## 2020-09-05 DIAGNOSIS — J3089 Other allergic rhinitis: Secondary | ICD-10-CM | POA: Diagnosis not present

## 2020-09-05 DIAGNOSIS — J301 Allergic rhinitis due to pollen: Secondary | ICD-10-CM | POA: Diagnosis not present

## 2020-09-19 DIAGNOSIS — J3089 Other allergic rhinitis: Secondary | ICD-10-CM | POA: Diagnosis not present

## 2020-09-19 DIAGNOSIS — J301 Allergic rhinitis due to pollen: Secondary | ICD-10-CM | POA: Diagnosis not present

## 2020-09-21 DIAGNOSIS — M545 Low back pain, unspecified: Secondary | ICD-10-CM | POA: Diagnosis not present

## 2020-09-21 DIAGNOSIS — M25551 Pain in right hip: Secondary | ICD-10-CM | POA: Diagnosis not present

## 2020-09-22 DIAGNOSIS — J3089 Other allergic rhinitis: Secondary | ICD-10-CM | POA: Diagnosis not present

## 2020-09-22 DIAGNOSIS — J301 Allergic rhinitis due to pollen: Secondary | ICD-10-CM | POA: Diagnosis not present

## 2020-10-02 DIAGNOSIS — J301 Allergic rhinitis due to pollen: Secondary | ICD-10-CM | POA: Diagnosis not present

## 2020-10-02 DIAGNOSIS — J3089 Other allergic rhinitis: Secondary | ICD-10-CM | POA: Diagnosis not present

## 2020-10-02 DIAGNOSIS — F4322 Adjustment disorder with anxiety: Secondary | ICD-10-CM | POA: Diagnosis not present

## 2020-10-04 DIAGNOSIS — R7303 Prediabetes: Secondary | ICD-10-CM | POA: Diagnosis not present

## 2020-10-04 DIAGNOSIS — R413 Other amnesia: Secondary | ICD-10-CM | POA: Diagnosis not present

## 2020-10-04 DIAGNOSIS — J301 Allergic rhinitis due to pollen: Secondary | ICD-10-CM | POA: Diagnosis not present

## 2020-10-04 DIAGNOSIS — E209 Hypoparathyroidism, unspecified: Secondary | ICD-10-CM | POA: Diagnosis not present

## 2020-10-04 DIAGNOSIS — J45909 Unspecified asthma, uncomplicated: Secondary | ICD-10-CM | POA: Diagnosis not present

## 2020-10-04 DIAGNOSIS — I10 Essential (primary) hypertension: Secondary | ICD-10-CM | POA: Diagnosis not present

## 2020-10-04 DIAGNOSIS — Z01818 Encounter for other preprocedural examination: Secondary | ICD-10-CM | POA: Diagnosis not present

## 2020-10-04 DIAGNOSIS — Z96651 Presence of right artificial knee joint: Secondary | ICD-10-CM | POA: Diagnosis not present

## 2020-10-04 DIAGNOSIS — E782 Mixed hyperlipidemia: Secondary | ICD-10-CM | POA: Diagnosis not present

## 2020-10-04 DIAGNOSIS — K219 Gastro-esophageal reflux disease without esophagitis: Secondary | ICD-10-CM | POA: Diagnosis not present

## 2020-10-04 DIAGNOSIS — E039 Hypothyroidism, unspecified: Secondary | ICD-10-CM | POA: Diagnosis not present

## 2020-10-08 DIAGNOSIS — U071 COVID-19: Secondary | ICD-10-CM | POA: Diagnosis not present

## 2020-10-08 DIAGNOSIS — J069 Acute upper respiratory infection, unspecified: Secondary | ICD-10-CM | POA: Diagnosis not present

## 2020-10-08 DIAGNOSIS — R059 Cough, unspecified: Secondary | ICD-10-CM | POA: Diagnosis not present

## 2020-10-08 DIAGNOSIS — Z20822 Contact with and (suspected) exposure to covid-19: Secondary | ICD-10-CM | POA: Diagnosis not present

## 2020-10-09 DIAGNOSIS — U071 COVID-19: Secondary | ICD-10-CM

## 2020-10-10 NOTE — Telephone Encounter (Signed)
Please advise on patient mychart message  I have tested positive for Covid19. Is there any preventative treatment or medication that I should take?  I am both vaccinated and boostered and seem to have a mild case at this point. Marcial Pacas

## 2020-10-10 NOTE — Telephone Encounter (Signed)
If it is less than 10 days of symptom onset then the is a candidate for therapy. I have made a referral for treatment consideration. She may get a phone call in the next day or 2

## 2020-10-11 ENCOUNTER — Telehealth: Payer: Self-pay

## 2020-10-11 ENCOUNTER — Telehealth: Payer: Self-pay | Admitting: Physician Assistant

## 2020-10-11 DIAGNOSIS — J45909 Unspecified asthma, uncomplicated: Secondary | ICD-10-CM | POA: Diagnosis not present

## 2020-10-11 DIAGNOSIS — E039 Hypothyroidism, unspecified: Secondary | ICD-10-CM | POA: Diagnosis not present

## 2020-10-11 DIAGNOSIS — E782 Mixed hyperlipidemia: Secondary | ICD-10-CM | POA: Diagnosis not present

## 2020-10-11 DIAGNOSIS — M1711 Unilateral primary osteoarthritis, right knee: Secondary | ICD-10-CM | POA: Diagnosis not present

## 2020-10-11 DIAGNOSIS — K219 Gastro-esophageal reflux disease without esophagitis: Secondary | ICD-10-CM | POA: Diagnosis not present

## 2020-10-11 DIAGNOSIS — I10 Essential (primary) hypertension: Secondary | ICD-10-CM | POA: Diagnosis not present

## 2020-10-11 DIAGNOSIS — M1990 Unspecified osteoarthritis, unspecified site: Secondary | ICD-10-CM | POA: Diagnosis not present

## 2020-10-11 NOTE — Telephone Encounter (Signed)
Called to discuss with patient about COVID-19 symptoms and the use of one of the available treatments for those with mild to moderate Covid symptoms and at a high risk of hospitalization.  Pt appears to qualify for outpatient treatment due to co-morbid conditions and/or a member of an at-risk group in accordance with the FDA Emergency Use Authorization.    Symptom onset: Sinus congestion, cough Vaccinated: Yes Booster? Yes Immunocompromised? No Qualifiers: Asthma, HTN  Pt. Would like to speak with APP.   Marcello Moores

## 2020-10-11 NOTE — Telephone Encounter (Signed)
Spoke with patient.  Her symptoms started 6 days ago.  She is out of window for oral antiviral therapy.  We cannot give her monoclonal antibody as well as it only given once a week and no opening until next Wednesday.  I have recommended her to follow-up with PCP for further direction.  She is agree with plan and appreciative of call.

## 2020-10-18 ENCOUNTER — Other Ambulatory Visit (HOSPITAL_COMMUNITY): Payer: Medicare Other

## 2020-10-24 DIAGNOSIS — M25551 Pain in right hip: Secondary | ICD-10-CM | POA: Diagnosis not present

## 2020-10-24 DIAGNOSIS — M545 Low back pain, unspecified: Secondary | ICD-10-CM | POA: Diagnosis not present

## 2020-10-30 ENCOUNTER — Ambulatory Visit: Admit: 2020-10-30 | Payer: Medicare Other | Admitting: Orthopedic Surgery

## 2020-10-30 SURGERY — ARTHROPLASTY, KNEE, TOTAL
Anesthesia: Choice | Site: Knee | Laterality: Left

## 2020-11-05 DIAGNOSIS — J45909 Unspecified asthma, uncomplicated: Secondary | ICD-10-CM | POA: Diagnosis not present

## 2020-11-05 DIAGNOSIS — M1711 Unilateral primary osteoarthritis, right knee: Secondary | ICD-10-CM | POA: Diagnosis not present

## 2020-11-05 DIAGNOSIS — K219 Gastro-esophageal reflux disease without esophagitis: Secondary | ICD-10-CM | POA: Diagnosis not present

## 2020-11-05 DIAGNOSIS — E039 Hypothyroidism, unspecified: Secondary | ICD-10-CM | POA: Diagnosis not present

## 2020-11-05 DIAGNOSIS — M1990 Unspecified osteoarthritis, unspecified site: Secondary | ICD-10-CM | POA: Diagnosis not present

## 2020-11-05 DIAGNOSIS — I10 Essential (primary) hypertension: Secondary | ICD-10-CM | POA: Diagnosis not present

## 2020-11-05 DIAGNOSIS — E782 Mixed hyperlipidemia: Secondary | ICD-10-CM | POA: Diagnosis not present

## 2020-11-19 DIAGNOSIS — J301 Allergic rhinitis due to pollen: Secondary | ICD-10-CM | POA: Diagnosis not present

## 2020-11-19 DIAGNOSIS — J3089 Other allergic rhinitis: Secondary | ICD-10-CM | POA: Diagnosis not present

## 2020-11-21 DIAGNOSIS — I10 Essential (primary) hypertension: Secondary | ICD-10-CM | POA: Diagnosis not present

## 2020-11-21 DIAGNOSIS — K219 Gastro-esophageal reflux disease without esophagitis: Secondary | ICD-10-CM | POA: Diagnosis not present

## 2020-11-21 DIAGNOSIS — E039 Hypothyroidism, unspecified: Secondary | ICD-10-CM | POA: Diagnosis not present

## 2020-11-21 DIAGNOSIS — M1990 Unspecified osteoarthritis, unspecified site: Secondary | ICD-10-CM | POA: Diagnosis not present

## 2020-11-21 DIAGNOSIS — J45909 Unspecified asthma, uncomplicated: Secondary | ICD-10-CM | POA: Diagnosis not present

## 2020-11-21 DIAGNOSIS — E782 Mixed hyperlipidemia: Secondary | ICD-10-CM | POA: Diagnosis not present

## 2020-11-27 DIAGNOSIS — J301 Allergic rhinitis due to pollen: Secondary | ICD-10-CM | POA: Diagnosis not present

## 2020-11-27 DIAGNOSIS — J3089 Other allergic rhinitis: Secondary | ICD-10-CM | POA: Diagnosis not present

## 2020-11-28 DIAGNOSIS — E039 Hypothyroidism, unspecified: Secondary | ICD-10-CM | POA: Diagnosis not present

## 2020-11-28 DIAGNOSIS — E669 Obesity, unspecified: Secondary | ICD-10-CM | POA: Diagnosis not present

## 2020-12-03 DIAGNOSIS — J301 Allergic rhinitis due to pollen: Secondary | ICD-10-CM | POA: Diagnosis not present

## 2020-12-03 DIAGNOSIS — J3089 Other allergic rhinitis: Secondary | ICD-10-CM | POA: Diagnosis not present

## 2020-12-05 DIAGNOSIS — E209 Hypoparathyroidism, unspecified: Secondary | ICD-10-CM | POA: Diagnosis not present

## 2020-12-05 DIAGNOSIS — E039 Hypothyroidism, unspecified: Secondary | ICD-10-CM | POA: Diagnosis not present

## 2020-12-06 DIAGNOSIS — M25551 Pain in right hip: Secondary | ICD-10-CM | POA: Diagnosis not present

## 2020-12-06 DIAGNOSIS — J3089 Other allergic rhinitis: Secondary | ICD-10-CM | POA: Diagnosis not present

## 2020-12-06 DIAGNOSIS — M5116 Intervertebral disc disorders with radiculopathy, lumbar region: Secondary | ICD-10-CM | POA: Diagnosis not present

## 2020-12-06 DIAGNOSIS — J4540 Moderate persistent asthma, uncomplicated: Secondary | ICD-10-CM | POA: Diagnosis not present

## 2020-12-06 DIAGNOSIS — J301 Allergic rhinitis due to pollen: Secondary | ICD-10-CM | POA: Diagnosis not present

## 2020-12-06 DIAGNOSIS — M9903 Segmental and somatic dysfunction of lumbar region: Secondary | ICD-10-CM | POA: Diagnosis not present

## 2020-12-06 DIAGNOSIS — M9905 Segmental and somatic dysfunction of pelvic region: Secondary | ICD-10-CM | POA: Diagnosis not present

## 2020-12-13 DIAGNOSIS — M9903 Segmental and somatic dysfunction of lumbar region: Secondary | ICD-10-CM | POA: Diagnosis not present

## 2020-12-13 DIAGNOSIS — I10 Essential (primary) hypertension: Secondary | ICD-10-CM | POA: Diagnosis not present

## 2020-12-13 DIAGNOSIS — K219 Gastro-esophageal reflux disease without esophagitis: Secondary | ICD-10-CM | POA: Diagnosis not present

## 2020-12-13 DIAGNOSIS — M9905 Segmental and somatic dysfunction of pelvic region: Secondary | ICD-10-CM | POA: Diagnosis not present

## 2020-12-13 DIAGNOSIS — M5116 Intervertebral disc disorders with radiculopathy, lumbar region: Secondary | ICD-10-CM | POA: Diagnosis not present

## 2020-12-13 DIAGNOSIS — E782 Mixed hyperlipidemia: Secondary | ICD-10-CM | POA: Diagnosis not present

## 2020-12-13 DIAGNOSIS — M25551 Pain in right hip: Secondary | ICD-10-CM | POA: Diagnosis not present

## 2020-12-13 DIAGNOSIS — J45909 Unspecified asthma, uncomplicated: Secondary | ICD-10-CM | POA: Diagnosis not present

## 2020-12-13 DIAGNOSIS — E039 Hypothyroidism, unspecified: Secondary | ICD-10-CM | POA: Diagnosis not present

## 2020-12-17 DIAGNOSIS — M25551 Pain in right hip: Secondary | ICD-10-CM | POA: Diagnosis not present

## 2020-12-17 DIAGNOSIS — M9905 Segmental and somatic dysfunction of pelvic region: Secondary | ICD-10-CM | POA: Diagnosis not present

## 2020-12-17 DIAGNOSIS — M9903 Segmental and somatic dysfunction of lumbar region: Secondary | ICD-10-CM | POA: Diagnosis not present

## 2020-12-17 DIAGNOSIS — M5116 Intervertebral disc disorders with radiculopathy, lumbar region: Secondary | ICD-10-CM | POA: Diagnosis not present

## 2020-12-20 DIAGNOSIS — M9905 Segmental and somatic dysfunction of pelvic region: Secondary | ICD-10-CM | POA: Diagnosis not present

## 2020-12-20 DIAGNOSIS — M25551 Pain in right hip: Secondary | ICD-10-CM | POA: Diagnosis not present

## 2020-12-20 DIAGNOSIS — M9903 Segmental and somatic dysfunction of lumbar region: Secondary | ICD-10-CM | POA: Diagnosis not present

## 2020-12-20 DIAGNOSIS — M5116 Intervertebral disc disorders with radiculopathy, lumbar region: Secondary | ICD-10-CM | POA: Diagnosis not present

## 2020-12-25 DIAGNOSIS — M9903 Segmental and somatic dysfunction of lumbar region: Secondary | ICD-10-CM | POA: Diagnosis not present

## 2020-12-25 DIAGNOSIS — M9905 Segmental and somatic dysfunction of pelvic region: Secondary | ICD-10-CM | POA: Diagnosis not present

## 2020-12-25 DIAGNOSIS — M5116 Intervertebral disc disorders with radiculopathy, lumbar region: Secondary | ICD-10-CM | POA: Diagnosis not present

## 2020-12-25 DIAGNOSIS — M25551 Pain in right hip: Secondary | ICD-10-CM | POA: Diagnosis not present

## 2020-12-26 DIAGNOSIS — J3089 Other allergic rhinitis: Secondary | ICD-10-CM | POA: Diagnosis not present

## 2020-12-26 DIAGNOSIS — J301 Allergic rhinitis due to pollen: Secondary | ICD-10-CM | POA: Diagnosis not present

## 2020-12-26 DIAGNOSIS — F4322 Adjustment disorder with anxiety: Secondary | ICD-10-CM | POA: Diagnosis not present

## 2020-12-26 DIAGNOSIS — J4540 Moderate persistent asthma, uncomplicated: Secondary | ICD-10-CM | POA: Diagnosis not present

## 2020-12-27 DIAGNOSIS — M5116 Intervertebral disc disorders with radiculopathy, lumbar region: Secondary | ICD-10-CM | POA: Diagnosis not present

## 2020-12-27 DIAGNOSIS — M9903 Segmental and somatic dysfunction of lumbar region: Secondary | ICD-10-CM | POA: Diagnosis not present

## 2020-12-27 DIAGNOSIS — M9905 Segmental and somatic dysfunction of pelvic region: Secondary | ICD-10-CM | POA: Diagnosis not present

## 2020-12-27 DIAGNOSIS — M25551 Pain in right hip: Secondary | ICD-10-CM | POA: Diagnosis not present

## 2021-01-02 DIAGNOSIS — J301 Allergic rhinitis due to pollen: Secondary | ICD-10-CM | POA: Diagnosis not present

## 2021-01-02 DIAGNOSIS — J3089 Other allergic rhinitis: Secondary | ICD-10-CM | POA: Diagnosis not present

## 2021-01-03 DIAGNOSIS — M9905 Segmental and somatic dysfunction of pelvic region: Secondary | ICD-10-CM | POA: Diagnosis not present

## 2021-01-03 DIAGNOSIS — M25551 Pain in right hip: Secondary | ICD-10-CM | POA: Diagnosis not present

## 2021-01-03 DIAGNOSIS — M9903 Segmental and somatic dysfunction of lumbar region: Secondary | ICD-10-CM | POA: Diagnosis not present

## 2021-01-03 DIAGNOSIS — M5116 Intervertebral disc disorders with radiculopathy, lumbar region: Secondary | ICD-10-CM | POA: Diagnosis not present

## 2021-01-07 DIAGNOSIS — M9905 Segmental and somatic dysfunction of pelvic region: Secondary | ICD-10-CM | POA: Diagnosis not present

## 2021-01-07 DIAGNOSIS — M5116 Intervertebral disc disorders with radiculopathy, lumbar region: Secondary | ICD-10-CM | POA: Diagnosis not present

## 2021-01-07 DIAGNOSIS — M25551 Pain in right hip: Secondary | ICD-10-CM | POA: Diagnosis not present

## 2021-01-07 DIAGNOSIS — M9903 Segmental and somatic dysfunction of lumbar region: Secondary | ICD-10-CM | POA: Diagnosis not present

## 2021-01-10 DIAGNOSIS — J301 Allergic rhinitis due to pollen: Secondary | ICD-10-CM | POA: Diagnosis not present

## 2021-01-10 DIAGNOSIS — J3089 Other allergic rhinitis: Secondary | ICD-10-CM | POA: Diagnosis not present

## 2021-01-15 DIAGNOSIS — J3089 Other allergic rhinitis: Secondary | ICD-10-CM | POA: Diagnosis not present

## 2021-01-15 DIAGNOSIS — J301 Allergic rhinitis due to pollen: Secondary | ICD-10-CM | POA: Diagnosis not present

## 2021-01-16 DIAGNOSIS — Z1389 Encounter for screening for other disorder: Secondary | ICD-10-CM | POA: Diagnosis not present

## 2021-01-16 DIAGNOSIS — Z0000 Encounter for general adult medical examination without abnormal findings: Secondary | ICD-10-CM | POA: Diagnosis not present

## 2021-01-17 DIAGNOSIS — M5431 Sciatica, right side: Secondary | ICD-10-CM | POA: Diagnosis not present

## 2021-01-17 DIAGNOSIS — I10 Essential (primary) hypertension: Secondary | ICD-10-CM | POA: Diagnosis not present

## 2021-01-17 DIAGNOSIS — L245 Irritant contact dermatitis due to other chemical products: Secondary | ICD-10-CM | POA: Diagnosis not present

## 2021-01-17 DIAGNOSIS — M5432 Sciatica, left side: Secondary | ICD-10-CM | POA: Diagnosis not present

## 2021-01-17 DIAGNOSIS — E039 Hypothyroidism, unspecified: Secondary | ICD-10-CM | POA: Diagnosis not present

## 2021-01-17 DIAGNOSIS — R739 Hyperglycemia, unspecified: Secondary | ICD-10-CM | POA: Diagnosis not present

## 2021-01-17 DIAGNOSIS — Z23 Encounter for immunization: Secondary | ICD-10-CM | POA: Diagnosis not present

## 2021-01-20 DIAGNOSIS — Z20822 Contact with and (suspected) exposure to covid-19: Secondary | ICD-10-CM | POA: Diagnosis not present

## 2021-01-21 DIAGNOSIS — M9903 Segmental and somatic dysfunction of lumbar region: Secondary | ICD-10-CM | POA: Diagnosis not present

## 2021-01-21 DIAGNOSIS — M9905 Segmental and somatic dysfunction of pelvic region: Secondary | ICD-10-CM | POA: Diagnosis not present

## 2021-01-21 DIAGNOSIS — M25551 Pain in right hip: Secondary | ICD-10-CM | POA: Diagnosis not present

## 2021-01-21 DIAGNOSIS — M5116 Intervertebral disc disorders with radiculopathy, lumbar region: Secondary | ICD-10-CM | POA: Diagnosis not present

## 2021-01-24 DIAGNOSIS — J301 Allergic rhinitis due to pollen: Secondary | ICD-10-CM | POA: Diagnosis not present

## 2021-01-24 DIAGNOSIS — J3089 Other allergic rhinitis: Secondary | ICD-10-CM | POA: Diagnosis not present

## 2021-01-28 DIAGNOSIS — J3089 Other allergic rhinitis: Secondary | ICD-10-CM | POA: Diagnosis not present

## 2021-01-28 DIAGNOSIS — J301 Allergic rhinitis due to pollen: Secondary | ICD-10-CM | POA: Diagnosis not present

## 2021-01-30 DIAGNOSIS — J3089 Other allergic rhinitis: Secondary | ICD-10-CM | POA: Diagnosis not present

## 2021-01-30 DIAGNOSIS — J301 Allergic rhinitis due to pollen: Secondary | ICD-10-CM | POA: Diagnosis not present

## 2021-02-01 DIAGNOSIS — I10 Essential (primary) hypertension: Secondary | ICD-10-CM | POA: Diagnosis not present

## 2021-02-01 DIAGNOSIS — J45909 Unspecified asthma, uncomplicated: Secondary | ICD-10-CM | POA: Diagnosis not present

## 2021-02-01 DIAGNOSIS — E039 Hypothyroidism, unspecified: Secondary | ICD-10-CM | POA: Diagnosis not present

## 2021-02-01 DIAGNOSIS — K219 Gastro-esophageal reflux disease without esophagitis: Secondary | ICD-10-CM | POA: Diagnosis not present

## 2021-02-01 DIAGNOSIS — M1990 Unspecified osteoarthritis, unspecified site: Secondary | ICD-10-CM | POA: Diagnosis not present

## 2021-02-01 DIAGNOSIS — E782 Mixed hyperlipidemia: Secondary | ICD-10-CM | POA: Diagnosis not present

## 2021-02-01 DIAGNOSIS — M1711 Unilateral primary osteoarthritis, right knee: Secondary | ICD-10-CM | POA: Diagnosis not present

## 2021-02-04 DIAGNOSIS — J301 Allergic rhinitis due to pollen: Secondary | ICD-10-CM | POA: Diagnosis not present

## 2021-02-04 DIAGNOSIS — J3089 Other allergic rhinitis: Secondary | ICD-10-CM | POA: Diagnosis not present

## 2021-02-08 DIAGNOSIS — J301 Allergic rhinitis due to pollen: Secondary | ICD-10-CM | POA: Diagnosis not present

## 2021-02-08 DIAGNOSIS — J3089 Other allergic rhinitis: Secondary | ICD-10-CM | POA: Diagnosis not present

## 2021-02-11 DIAGNOSIS — M9905 Segmental and somatic dysfunction of pelvic region: Secondary | ICD-10-CM | POA: Diagnosis not present

## 2021-02-11 DIAGNOSIS — M9903 Segmental and somatic dysfunction of lumbar region: Secondary | ICD-10-CM | POA: Diagnosis not present

## 2021-02-11 DIAGNOSIS — M25551 Pain in right hip: Secondary | ICD-10-CM | POA: Diagnosis not present

## 2021-02-11 DIAGNOSIS — M5116 Intervertebral disc disorders with radiculopathy, lumbar region: Secondary | ICD-10-CM | POA: Diagnosis not present

## 2021-02-18 DIAGNOSIS — M25551 Pain in right hip: Secondary | ICD-10-CM | POA: Diagnosis not present

## 2021-02-18 DIAGNOSIS — M5116 Intervertebral disc disorders with radiculopathy, lumbar region: Secondary | ICD-10-CM | POA: Diagnosis not present

## 2021-02-18 DIAGNOSIS — M9903 Segmental and somatic dysfunction of lumbar region: Secondary | ICD-10-CM | POA: Diagnosis not present

## 2021-02-18 DIAGNOSIS — M9905 Segmental and somatic dysfunction of pelvic region: Secondary | ICD-10-CM | POA: Diagnosis not present

## 2021-02-20 DIAGNOSIS — J301 Allergic rhinitis due to pollen: Secondary | ICD-10-CM | POA: Diagnosis not present

## 2021-02-20 DIAGNOSIS — J3089 Other allergic rhinitis: Secondary | ICD-10-CM | POA: Diagnosis not present

## 2021-02-27 DIAGNOSIS — M9903 Segmental and somatic dysfunction of lumbar region: Secondary | ICD-10-CM | POA: Diagnosis not present

## 2021-02-27 DIAGNOSIS — M5116 Intervertebral disc disorders with radiculopathy, lumbar region: Secondary | ICD-10-CM | POA: Diagnosis not present

## 2021-02-27 DIAGNOSIS — M9905 Segmental and somatic dysfunction of pelvic region: Secondary | ICD-10-CM | POA: Diagnosis not present

## 2021-02-27 DIAGNOSIS — M25551 Pain in right hip: Secondary | ICD-10-CM | POA: Diagnosis not present

## 2021-03-04 DIAGNOSIS — M9903 Segmental and somatic dysfunction of lumbar region: Secondary | ICD-10-CM | POA: Diagnosis not present

## 2021-03-04 DIAGNOSIS — M25551 Pain in right hip: Secondary | ICD-10-CM | POA: Diagnosis not present

## 2021-03-04 DIAGNOSIS — M9905 Segmental and somatic dysfunction of pelvic region: Secondary | ICD-10-CM | POA: Diagnosis not present

## 2021-03-04 DIAGNOSIS — M5116 Intervertebral disc disorders with radiculopathy, lumbar region: Secondary | ICD-10-CM | POA: Diagnosis not present

## 2021-03-06 DIAGNOSIS — K219 Gastro-esophageal reflux disease without esophagitis: Secondary | ICD-10-CM | POA: Diagnosis not present

## 2021-03-06 DIAGNOSIS — J3089 Other allergic rhinitis: Secondary | ICD-10-CM | POA: Diagnosis not present

## 2021-03-06 DIAGNOSIS — I10 Essential (primary) hypertension: Secondary | ICD-10-CM | POA: Diagnosis not present

## 2021-03-06 DIAGNOSIS — M9905 Segmental and somatic dysfunction of pelvic region: Secondary | ICD-10-CM | POA: Diagnosis not present

## 2021-03-06 DIAGNOSIS — M1711 Unilateral primary osteoarthritis, right knee: Secondary | ICD-10-CM | POA: Diagnosis not present

## 2021-03-06 DIAGNOSIS — M9903 Segmental and somatic dysfunction of lumbar region: Secondary | ICD-10-CM | POA: Diagnosis not present

## 2021-03-06 DIAGNOSIS — M5116 Intervertebral disc disorders with radiculopathy, lumbar region: Secondary | ICD-10-CM | POA: Diagnosis not present

## 2021-03-06 DIAGNOSIS — J301 Allergic rhinitis due to pollen: Secondary | ICD-10-CM | POA: Diagnosis not present

## 2021-03-06 DIAGNOSIS — E782 Mixed hyperlipidemia: Secondary | ICD-10-CM | POA: Diagnosis not present

## 2021-03-06 DIAGNOSIS — E039 Hypothyroidism, unspecified: Secondary | ICD-10-CM | POA: Diagnosis not present

## 2021-03-06 DIAGNOSIS — J45909 Unspecified asthma, uncomplicated: Secondary | ICD-10-CM | POA: Diagnosis not present

## 2021-03-06 DIAGNOSIS — M25551 Pain in right hip: Secondary | ICD-10-CM | POA: Diagnosis not present

## 2021-03-06 DIAGNOSIS — M1990 Unspecified osteoarthritis, unspecified site: Secondary | ICD-10-CM | POA: Diagnosis not present

## 2021-03-11 DIAGNOSIS — M25551 Pain in right hip: Secondary | ICD-10-CM | POA: Diagnosis not present

## 2021-03-11 DIAGNOSIS — M9903 Segmental and somatic dysfunction of lumbar region: Secondary | ICD-10-CM | POA: Diagnosis not present

## 2021-03-11 DIAGNOSIS — M5116 Intervertebral disc disorders with radiculopathy, lumbar region: Secondary | ICD-10-CM | POA: Diagnosis not present

## 2021-03-11 DIAGNOSIS — M9905 Segmental and somatic dysfunction of pelvic region: Secondary | ICD-10-CM | POA: Diagnosis not present

## 2021-03-20 DIAGNOSIS — J3089 Other allergic rhinitis: Secondary | ICD-10-CM | POA: Diagnosis not present

## 2021-03-20 DIAGNOSIS — J301 Allergic rhinitis due to pollen: Secondary | ICD-10-CM | POA: Diagnosis not present

## 2021-03-26 DIAGNOSIS — Z23 Encounter for immunization: Secondary | ICD-10-CM | POA: Diagnosis not present

## 2021-04-08 DIAGNOSIS — G4733 Obstructive sleep apnea (adult) (pediatric): Secondary | ICD-10-CM | POA: Diagnosis not present

## 2021-04-11 DIAGNOSIS — J301 Allergic rhinitis due to pollen: Secondary | ICD-10-CM | POA: Diagnosis not present

## 2021-04-11 DIAGNOSIS — J3089 Other allergic rhinitis: Secondary | ICD-10-CM | POA: Diagnosis not present

## 2021-05-11 DIAGNOSIS — Z20822 Contact with and (suspected) exposure to covid-19: Secondary | ICD-10-CM | POA: Diagnosis not present

## 2021-05-12 DIAGNOSIS — J45909 Unspecified asthma, uncomplicated: Secondary | ICD-10-CM | POA: Diagnosis not present

## 2021-05-12 DIAGNOSIS — I10 Essential (primary) hypertension: Secondary | ICD-10-CM | POA: Diagnosis not present

## 2021-05-12 DIAGNOSIS — M1990 Unspecified osteoarthritis, unspecified site: Secondary | ICD-10-CM | POA: Diagnosis not present

## 2021-05-12 DIAGNOSIS — E039 Hypothyroidism, unspecified: Secondary | ICD-10-CM | POA: Diagnosis not present

## 2021-05-12 DIAGNOSIS — K219 Gastro-esophageal reflux disease without esophagitis: Secondary | ICD-10-CM | POA: Diagnosis not present

## 2021-05-12 DIAGNOSIS — M1711 Unilateral primary osteoarthritis, right knee: Secondary | ICD-10-CM | POA: Diagnosis not present

## 2021-05-12 DIAGNOSIS — E782 Mixed hyperlipidemia: Secondary | ICD-10-CM | POA: Diagnosis not present

## 2021-05-13 DIAGNOSIS — R200 Anesthesia of skin: Secondary | ICD-10-CM | POA: Diagnosis not present

## 2021-05-13 DIAGNOSIS — J45909 Unspecified asthma, uncomplicated: Secondary | ICD-10-CM | POA: Diagnosis not present

## 2021-05-13 DIAGNOSIS — H02402 Unspecified ptosis of left eyelid: Secondary | ICD-10-CM | POA: Diagnosis not present

## 2021-05-13 DIAGNOSIS — R7303 Prediabetes: Secondary | ICD-10-CM | POA: Diagnosis not present

## 2021-05-13 DIAGNOSIS — M5432 Sciatica, left side: Secondary | ICD-10-CM | POA: Diagnosis not present

## 2021-05-13 DIAGNOSIS — I10 Essential (primary) hypertension: Secondary | ICD-10-CM | POA: Diagnosis not present

## 2021-05-13 DIAGNOSIS — M1990 Unspecified osteoarthritis, unspecified site: Secondary | ICD-10-CM | POA: Diagnosis not present

## 2021-05-13 DIAGNOSIS — M5431 Sciatica, right side: Secondary | ICD-10-CM | POA: Diagnosis not present

## 2021-05-13 DIAGNOSIS — J3089 Other allergic rhinitis: Secondary | ICD-10-CM | POA: Diagnosis not present

## 2021-05-13 DIAGNOSIS — E782 Mixed hyperlipidemia: Secondary | ICD-10-CM | POA: Diagnosis not present

## 2021-05-13 DIAGNOSIS — J301 Allergic rhinitis due to pollen: Secondary | ICD-10-CM | POA: Diagnosis not present

## 2021-05-13 DIAGNOSIS — M5412 Radiculopathy, cervical region: Secondary | ICD-10-CM | POA: Diagnosis not present

## 2021-05-13 DIAGNOSIS — K219 Gastro-esophageal reflux disease without esophagitis: Secondary | ICD-10-CM | POA: Diagnosis not present

## 2021-05-13 DIAGNOSIS — E039 Hypothyroidism, unspecified: Secondary | ICD-10-CM | POA: Diagnosis not present

## 2021-05-24 ENCOUNTER — Other Ambulatory Visit: Payer: Self-pay

## 2021-05-24 ENCOUNTER — Other Ambulatory Visit: Payer: Self-pay | Admitting: Family Medicine

## 2021-05-24 ENCOUNTER — Ambulatory Visit
Admission: RE | Admit: 2021-05-24 | Discharge: 2021-05-24 | Disposition: A | Discharge status: Home / Self Care | Payer: Medicare Other | Source: Ambulatory Visit | Attending: Family Medicine | Admitting: Family Medicine

## 2021-05-24 DIAGNOSIS — M545 Low back pain, unspecified: Secondary | ICD-10-CM

## 2021-05-24 DIAGNOSIS — J3089 Other allergic rhinitis: Secondary | ICD-10-CM | POA: Diagnosis not present

## 2021-05-24 DIAGNOSIS — J301 Allergic rhinitis due to pollen: Secondary | ICD-10-CM | POA: Diagnosis not present

## 2021-06-18 DIAGNOSIS — J301 Allergic rhinitis due to pollen: Secondary | ICD-10-CM | POA: Diagnosis not present

## 2021-06-18 DIAGNOSIS — J3089 Other allergic rhinitis: Secondary | ICD-10-CM | POA: Diagnosis not present

## 2021-06-20 DIAGNOSIS — I10 Essential (primary) hypertension: Secondary | ICD-10-CM | POA: Diagnosis not present

## 2021-06-20 DIAGNOSIS — M5432 Sciatica, left side: Secondary | ICD-10-CM | POA: Diagnosis not present

## 2021-06-20 DIAGNOSIS — K219 Gastro-esophageal reflux disease without esophagitis: Secondary | ICD-10-CM | POA: Diagnosis not present

## 2021-06-20 DIAGNOSIS — M1990 Unspecified osteoarthritis, unspecified site: Secondary | ICD-10-CM | POA: Diagnosis not present

## 2021-06-20 DIAGNOSIS — E039 Hypothyroidism, unspecified: Secondary | ICD-10-CM | POA: Diagnosis not present

## 2021-06-20 DIAGNOSIS — M5431 Sciatica, right side: Secondary | ICD-10-CM | POA: Diagnosis not present

## 2021-06-20 DIAGNOSIS — J45909 Unspecified asthma, uncomplicated: Secondary | ICD-10-CM | POA: Diagnosis not present

## 2021-06-20 DIAGNOSIS — M5416 Radiculopathy, lumbar region: Secondary | ICD-10-CM | POA: Diagnosis not present

## 2021-06-20 DIAGNOSIS — R200 Anesthesia of skin: Secondary | ICD-10-CM | POA: Diagnosis not present

## 2021-06-20 DIAGNOSIS — R269 Unspecified abnormalities of gait and mobility: Secondary | ICD-10-CM | POA: Diagnosis not present

## 2021-06-20 DIAGNOSIS — E782 Mixed hyperlipidemia: Secondary | ICD-10-CM | POA: Diagnosis not present

## 2021-06-20 DIAGNOSIS — R7303 Prediabetes: Secondary | ICD-10-CM | POA: Diagnosis not present

## 2021-06-21 DIAGNOSIS — J301 Allergic rhinitis due to pollen: Secondary | ICD-10-CM | POA: Diagnosis not present

## 2021-06-21 DIAGNOSIS — J3089 Other allergic rhinitis: Secondary | ICD-10-CM | POA: Diagnosis not present

## 2021-06-26 DIAGNOSIS — R5381 Other malaise: Secondary | ICD-10-CM | POA: Diagnosis not present

## 2021-06-26 DIAGNOSIS — M6281 Muscle weakness (generalized): Secondary | ICD-10-CM | POA: Diagnosis not present

## 2021-06-26 DIAGNOSIS — R293 Abnormal posture: Secondary | ICD-10-CM | POA: Diagnosis not present

## 2021-06-26 DIAGNOSIS — R269 Unspecified abnormalities of gait and mobility: Secondary | ICD-10-CM | POA: Diagnosis not present

## 2021-07-01 DIAGNOSIS — J301 Allergic rhinitis due to pollen: Secondary | ICD-10-CM | POA: Diagnosis not present

## 2021-07-01 DIAGNOSIS — J3089 Other allergic rhinitis: Secondary | ICD-10-CM | POA: Diagnosis not present

## 2021-07-03 DIAGNOSIS — R293 Abnormal posture: Secondary | ICD-10-CM | POA: Diagnosis not present

## 2021-07-03 DIAGNOSIS — M6281 Muscle weakness (generalized): Secondary | ICD-10-CM | POA: Diagnosis not present

## 2021-07-03 DIAGNOSIS — R5381 Other malaise: Secondary | ICD-10-CM | POA: Diagnosis not present

## 2021-07-03 DIAGNOSIS — R269 Unspecified abnormalities of gait and mobility: Secondary | ICD-10-CM | POA: Diagnosis not present

## 2021-07-04 DIAGNOSIS — K219 Gastro-esophageal reflux disease without esophagitis: Secondary | ICD-10-CM | POA: Diagnosis not present

## 2021-07-04 DIAGNOSIS — M1711 Unilateral primary osteoarthritis, right knee: Secondary | ICD-10-CM | POA: Diagnosis not present

## 2021-07-04 DIAGNOSIS — E039 Hypothyroidism, unspecified: Secondary | ICD-10-CM | POA: Diagnosis not present

## 2021-07-04 DIAGNOSIS — R269 Unspecified abnormalities of gait and mobility: Secondary | ICD-10-CM | POA: Diagnosis not present

## 2021-07-04 DIAGNOSIS — M6281 Muscle weakness (generalized): Secondary | ICD-10-CM | POA: Diagnosis not present

## 2021-07-04 DIAGNOSIS — J301 Allergic rhinitis due to pollen: Secondary | ICD-10-CM | POA: Diagnosis not present

## 2021-07-04 DIAGNOSIS — I10 Essential (primary) hypertension: Secondary | ICD-10-CM | POA: Diagnosis not present

## 2021-07-04 DIAGNOSIS — J4540 Moderate persistent asthma, uncomplicated: Secondary | ICD-10-CM | POA: Diagnosis not present

## 2021-07-04 DIAGNOSIS — R293 Abnormal posture: Secondary | ICD-10-CM | POA: Diagnosis not present

## 2021-07-04 DIAGNOSIS — M1990 Unspecified osteoarthritis, unspecified site: Secondary | ICD-10-CM | POA: Diagnosis not present

## 2021-07-04 DIAGNOSIS — R5381 Other malaise: Secondary | ICD-10-CM | POA: Diagnosis not present

## 2021-07-04 DIAGNOSIS — E782 Mixed hyperlipidemia: Secondary | ICD-10-CM | POA: Diagnosis not present

## 2021-07-04 DIAGNOSIS — J3089 Other allergic rhinitis: Secondary | ICD-10-CM | POA: Diagnosis not present

## 2021-07-16 ENCOUNTER — Emergency Department (HOSPITAL_BASED_OUTPATIENT_CLINIC_OR_DEPARTMENT_OTHER)
Admission: EM | Admit: 2021-07-16 | Discharge: 2021-07-17 | Payer: Medicare Other | Attending: Emergency Medicine | Admitting: Emergency Medicine

## 2021-07-16 ENCOUNTER — Other Ambulatory Visit: Payer: Self-pay

## 2021-07-16 ENCOUNTER — Emergency Department (HOSPITAL_BASED_OUTPATIENT_CLINIC_OR_DEPARTMENT_OTHER): Payer: Medicare Other

## 2021-07-16 ENCOUNTER — Encounter (HOSPITAL_BASED_OUTPATIENT_CLINIC_OR_DEPARTMENT_OTHER): Payer: Self-pay

## 2021-07-16 DIAGNOSIS — Y900 Blood alcohol level of less than 20 mg/100 ml: Secondary | ICD-10-CM | POA: Diagnosis not present

## 2021-07-16 DIAGNOSIS — F22 Delusional disorders: Secondary | ICD-10-CM

## 2021-07-16 DIAGNOSIS — R9431 Abnormal electrocardiogram [ECG] [EKG]: Secondary | ICD-10-CM | POA: Diagnosis not present

## 2021-07-16 DIAGNOSIS — R4182 Altered mental status, unspecified: Secondary | ICD-10-CM | POA: Diagnosis not present

## 2021-07-16 DIAGNOSIS — Z20822 Contact with and (suspected) exposure to covid-19: Secondary | ICD-10-CM | POA: Diagnosis not present

## 2021-07-16 DIAGNOSIS — J301 Allergic rhinitis due to pollen: Secondary | ICD-10-CM | POA: Diagnosis not present

## 2021-07-16 DIAGNOSIS — Z0389 Encounter for observation for other suspected diseases and conditions ruled out: Secondary | ICD-10-CM | POA: Diagnosis not present

## 2021-07-16 DIAGNOSIS — J3089 Other allergic rhinitis: Secondary | ICD-10-CM | POA: Diagnosis not present

## 2021-07-16 DIAGNOSIS — Z7982 Long term (current) use of aspirin: Secondary | ICD-10-CM | POA: Diagnosis not present

## 2021-07-16 DIAGNOSIS — F209 Schizophrenia, unspecified: Secondary | ICD-10-CM | POA: Diagnosis not present

## 2021-07-16 DIAGNOSIS — F600 Paranoid personality disorder: Secondary | ICD-10-CM | POA: Diagnosis not present

## 2021-07-16 LAB — COMPREHENSIVE METABOLIC PANEL
ALT: 23 U/L (ref 0–44)
AST: 18 U/L (ref 15–41)
Albumin: 4.7 g/dL (ref 3.5–5.0)
Alkaline Phosphatase: 61 U/L (ref 38–126)
Anion gap: 9 (ref 5–15)
BUN: 14 mg/dL (ref 8–23)
CO2: 28 mmol/L (ref 22–32)
Calcium: 8.9 mg/dL (ref 8.9–10.3)
Chloride: 103 mmol/L (ref 98–111)
Creatinine, Ser: 0.83 mg/dL (ref 0.44–1.00)
GFR, Estimated: >60 mL/min (ref >60)
Glucose, Bld: 120 mg/dL — ABNORMAL HIGH (ref 70–99)
Potassium: 3.4 mmol/L — ABNORMAL LOW (ref 3.5–5.1)
Sodium: 140 mmol/L (ref 135–145)
Total Bilirubin: 0.3 mg/dL (ref 0.3–1.2)
Total Protein: 7.5 g/dL (ref 6.5–8.1)

## 2021-07-16 LAB — URINALYSIS, ROUTINE W REFLEX MICROSCOPIC
Bilirubin Urine: NEGATIVE
Glucose, UA: NEGATIVE mg/dL
Ketones, ur: NEGATIVE mg/dL
Leukocytes,Ua: NEGATIVE
Nitrite: NEGATIVE
Protein, ur: NEGATIVE mg/dL
Specific Gravity, Urine: <1.005 — ABNORMAL LOW (ref 1.005–1.030)
pH: 6 (ref 5.0–8.0)

## 2021-07-16 LAB — CBC WITH DIFFERENTIAL/PLATELET
Abs Immature Granulocytes: 0.04 10*3/uL (ref 0.00–0.07)
Basophils Absolute: 0.1 10*3/uL (ref 0.0–0.1)
Basophils Relative: 1 %
Eosinophils Absolute: 0.2 10*3/uL (ref 0.0–0.5)
Eosinophils Relative: 2 %
HCT: 39.8 % (ref 36.0–46.0)
Hemoglobin: 13.3 g/dL (ref 12.0–15.0)
Immature Granulocytes: 0 %
Lymphocytes Relative: 26 %
Lymphs Abs: 2.6 10*3/uL (ref 0.7–4.0)
MCH: 29.2 pg (ref 26.0–34.0)
MCHC: 33.4 g/dL (ref 30.0–36.0)
MCV: 87.5 fL (ref 80.0–100.0)
Monocytes Absolute: 1 10*3/uL (ref 0.1–1.0)
Monocytes Relative: 10 %
Neutro Abs: 6.2 10*3/uL (ref 1.7–7.7)
Neutrophils Relative %: 61 %
Platelets: 332 10*3/uL (ref 150–400)
RBC: 4.55 MIL/uL (ref 3.87–5.11)
RDW: 13.1 % (ref 11.5–15.5)
WBC: 10.1 10*3/uL (ref 4.0–10.5)
nRBC: 0 % (ref 0.0–0.2)

## 2021-07-16 LAB — RAPID URINE DRUG SCREEN, HOSP PERFORMED
Amphetamines: NOT DETECTED
Barbiturates: NOT DETECTED
Benzodiazepines: NOT DETECTED
Cocaine: NOT DETECTED
Opiates: NOT DETECTED
Tetrahydrocannabinol: NOT DETECTED

## 2021-07-16 NOTE — BH Assessment (Signed)
Clinician reviewed pt's chart in preparation to complete her MH Assessment. However, there is no EDP note and, thus, pt is not yet medically cleared. TTS to attempt to complete assessment at a later time.
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
## 2021-07-16 NOTE — ED Provider Notes (Signed)
[place] [facility] EMERGENCY DEPT Provider Note   CSN: [id number] Arrival date & time: 07/16/21  2056     History  Chief Complaint  Patient presents with   Poisoning   Delusional    Becky Davis is a 71 y.o. female.  71 yo F with a cc of increased agitation and hallucinations.  This apparently is not abnormal for her.  The patient is on Abilify and it stopped taking her medication for about 7 months and then just resumed about a week ago.  This medication was supposed to be increased and it sounds like the patient was not totally on board.  She tells me her primary complaint is she thinks someone has broken into her house and stolen her medicines and replace it with something that would make her sick.  She does endorse hallucinations otherwise.  Her daughter is here with her and endorses that she has never required hospitalization for mental illness.  The history is provided by the patient.  Illness Severity:  Moderate Onset quality:  Gradual Duration:  1 week Timing:  Constant Progression:  Worsening Chronicity:  New Associated symptoms: no chest pain, no congestion, no fever, no headaches, no myalgias, no nausea, no rhinorrhea, no shortness of breath, no vomiting and no wheezing       Home Medications Prior to Admission medications   Medication Sig Start Date End Date Taking? Authorizing Provider  ARIPiprazole (ABILIFY) 5 MG tablet Take 5 mg by mouth at bedtime.    Yes [provider]  calcitRIOL (ROCALTROL) 0.25 MCG capsule Take 0.25 mcg by mouth daily. 12/01/19  Yes [provider]  gabapentin (NEURONTIN) 100 MG capsule Take 100 mg by mouth 3 (three) times daily.   Yes [provider]  hydrALAZINE (APRESOLINE) 10 MG tablet Take 5 mg by mouth 3 (three) times daily.   Yes [provider]  montelukast (SINGULAIR) 10 MG tablet Take 10 mg by mouth at bedtime.   Yes [provider]  albuterol (PROAIR HFA) 108 (90 Base) MCG/ACT  inhaler Inhale 2 puffs into the lungs as needed (Shortness of breath). 03/30/19   Martyn Ehrich, NP  albuterol (PROVENTIL) (2.5 MG/3ML) 0.083% nebulizer solution Take 2.5 mg by nebulization every 4 (four) hours as needed for wheezing or shortness of breath.    [provider]  aspirin EC 325 MG tablet Take 1 tablet (325 mg total) by mouth 2 (two) times daily. 12/20/19   Ventura Bruns, PA-C  baclofen (LIORESAL) 10 MG tablet Take 1 tablet (10 mg total) by mouth 3 (three) times daily. As needed for muscle spasm Patient taking differently: Take 10 mg by mouth 2 (two) times daily. As needed for muscle spasm 12/20/19   Merlene Pulling K, PA-C  budesonide-formoterol [facility]) 160-4.5 MCG/ACT inhaler Inhale 2 puffs into the lungs 2 (two) times daily. 08/06/18   Mannam, Hart Robinsons, MD  Calcium Carbonate-Vitamin D (CALTRATE 600+D PO) Take 500 mg by mouth 2 (two) times daily.     [provider]  cetirizine (ZYRTEC) 10 MG tablet Take 10 mg by mouth daily. 10/12/19   [provider]  HYDROcodone-acetaminophen (NORCO) 10-325 MG tablet Take 1 tablet by mouth every 6 (six) hours as needed. 12/20/19   Ventura Bruns, PA-C  levothyroxine (SYNTHROID, LEVOTHROID) 100 MCG tablet Take 100 mcg by mouth daily before breakfast.    [provider]  losartan (COZAAR) 100 MG tablet Take 0.5 tablets (50 mg total) by mouth daily. 12/21/19   Merlene Pulling  K, PA-C  PARoxetine (PAXIL) 20 MG tablet Take 20 mg by mouth at bedtime.     [provider]  sennosides-docusate sodium (SENOKOT-S) 8.6-50 MG tablet Take 2 tablets by mouth daily. 12/20/19   Ventura Bruns, PA-C      Allergies    Lisinopril, Prednisone, Juniper oil, and Penicillins    Review of Systems   Review of Systems  Constitutional:  Negative for chills and fever.  HENT:  Negative for congestion and rhinorrhea.   Eyes:  Negative for redness and visual disturbance.  Respiratory:  Negative for shortness of breath and wheezing.    Cardiovascular:  Negative for chest pain and palpitations.  Gastrointestinal:  Negative for nausea and vomiting.  Genitourinary:  Negative for dysuria and urgency.  Musculoskeletal:  Negative for arthralgias and myalgias.  Skin:  Negative for pallor and wound.  Neurological:  Negative for dizziness and headaches.  Psychiatric/Behavioral:  Positive for agitation.    Physical Exam Updated Vital Signs BP (!) 147/82 (BP Location: Right Arm)    Pulse 91    Temp 98.2 F (36.8 C) (Oral)    Resp 18    Ht 5\' 3"  (1.6 m)    Wt 87.5 kg    SpO2 96%    BMI 34.19 kg/m  Physical Exam Vitals and nursing note reviewed.  Constitutional:      General: She is not in acute distress.    Appearance: She is well-developed. She is not diaphoretic.  HENT:     Head: Normocephalic and atraumatic.  Eyes:     Pupils: Pupils are equal, round, and reactive to light.  Cardiovascular:     Rate and Rhythm: Normal rate and regular rhythm.     Heart sounds: No murmur heard.   No friction rub. No gallop.  Pulmonary:     Effort: Pulmonary effort is normal.     Breath sounds: No wheezing or rales.  Abdominal:     General: There is distension.     Palpations: Abdomen is soft.     Tenderness: There is no abdominal tenderness.  Musculoskeletal:        General: No tenderness.     Cervical back: Normal range of motion and neck supple.  Skin:    General: Skin is warm and dry.  Neurological:     Mental Status: She is alert and oriented to person, place, and time.  Psychiatric:        Behavior: Behavior normal.    ED Results / Procedures / Treatments   Labs (all labs ordered are listed, but only abnormal results are displayed) Labs Reviewed  COMPREHENSIVE METABOLIC PANEL - Abnormal; Notable for the following components:      Result Value   Potassium 3.4 (*)    Glucose, Bld 120 (*)    All other components within normal limits  SALICYLATE LEVEL - Abnormal; Notable for the following components:   Salicylate Lvl  <7.1 (*)    All other components within normal limits  ACETAMINOPHEN LEVEL - Abnormal; Notable for the following components:   Acetaminophen (Tylenol), Serum <10 (*)    All other components within normal limits  URINALYSIS, ROUTINE W REFLEX MICROSCOPIC - Abnormal; Notable for the following components:   Color, Urine COLORLESS (*)    Specific Gravity, Urine <1.005 (*)    Hgb urine dipstick TRACE (*)    Bacteria, UA RARE (*)    All other components within normal limits  RESP PANEL BY RT-PCR (FLU A&B, COVID) ARPGX2  ETHANOL  RAPID URINE DRUG SCREEN, HOSP PERFORMED  CBC WITH DIFFERENTIAL/PLATELET    EKG EKG Interpretation  Date/Time:  Tuesday July 16 2021 21:24:42 EST Ventricular Rate:  76 PR Interval:    QRS Duration: 91 QT Interval:  403 QTC Calculation: 449 R Axis:   -32 Text Interpretation: Normal sinus rhythm Low voltage, precordial leads Left ventricular hypertrophy No significant change since last tracing Confirmed by Deno Etienne [phone]) on 07/16/2021 11:14:21 PM  Radiology DG Chest Port 1 View  Result Date: 07/16/2021 CLINICAL DATA:  Possible overdose. EXAM: PORTABLE CHEST 1 VIEW COMPARISON:  Chest x-ray 07/10/2017. FINDINGS: The heart size and mediastinal contours are within normal limits. Both lungs are clear. The visualized skeletal structures are unremarkable. IMPRESSION: No active disease. Electronically Signed   By: Ronney Asters M.D.   On: 07/16/2021 22:57    Procedures Procedures    Medications Ordered in ED Medications - No data to display  ED Course/ Medical Decision Making/ A&P                           Medical Decision Making  71 yo F with a cc of hallucinations.  Patient concerned she may die from poisoning.  Wants labs performed.  Will have TTS eval.   Signed out to Dr. Betsey Holiday, please see his note for further details of care in the ED.  The patients results and plan were reviewed and discussed.   Any x-rays performed were independently reviewed by  myself.   Differential diagnosis were considered with the presenting HPI.  Medications - No data to display  Vitals:   07/17/21 1115 07/17/21 1230 07/17/21 1533 07/17/21 1922  BP: (!) 142/74 (!) 142/74 (!) 156/95 (!) 147/82  Pulse: 72 72 86 91  Resp: 20 20 20 18   Temp:  97.9 F (36.6 C) (!) 97.5 F (36.4 C) 98.2 F (36.8 C)  TempSrc:   Oral Oral  SpO2: 97% 97% 96% 96%  Weight:      Height:        Final diagnoses:  AMS (altered mental status)  Paranoid delusion ([place])          Final Clinical Impression(s) / ED Diagnoses Final diagnoses:  AMS (altered mental status)  Paranoid delusion [facility])    Rx / DC Orders ED Discharge Orders     None         Deno Etienne, DO 07/17/21 2132

## 2021-07-17 ENCOUNTER — Encounter: Payer: Self-pay | Admitting: Psychiatry

## 2021-07-17 ENCOUNTER — Inpatient Hospital Stay
Admission: AD | Admit: 2021-07-17 | Discharge: 2021-08-06 | DRG: 885 | Disposition: A | Discharge status: Home / Self Care | Payer: Medicare Other | Source: Intra-hospital | Attending: Psychiatry | Admitting: Psychiatry

## 2021-07-17 DIAGNOSIS — Z8616 Personal history of COVID-19: Secondary | ICD-10-CM

## 2021-07-17 DIAGNOSIS — Z20822 Contact with and (suspected) exposure to covid-19: Secondary | ICD-10-CM | POA: Diagnosis present

## 2021-07-17 DIAGNOSIS — F322 Major depressive disorder, single episode, severe without psychotic features: Principal | ICD-10-CM | POA: Diagnosis present

## 2021-07-17 DIAGNOSIS — F209 Schizophrenia, unspecified: Secondary | ICD-10-CM | POA: Diagnosis not present

## 2021-07-17 DIAGNOSIS — F22 Delusional disorders: Secondary | ICD-10-CM | POA: Diagnosis present

## 2021-07-17 DIAGNOSIS — F332 Major depressive disorder, recurrent severe without psychotic features: Secondary | ICD-10-CM | POA: Diagnosis not present

## 2021-07-17 DIAGNOSIS — Z7982 Long term (current) use of aspirin: Secondary | ICD-10-CM | POA: Diagnosis not present

## 2021-07-17 DIAGNOSIS — R440 Auditory hallucinations: Secondary | ICD-10-CM | POA: Diagnosis not present

## 2021-07-17 DIAGNOSIS — R4182 Altered mental status, unspecified: Secondary | ICD-10-CM | POA: Diagnosis not present

## 2021-07-17 DIAGNOSIS — Z96651 Presence of right artificial knee joint: Secondary | ICD-10-CM | POA: Diagnosis present

## 2021-07-17 DIAGNOSIS — G4700 Insomnia, unspecified: Secondary | ICD-10-CM | POA: Diagnosis present

## 2021-07-17 DIAGNOSIS — F600 Paranoid personality disorder: Secondary | ICD-10-CM | POA: Diagnosis not present

## 2021-07-17 DIAGNOSIS — F323 Major depressive disorder, single episode, severe with psychotic features: Secondary | ICD-10-CM | POA: Diagnosis present

## 2021-07-17 DIAGNOSIS — R413 Other amnesia: Secondary | ICD-10-CM

## 2021-07-17 DIAGNOSIS — G3184 Mild cognitive impairment, so stated: Secondary | ICD-10-CM | POA: Diagnosis present

## 2021-07-17 DIAGNOSIS — Z7951 Long term (current) use of inhaled steroids: Secondary | ICD-10-CM | POA: Diagnosis not present

## 2021-07-17 DIAGNOSIS — F32A Depression, unspecified: Secondary | ICD-10-CM | POA: Diagnosis present

## 2021-07-17 LAB — ETHANOL: Alcohol, Ethyl (B): <10 mg/dL (ref <10)

## 2021-07-17 LAB — SALICYLATE LEVEL: Salicylate Lvl: <7 mg/dL — ABNORMAL LOW (ref 7.0–30.0)

## 2021-07-17 LAB — ACETAMINOPHEN LEVEL: Acetaminophen (Tylenol), Serum: <10 ug/mL — ABNORMAL LOW (ref 10–30)

## 2021-07-17 LAB — RESP PANEL BY RT-PCR (FLU A&B, COVID) ARPGX2
Influenza A by PCR: NEGATIVE
Influenza B by PCR: NEGATIVE
SARS Coronavirus 2 by RT PCR: NEGATIVE

## 2021-07-17 MED ORDER — ARIPIPRAZOLE 5 MG PO TABS
5.0000 mg | ORAL_TABLET | Freq: Every day | ORAL | Status: DC
  Filled 2021-07-17: qty 1

## 2021-07-17 MED ORDER — MAGNESIUM HYDROXIDE 400 MG/5ML PO SUSP: 30.0000 mL | Freq: Every day | ORAL | Status: DC | PRN

## 2021-07-17 MED ORDER — ARIPIPRAZOLE 5 MG PO TABS: 10.0000 mg | ORAL_TABLET | Freq: Every day | ORAL | Status: DC

## 2021-07-17 MED ORDER — ALBUTEROL SULFATE (2.5 MG/3ML) 0.083% IN NEBU: 2.5000 mg | INHALATION_SOLUTION | RESPIRATORY_TRACT | Status: DC | PRN

## 2021-07-17 MED ORDER — LOSARTAN POTASSIUM 25 MG PO TABS: 50.0000 mg | ORAL_TABLET | Freq: Every day | ORAL | Status: DC

## 2021-07-17 MED ORDER — FLUTICASONE FUROATE-VILANTEROL 200-25 MCG/ACT IN AEPB
1.0000 | INHALATION_SPRAY | Freq: Every day | RESPIRATORY_TRACT | Status: DC
Start: 2021-07-17 — End: 2021-07-17

## 2021-07-17 MED ORDER — LEVOTHYROXINE SODIUM 100 MCG PO TABS
100.0000 ug | ORAL_TABLET | Freq: Every day | ORAL | Status: DC
  Administered 2021-07-19 – 2021-08-06 (×17): 100 ug via ORAL
  Filled 2021-07-17 (×20): qty 1

## 2021-07-17 MED ORDER — GABAPENTIN 100 MG PO CAPS: 100.0000 mg | ORAL_CAPSULE | Freq: Three times a day (TID) | ORAL | Status: DC

## 2021-07-17 MED ORDER — PAROXETINE HCL 20 MG PO TABS
20.0000 mg | ORAL_TABLET | Freq: Every day | ORAL | Status: DC
  Administered 2021-07-18 – 2021-07-23 (×5): 20 mg via ORAL
  Filled 2021-07-17 (×8): qty 1

## 2021-07-17 MED ORDER — ALBUTEROL SULFATE HFA 108 (90 BASE) MCG/ACT IN AERS: 2.0000 | INHALATION_SPRAY | RESPIRATORY_TRACT | Status: DC | PRN

## 2021-07-17 MED ORDER — LEVOTHYROXINE SODIUM 100 MCG PO TABS
100.0000 ug | ORAL_TABLET | Freq: Every day | ORAL | Status: DC
  Administered 2021-07-17: 100 ug via ORAL
  Filled 2021-07-17: qty 1

## 2021-07-17 MED ORDER — LORATADINE 10 MG PO TABS
10.0000 mg | ORAL_TABLET | Freq: Every day | ORAL | Status: DC
  Administered 2021-07-18 – 2021-08-06 (×17): 10 mg via ORAL
  Filled 2021-07-17 (×20): qty 1

## 2021-07-17 MED ORDER — HALOPERIDOL LACTATE 5 MG/ML IJ SOLN: 5.0000 mg | INTRAMUSCULAR | Status: DC | PRN

## 2021-07-17 MED ORDER — HYDRALAZINE HCL 10 MG PO TABS
5.0000 mg | ORAL_TABLET | Freq: Three times a day (TID) | ORAL | Status: DC
  Administered 2021-07-18: 5 mg via ORAL
  Filled 2021-07-17 (×6): qty 1

## 2021-07-17 MED ORDER — ALBUTEROL SULFATE (2.5 MG/3ML) 0.083% IN NEBU
2.5000 mg | INHALATION_SOLUTION | RESPIRATORY_TRACT | Status: DC | PRN
  Administered 2021-07-30: 2.5 mg via RESPIRATORY_TRACT
  Filled 2021-07-17 (×2): qty 3

## 2021-07-17 MED ORDER — GABAPENTIN 100 MG PO CAPS
100.0000 mg | ORAL_CAPSULE | Freq: Three times a day (TID) | ORAL | Status: DC
  Administered 2021-07-17 – 2021-07-21 (×9): 100 mg via ORAL
  Filled 2021-07-17 (×12): qty 1

## 2021-07-17 MED ORDER — MONTELUKAST SODIUM 10 MG PO TABS
10.0000 mg | ORAL_TABLET | Freq: Every day | ORAL | Status: DC
  Administered 2021-07-18 – 2021-08-05 (×17): 10 mg via ORAL
  Filled 2021-07-17 (×21): qty 1

## 2021-07-17 MED ORDER — HYDRALAZINE HCL 10 MG PO TABS: 5.0000 mg | ORAL_TABLET | Freq: Three times a day (TID) | ORAL | Status: DC

## 2021-07-17 MED ORDER — ALUM & MAG HYDROXIDE-SIMETH 200-200-20 MG/5ML PO SUSP: 30.0000 mL | ORAL | Status: DC | PRN

## 2021-07-17 MED ORDER — TRAZODONE HCL 50 MG PO TABS
50.0000 mg | ORAL_TABLET | Freq: Every evening | ORAL | Status: DC | PRN
  Administered 2021-07-18 – 2021-07-21 (×3): 50 mg via ORAL
  Filled 2021-07-17 (×5): qty 1

## 2021-07-17 MED ORDER — LOSARTAN POTASSIUM 25 MG PO TABS
50.0000 mg | ORAL_TABLET | Freq: Every day | ORAL | Status: DC
  Administered 2021-07-17 – 2021-07-22 (×6): 50 mg via ORAL
  Filled 2021-07-17 (×6): qty 2

## 2021-07-17 MED ORDER — ACETAMINOPHEN 325 MG PO TABS
650.0000 mg | ORAL_TABLET | Freq: Four times a day (QID) | ORAL | Status: DC | PRN
  Administered 2021-07-19: 650 mg via ORAL
  Administered 2021-07-23: 325 mg via ORAL
  Administered 2021-07-28 – 2021-08-06 (×4): 650 mg via ORAL
  Filled 2021-07-17 (×7): qty 2

## 2021-07-17 NOTE — BH Assessment (Signed)
Patient is to be admitted to [facility] Unit today 07/17/21 by Dr.  Louis Meckel .  Attending Physician will be Dr.  Louis Meckel .   Patient has been assigned to room L-28, by [facility] Charge Nurse, Estill Bamberg.     ER staff is aware of the admission: Seth Bake, Patient Access.

## 2021-07-17 NOTE — Tx Team (Addendum)
Initial Treatment Plan 07/17/2021 4:21 PM Becky Davis HQI:[id number]   PATIENT STRESSORS: Medication noncompliance Auditory hallucinations  PATIENT STRENGTHS: Supportive family/friends    PATIENT IDENTIFIED PROBLEMS: Medication adjustment  Lack of sleep                   DISCHARGE CRITERIA:  Ability to meet basic life and health needs Improved stabilization in mood, thinking, and/or behavior Need for constant or close observation no longer present  PRELIMINARY DISCHARGE PLAN: Outpatient therapy Return to previous living arrangement  PATIENT/FAMILY INVOLVEMENT: This treatment plan has been presented to and reviewed with the patient, Becky Davis.  The patient has been given the opportunity to ask questions and make suggestions.  Sunday Spillers, RN 07/17/2021, 4:21 PM

## 2021-07-17 NOTE — ED Notes (Signed)
[facility] at bedside for assessment

## 2021-07-17 NOTE — ED Notes (Signed)
Patient remains very delusional and paranoid.  Not cooperative with transporting to hospital.   MD aware.

## 2021-07-17 NOTE — BH Assessment (Signed)
TTS attempted to complete MH assessment, there is still on EDP note, pt is not medically cleared. TTS to attempt to complete consult at a later time.

## 2021-07-17 NOTE — Progress Notes (Signed)
71 year old female admitted to the IVC from [place] accompanied by sheriff with the admitting diagnosis of schizophrenia. Patient is alert and oriented x4 with mild confusion. Patient is paranoid, delusional and irritable at this time. Patient stated "someone is stealing money from me, not my daughter".  When ask the reason for this visit patient stated " to get some rest". Patient report being able to sleep 8 to 9 hours before October. Has been sleeping less than 2 hrs at night since then. Patient also stated " cousins poison her ". Patient report hearing voices unable to say what the voices are telling her. Also report that her Abilify and gabapentin is not working. Body assessment done skin intact. Patient report history of sciatica but denies pain at this time. Orientation provided to the unit. Remain safe on the unit with q15 minute safety check.

## 2021-07-17 NOTE — ED Provider Notes (Signed)
Work-up from previous provider has been reviewed.  All labs reviewed and are reassuring.  Patient with hallucinations and paranoia.  Will require evaluation for possible [facility] psych treatment.  Patient medically clear for psychiatric evaluation.   Orpah Greek, MD 07/17/21 [phone]

## 2021-07-17 NOTE — Progress Notes (Signed)
Patient refused supper only drink the milk and went back to her room.
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
## 2021-07-17 NOTE — ED Provider Notes (Signed)
Emergency Medicine Observation Re-evaluation Note  Becky Davis is a 71 y.o. female, seen on rounds today.  Pt initially presented to the ED for complaints of Poisoning and Delusional Currently, the patient is hallucinating with auditory hallucinations, delusional, paranoid.  Physical Exam  BP (!) 163/78    Pulse 74    Temp 97.9 F (36.6 C) (Oral)    Resp 16    Ht 5\' 3"  (1.6 m)    Wt 87.5 kg    SpO2 99%    BMI 34.19 kg/m  Physical Exam General: Mildly agitated in the exam room, looking around frequently, no aggressive behavior Cardiac: Well perfused Lungs: Even and unlabored Psych: Active auditory hallucinations, hearing things coming out of the TV which is currently off, delusional, paranoid, worried about being poisoned.  ED Course / MDM  EKG:EKG Interpretation  Date/Time:  Tuesday July 16 2021 21:24:42 EST Ventricular Rate:  76 PR Interval:    QRS Duration: 91 QT Interval:  403 QTC Calculation: 025 R Axis:   -32 Text Interpretation: Normal sinus rhythm Low voltage, precordial leads Left ventricular hypertrophy No significant change since last tracing Confirmed by Deno Etienne [phone]) on 07/16/2021 11:14:21 PM  I have reviewed the labs performed to date as well as medications administered while in observation.  Recent changes in the last 24 hours include patient presented to the emergency department and evaluated by psychiatry, deemed to meet inpatient criteria for [facility] psych placement.  I had an extensive conversation bedside for around 10 minutes with the patient and the patient's daughter.  Her daughter was concerned because she did not sleep at all last night.  Her home medications were reordered.  She had reportedly been cutting back on her home Abilify.  She endorses persistent paranoia.   Patient was initially voluntary but became persistently paranoid, actively hallucinating with auditory hallucinations. Patient was very unlikely to cooperate with staff for inpatient  hospitalization voluntarily and family increasingly concerned regarding her condition. IVC paperwork filed. Bed availability at [facility] inpatient psychiatric unit noted, accepting physician Dr Louis Meckel. EMTALA completed and the patient was subsequently transferred to [facility] for inpatient care by PD. Family updated bedside.  Plan  Current plan is for inpatient [facility] psych.  Social work Corporate investment banker.  Becky Davis is not under involuntary commitment.     [name], MD 07/17/21 [name]

## 2021-07-18 DIAGNOSIS — F332 Major depressive disorder, recurrent severe without psychotic features: Secondary | ICD-10-CM

## 2021-07-18 MED ORDER — LORAZEPAM 1 MG PO TABS: 1.0000 mg | ORAL_TABLET | Freq: Once | ORAL | Status: AC

## 2021-07-18 MED ORDER — LORAZEPAM 2 MG/ML IJ SOLN
1.0000 mg | Freq: Once | INTRAMUSCULAR | Status: AC
  Administered 2021-07-18: 1 mg via INTRAMUSCULAR
  Filled 2021-07-18 (×2): qty 0.5

## 2021-07-18 MED ORDER — ARIPIPRAZOLE 5 MG PO TABS
5.0000 mg | ORAL_TABLET | ORAL | Status: DC
  Administered 2021-07-18 – 2021-07-19 (×2): 5 mg via ORAL
  Filled 2021-07-18 (×2): qty 1

## 2021-07-18 MED ORDER — OLANZAPINE 10 MG IM SOLR
5.0000 mg | Freq: Once | INTRAMUSCULAR | Status: AC
  Administered 2021-07-18: 5 mg via INTRAMUSCULAR
  Filled 2021-07-18: qty 10

## 2021-07-18 MED ORDER — QUETIAPINE FUMARATE 100 MG PO TABS
100.0000 mg | ORAL_TABLET | Freq: Every day | ORAL | Status: DC
  Administered 2021-07-19: 100 mg via ORAL
  Filled 2021-07-18 (×2): qty 1
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
## 2021-07-18 NOTE — [facility] Suicide Risk Assessment (Signed)
[facility] INPATIENT:  Family/Significant Other Suicide Prevention Education  Suicide Prevention Education: SPE completed with pt, as pt refused to consent to family contact. SPI pamphlet provided to pt and pt was encouraged to share information with support network, ask questions, and talk about any concerns relating to SPE. Pt denies access to guns/firearms and verbalized understanding of information provided. Mobile Crisis information also provided to pt.  Patient Refusal for Family/Significant Other Suicide Prevention Education: The patient Becky Davis has refused to provide written consent for family/significant other to be provided Family/Significant Other Suicide Prevention Education during admission and/or prior to discharge.  Physician notified.  Tomoya Ringwald A Martin[place] 07/18/2021, 12:33 PM

## 2021-07-18 NOTE — Progress Notes (Signed)
At the beginning of the shift patient observed standing in front of the nurses station. Patient is alert and oriented, but paranoid and delusional. During assessment patient refused to answer questions. But observed looking at the ceiling talking to self. Patient refused to go in the day room area. Patient stated " I prefer to be an in area by myself, I believe some people hear have COVID". Patient was reassured that no one here has COVID. Patient refused to eat breakfast this morning due to her belief of being poison. Writer offer resident offered resident ensure, nutrigrain bar and bottle water which she tolerated well. Patient continue to pace around the unit, refused to have a seat despite encouragement. Patient continue to pace around and responding to internal stimuli.

## 2021-07-18 NOTE — Progress Notes (Signed)
Patient is currently in bed sleeping and snoring. no apparent distress noted. Q15 minute safety maintained, patient remain safe on the unit.

## 2021-07-18 NOTE — Progress Notes (Signed)
Pt has been up since 1130 pm in front of the nursing station, asking for her clothes and to leave. She is psychotic and responding to internal stimuli, taking to herself, looking around talking to people we can't see. She has made statements about me having a gun, paranoid about her medication and refusing to take her medication. She has been in and out of her room, and standing in front of the nursing station. She repeats she has call 911 to get her and report [place] to the authorities.

## 2021-07-18 NOTE — H&P (Signed)
Psychiatric Admission Assessment Adult  Patient Identification: Becky Davis MRN:  [id number] Date of Evaluation:  07/18/2021 Chief Complaint:  MDD (major depressive disorder), recurrent severe, without psychosis ([place]) [F33.2]  Principal Diagnosis: Unspecified Schizophrenia or other psychotic disorder.  Diagnosis:  Principal Problem:   MDD (major depressive disorder), recurrent severe, without psychosis ([place]) Active Problems:   Depression  History of Present Illness: Becky Davis is a 71 year old white female who presented to [facility] in [place] with her daughter with a chief complaint of auditory and visual hallucinations.  She tells me that she is not sleeping well because the machines in her bedroom keep talking to her.  She says that she cannot stop listening to the machines.  She is able to tell me that she had COVID last March and stopped taking her medication at that time.  She sees Dr. Toy Care in [place] and is supposed to be taking Paxil and Abilify.  She denies being depressed or suicidal.  She does admit to auditory and visual hallucinations.  She is married and has 3 grown children.  She has never been psychiatrically hospitalized in the past.  No history of suicide attempts.  PER INITIAL INTAKE: 71 yo F with a cc of increased agitation and hallucinations.  This apparently is not abnormal for her.  The patient is on Abilify and it stopped taking her medication for about 7 months and then just resumed about a week ago.  This medication was supposed to be increased and it sounds like the patient was not totally on board.  She tells me her primary complaint is she thinks someone has broken into her house and stolen her medicines and replace it with something that would make her sick.  She does endorse hallucinations otherwise.  Associated Signs/Symptoms: Depression Symptoms:  None Duration of Depression Symptoms: Less than two weeks  (Hypo) Manic Symptoms:   Delusions, Distractibility, Flight of Ideas, Hallucinations, Irritable Mood,  Psychotic Symptoms:  Delusions, Hallucinations: Auditory Visual Paranoia, PTSD Symptoms:No  Total Time spent with patient: 1 hour  Past Psychiatric History: She has been seeing Dr. Toy Care for the past 15 years and has never been psychiatrically hospitalized.  Is the patient at risk to self? No.  Has the patient been a risk to self in the past 6 months? Yes.    Has the patient been a risk to self within the distant past? No.  Is the patient a risk to others? No.  Has the patient been a risk to others in the past 6 months? No.  Has the patient been a risk to others within the distant past? No.   Prior Inpatient Therapy:  No Prior Outpatient Therapy:  Yes  Alcohol Screening: 1. How often do you have a drink containing alcohol?: Never 2. How many drinks containing alcohol do you have on a typical day when you are drinking?: 1 or 2 3. How often do you have six or more drinks on one occasion?: Never AUDIT-C Score: 0 4. How often during the last year have you found that you were not able to stop drinking once you had started?: Never 5. How often during the last year have you failed to do what was normally expected from you because of drinking?: Never 6. How often during the last year have you needed a first drink in the morning to get yourself going after a heavy drinking session?: Never 7. How often during the last year have you had a feeling of guilt of remorse after  drinking?: Never 8. How often during the last year have you been unable to remember what happened the night before because you had been drinking?: Never 9. Have you or someone else been injured as a result of your drinking?: No 10. Has a relative or friend or a doctor or another health worker been concerned about your drinking or suggested you cut down?: No Alcohol Use Disorder Identification Test Final Score (AUDIT): 0 Substance Abuse History in  the last 12 months:  No. Consequences of Substance Abuse: NA Previous Psychotropic Medications: Yes  Psychological Evaluations: No  Past Medical History:  Past Medical History:  Diagnosis Date   Arthritis    knees   Asthma    Headache    hx of 20 years ago   Heart murmur    hx of 10-15 years ago   Hypertension    Hypothyroidism    Pneumonia    Pre-diabetes    Thyroid nodule     Past Surgical History:  Procedure Laterality Date   ABDOMINAL HYSTERECTOMY     thyroid goiter     surgery   THYROID LOBECTOMY     06-25-17 Dr. Harlow Asa   Left   THYROID LOBECTOMY Left 06/25/2017   Procedure: LEFT THYROID LOBECTOMY;  Surgeon: Armandina Gemma, MD;  Location: [facility];  Service: General;  Laterality: Left;   TOTAL KNEE ARTHROPLASTY Right 12/20/2019   Procedure: TOTAL KNEE ARTHROPLASTY;  Surgeon: Marchia Bond, MD;  Location: [facility];  Service: Orthopedics;  Laterality: Right;   Family History:  Family History  Problem Relation Age of Onset   Emphysema Mother    Cancer Mother    Heart disease Mother    Cancer Sister    Cancer Brother    Family Psychiatric  History: Unknown Tobacco Screening:   Social History:  Social History   Substance and Sexual Activity  Alcohol Use No     Social History   Substance and Sexual Activity  Drug Use No    Additional Social History:                           Allergies:   Allergies  Allergen Reactions   Lisinopril Other (See Comments)   Prednisone     Had depression in 1991 after extended use of predisone. Pt has taken this medication since in a short time period, and has had no reaction.    Juniper Oil Rash   Penicillins Rash    Patient received ancef on 12/20/19 with no adverse reaction.    Lab Results:  Results for orders placed or performed during the hospital encounter of 07/16/21 (from the past 48 hour(s))  Resp Panel by RT-PCR (Flu A&B, Covid) Nasopharyngeal Swab     Status: None   Collection Time: 07/16/21 10:24 PM    Specimen: Nasopharyngeal Swab; Nasopharyngeal(NP) swabs in vial transport medium  Result Value Ref Range   SARS Coronavirus 2 by RT PCR NEGATIVE NEGATIVE    Comment: (NOTE) SARS-CoV-2 target nucleic acids are NOT DETECTED.  The SARS-CoV-2 RNA is generally detectable in upper respiratory specimens during the acute phase of infection. The lowest concentration of SARS-CoV-2 viral copies this assay can detect is 138 copies/mL. A negative result does not preclude SARS-Cov-2 infection and should not be used as the sole basis for treatment or other patient management decisions. A negative result may occur with  improper specimen collection/handling, submission of specimen other than nasopharyngeal swab, presence of viral mutation(s) within  the areas targeted by this assay, and inadequate number of viral copies(<138 copies/mL). A negative result must be combined with clinical observations, patient history, and epidemiological information. The expected result is Negative.  Fact Sheet for Patients:  EntrepreneurPulse.com.au  Fact Sheet for Healthcare Providers:  IncredibleEmployment.be  This test is no t yet approved or cleared by the [place] FDA and  has been authorized for detection and/or diagnosis of SARS-CoV-2 by FDA under an Emergency Use Authorization (EUA). This EUA will remain  in effect (meaning this test can be used) for the duration of the COVID-19 declaration under Section 564(b)(1) of the Act, 21 U.S.C.section 360bbb-3(b)(1), unless the authorization is terminated  or revoked sooner.       Influenza A by PCR NEGATIVE NEGATIVE   Influenza B by PCR NEGATIVE NEGATIVE    Comment: (NOTE) The Xpert Xpress SARS-CoV-2/FLU/RSV plus assay is intended as an aid in the diagnosis of influenza from Nasopharyngeal swab specimens and should not be used as a sole basis for treatment. Nasal washings and aspirates are unacceptable for Xpert Xpress  SARS-CoV-2/FLU/RSV testing.  Fact Sheet for Patients: EntrepreneurPulse.com.au  Fact Sheet for Healthcare Providers: IncredibleEmployment.be  This test is not yet approved or cleared by the [place] FDA and has been authorized for detection and/or diagnosis of SARS-CoV-2 by FDA under an Emergency Use Authorization (EUA). This EUA will remain in effect (meaning this test can be used) for the duration of the COVID-19 declaration under Section 564(b)(1) of the Act, 21 U.S.C. section 360bbb-3(b)(1), unless the authorization is terminated or revoked.  Performed at [facility], [address], [place], [place] [address]   Comprehensive metabolic panel     Status: Abnormal   Collection Time: 07/16/21 10:24 PM  Result Value Ref Range   Sodium 140 135 - 145 mmol/L   Potassium 3.4 (L) 3.5 - 5.1 mmol/L   Chloride 103 98 - 111 mmol/L   CO2 28 22 - 32 mmol/L   Glucose, Bld 120 (H) 70 - 99 mg/dL    Comment: Glucose reference range applies only to samples taken after fasting for at least 8 hours.   BUN 14 8 - 23 mg/dL   Creatinine, Ser 0.83 0.44 - 1.00 mg/dL   Calcium 8.9 8.9 - 10.3 mg/dL   Total Protein 7.5 6.5 - 8.1 g/dL   Albumin 4.7 3.5 - 5.0 g/dL   AST 18 15 - 41 U/L   ALT 23 0 - 44 U/L   Alkaline Phosphatase 61 38 - 126 U/L   Total Bilirubin 0.3 0.3 - 1.2 mg/dL   GFR, Estimated >60 >60 mL/min    Comment: (NOTE) Calculated using the CKD-EPI Creatinine Equation (2021)    Anion gap 9 5 - 15    Comment: Performed at [facility], [address] [place], [place] [address]  Ethanol     Status: None   Collection Time: 07/16/21 10:24 PM  Result Value Ref Range   Alcohol, Ethyl (B) <10 <10 mg/dL    Comment: (NOTE) Lowest detectable limit for serum alcohol is 10 mg/dL.  For medical purposes only. Performed at [facility], [address] [place], [place] [address] CORRECTED ON  01/04 AT 0015: PREVIOUSLY REPORTED AS <10   Urine rapid drug screen (hosp performed)     Status: None   Collection Time: 07/16/21 10:24 PM  Result Value Ref Range   Opiates NONE DETECTED NONE DETECTED   Cocaine NONE DETECTED NONE DETECTED   Benzodiazepines NONE DETECTED  NONE DETECTED   Amphetamines NONE DETECTED NONE DETECTED   Tetrahydrocannabinol NONE DETECTED NONE DETECTED   Barbiturates NONE DETECTED NONE DETECTED    Comment: (NOTE) DRUG SCREEN FOR MEDICAL PURPOSES ONLY.  IF CONFIRMATION IS NEEDED FOR ANY PURPOSE, NOTIFY LAB WITHIN 5 DAYS.  LOWEST DETECTABLE LIMITS FOR URINE DRUG SCREEN Drug Class                     Cutoff (ng/mL) Amphetamine and metabolites    1000 Barbiturate and metabolites    200 Benzodiazepine                 712 Tricyclics and metabolites     300 Opiates and metabolites        300 Cocaine and metabolites        300 THC                            50 Performed at [facility], [address] [place], [place] [address]   CBC with Diff     Status: None   Collection Time: 07/16/21 10:24 PM  Result Value Ref Range   WBC 10.1 4.0 - 10.5 K/uL   RBC 4.55 3.87 - 5.11 MIL/uL   Hemoglobin 13.3 12.0 - 15.0 g/dL   HCT 39.8 36.0 - 46.0 %   MCV 87.5 80.0 - 100.0 fL   MCH 29.2 26.0 - 34.0 pg   MCHC 33.4 30.0 - 36.0 g/dL   RDW 13.1 11.5 - 15.5 %   Platelets 332 150 - 400 K/uL   nRBC 0.0 0.0 - 0.2 %   Neutrophils Relative % 61 %   Neutro Abs 6.2 1.7 - 7.7 K/uL   Lymphocytes Relative 26 %   Lymphs Abs 2.6 0.7 - 4.0 K/uL   Monocytes Relative 10 %   Monocytes Absolute 1.0 0.1 - 1.0 K/uL   Eosinophils Relative 2 %   Eosinophils Absolute 0.2 0.0 - 0.5 K/uL   Basophils Relative 1 %   Basophils Absolute 0.1 0.0 - 0.1 K/uL   Immature Granulocytes 0 %   Abs Immature Granulocytes 0.04 0.00 - 0.07 K/uL    Comment: Performed at [facility], [place], [place] [address]  Salicylate level     Status: Abnormal    Collection Time: 07/16/21 10:24 PM  Result Value Ref Range   Salicylate Lvl <2.5 (L) 7.0 - 30.0 mg/dL    Comment: Performed at [facility], [address], [place], [place] [address]  Acetaminophen level     Status: Abnormal   Collection Time: 07/16/21 10:24 PM  Result Value Ref Range   Acetaminophen (Tylenol), Serum <10 (L) 10 - 30 ug/mL    Comment: (NOTE) Therapeutic concentrations vary significantly. A range of 10-30 ug/mL  may be an effective concentration for many patients. However, some  are best treated at concentrations outside of this range. Acetaminophen concentrations >150 ug/mL at 4 hours after ingestion  and >50 ug/mL at 12 hours after ingestion are often associated with  toxic reactions.  Performed at [facility], [address] [place], [place] [address]   Urinalysis, Routine w reflex microscopic     Status: Abnormal   Collection Time: 07/16/21 10:24 PM  Result Value Ref Range   Color, Urine COLORLESS (A) YELLOW   APPearance CLEAR CLEAR   Specific Gravity, Urine <1.005 (L) 1.005 - 1.030   pH 6.0 5.0 - 8.0  Glucose, UA NEGATIVE NEGATIVE mg/dL   Hgb urine dipstick TRACE (A) NEGATIVE   Bilirubin Urine NEGATIVE NEGATIVE   Ketones, ur NEGATIVE NEGATIVE mg/dL   Protein, ur NEGATIVE NEGATIVE mg/dL   Nitrite NEGATIVE NEGATIVE   Leukocytes,Ua NEGATIVE NEGATIVE   WBC, UA 0-5 0 - 5 WBC/hpf   Bacteria, UA RARE (A) NONE SEEN   Squamous Epithelial / LPF 0-5 0 - 5    Comment: Performed at [facility], [address] [place], [place] [address]    Blood Alcohol level:  Lab Results  Component Value Date   ETH <10 62/09/5595    Metabolic Disorder Labs:  Lab Results  Component Value Date   HGBA1C 5.8 (H) 12/13/2019   MPG 119.76 12/13/2019   No results found for: PROLACTIN No results found for: CHOL, TRIG, HDL, CHOLHDL, VLDL, LDLCALC  Current Medications: Current Facility-Administered Medications   Medication Dose Route Frequency Provider Last Rate Last Admin   acetaminophen (TYLENOL) tablet 650 mg  650 mg Oral Q6H PRN Parks Ranger, DO       albuterol (PROVENTIL) (2.5 MG/3ML) 0.083% nebulizer solution 2.5 mg  2.5 mg Nebulization Q4H PRN Parks Ranger, DO       alum & mag hydroxide-simeth (MAALOX/MYLANTA) 200-200-20 MG/5ML suspension 30 mL  30 mL Oral Q4H PRN Parks Ranger, DO       ARIPiprazole (ABILIFY) tablet 5 mg  5 mg Oral QHS Parks Ranger, DO       gabapentin (NEURONTIN) capsule 100 mg  100 mg Oral TID Parks Ranger, DO   100 mg at 07/17/21 1735   hydrALAZINE (APRESOLINE) tablet 5 mg  5 mg Oral TID Parks Ranger, DO   5 mg at 07/18/21 0910   levothyroxine (SYNTHROID) tablet 100 mcg  100 mcg Oral QAC breakfast Parks Ranger, DO       loratadine (CLARITIN) tablet 10 mg  10 mg Oral Daily Parks Ranger, DO       losartan (COZAAR) tablet 50 mg  50 mg Oral Daily Parks Ranger, DO   50 mg at 07/17/21 1735   magnesium hydroxide (MILK OF MAGNESIA) suspension 30 mL  30 mL Oral Daily PRN Parks Ranger, DO       montelukast (SINGULAIR) tablet 10 mg  10 mg Oral QHS Parks Ranger, DO       PARoxetine (PAXIL) tablet 20 mg  20 mg Oral QHS Parks Ranger, DO       traZODone (DESYREL) tablet 50 mg  50 mg Oral QHS PRN Parks Ranger, DO   50 mg at 07/18/21 0444   PTA Medications: Medications Prior to Admission  Medication Sig Dispense Refill Last Dose   albuterol (PROAIR HFA) 108 (90 Base) MCG/ACT inhaler Inhale 2 puffs into the lungs as needed (Shortness of breath). 6.7 g 3 07/17/2021   albuterol (PROVENTIL) (2.5 MG/3ML) 0.083% nebulizer solution Take 2.5 mg by nebulization every 4 (four) hours as needed for wheezing or shortness of breath.   07/17/2021   ARIPiprazole (ABILIFY) 5 MG tablet Take 5 mg by mouth at bedtime.    07/17/2021   budesonide-formoterol (SYMBICORT) 160-4.5  MCG/ACT inhaler Inhale 2 puffs into the lungs 2 (two) times daily. 1 Inhaler 6 07/17/2021   calcitRIOL (ROCALTROL) 0.25 MCG capsule Take 0.25 mcg by mouth daily.   07/17/2021   Calcium Carbonate-Vitamin D (CALTRATE 600+D PO) Take 500 mg by mouth 2 (two) times daily.    07/17/2021  cetirizine (ZYRTEC) 10 MG tablet Take 10 mg by mouth daily.   07/17/2021   gabapentin (NEURONTIN) 100 MG capsule Take 100 mg by mouth 3 (three) times daily.   07/17/2021   hydrALAZINE (APRESOLINE) 10 MG tablet Take 5 mg by mouth 3 (three) times daily.   07/17/2021   levothyroxine (SYNTHROID, LEVOTHROID) 100 MCG tablet Take 100 mcg by mouth daily before breakfast.   07/17/2021   losartan (COZAAR) 100 MG tablet Take 0.5 tablets (50 mg total) by mouth daily. 5 tablet 0 07/17/2021   montelukast (SINGULAIR) 10 MG tablet Take 10 mg by mouth at bedtime.   07/17/2021   PARoxetine (PAXIL) 20 MG tablet Take 20 mg by mouth at bedtime.    07/17/2021   aspirin EC 325 MG tablet Take 1 tablet (325 mg total) by mouth 2 (two) times daily. 60 tablet 0    baclofen (LIORESAL) 10 MG tablet Take 1 tablet (10 mg total) by mouth 3 (three) times daily. As needed for muscle spasm (Patient taking differently: Take 10 mg by mouth 2 (two) times daily. As needed for muscle spasm) 50 tablet 0    HYDROcodone-acetaminophen (NORCO) 10-325 MG tablet Take 1 tablet by mouth every 6 (six) hours as needed. 28 tablet 0    sennosides-docusate sodium (SENOKOT-S) 8.6-50 MG tablet Take 2 tablets by mouth daily. 30 tablet 1     Musculoskeletal: Strength & Muscle Tone: within normal limits Gait & Station: normal Patient leans: N/A            Psychiatric Specialty Exam:  Presentation  General Appearance: No data recorded Eye Contact:No data recorded Speech:No data recorded Speech Volume:No data recorded Handedness:No data recorded  Mood and Affect  Mood:No data recorded Affect:No data recorded  Thought Process  Thought Processes:No data recorded Duration of  Psychotic Symptoms: Greater than six months  Past Diagnosis of Schizophrenia or Psychoactive disorder: Yes  Descriptions of Associations:No data recorded Orientation:No data recorded Thought Content:No data recorded Hallucinations:No data recorded Ideas of Reference:No data recorded Suicidal Thoughts:No data recorded Homicidal Thoughts:No data recorded  Sensorium  Memory:No data recorded Judgment:No data recorded Insight:No data recorded  Executive Functions  Concentration:No data recorded Attention Span:No data recorded Recall:No data recorded Fund of Knowledge:No data recorded Language:No data recorded  Psychomotor Activity  Psychomotor Activity:No data recorded  Assets  Assets:No data recorded  Sleep  Sleep:No data recorded   Physical Exam: Physical Exam Constitutional:      Appearance: Normal appearance.  HENT:     Head: Normocephalic and atraumatic.     Mouth/Throat:     Pharynx: Oropharynx is clear.  Eyes:     Pupils: Pupils are equal, round, and reactive to light.  Cardiovascular:     Rate and Rhythm: Normal rate and regular rhythm.  Pulmonary:     Effort: Pulmonary effort is normal.     Breath sounds: Normal breath sounds.  Abdominal:     General: Abdomen is flat.     Palpations: Abdomen is soft.  Musculoskeletal:        General: Normal range of motion.  Skin:    General: Skin is warm and dry.  Neurological:     General: No focal deficit present.     Mental Status: She is alert. Mental status is at baseline.  Psychiatric:        Attention and Perception: She perceives auditory and visual hallucinations.        Mood and Affect: Mood is anxious. Affect is inappropriate.  Speech: Speech is tangential.        Behavior: Behavior is cooperative.        Thought Content: Thought content is paranoid and delusional.        Cognition and Memory: Cognition is impaired. Memory is impaired.        Judgment: Judgment is inappropriate.   Review of  Systems  Constitutional: Negative.   HENT: Negative.    Eyes: Negative.   Respiratory: Negative.    Cardiovascular: Negative.   Gastrointestinal: Negative.   Genitourinary: Negative.   Musculoskeletal: Negative.   Skin: Negative.   Neurological: Negative.   Endo/Heme/Allergies: Negative.   Psychiatric/Behavioral:  Positive for hallucinations.   Blood pressure (!) 128/58, pulse 89, temperature 97.8 F (36.6 C), temperature source Oral, resp. rate 20, height 5\' 3"  (1.6 m), weight 87.5 kg, SpO2 95 %. Body mass index is 34.17 kg/m.  Treatment Plan Summary: Daily contact with patient to assess and evaluate symptoms and progress in treatment, Medication management, and Plan restart her home medications and see orders.  Observation Level/Precautions:  15 minute checks  Laboratory:  CBC Chemistry Profile HbAIC UA  Psychotherapy:    Medications:    Consultations:    Discharge Concerns:    Estimated LOS:  Other:     Physician Treatment Plan for Primary Diagnosis: Unspecified Schizophrenia or other psychotic disorder  Long Term Goal(s): Improvement in symptoms so as ready for discharge  Short Term Goals: Ability to identify changes in lifestyle to reduce recurrence of condition will improve, Ability to verbalize feelings will improve, Ability to disclose and discuss suicidal ideas, Ability to demonstrate self-control will improve, Ability to identify and develop effective coping behaviors will improve, Ability to maintain clinical measurements within normal limits will improve, Compliance with prescribed medications will improve, and Ability to identify triggers associated with substance abuse/mental health issues will improve  Physician Treatment Plan for Secondary Diagnosis: Principal Problem:   MDD (major depressive disorder), recurrent severe, without psychosis ([place]) Active Problems:   Depression    I certify that inpatient services furnished can reasonably be expected to  improve the patient's condition.    Parks Ranger, DO 1/5/202310:18 AM

## 2021-07-18 NOTE — Progress Notes (Signed)
Recreation Therapy Notes   Date: 07/18/2021  Time: 1:15 pm    Location: Court yard    Behavioral response: N/A   Intervention Topic: Social Skills   Discussion/Intervention: Patient did not attend group.  Clinical Observations/Feedback:  Patient did not attend group.   Kesha Hurrell LRT/CTRS        Kyriana Yankee 07/18/2021 1:55 PM

## 2021-07-18 NOTE — Progress Notes (Signed)
At the beginning of the shift, the patient was visiting with her husband the dayroom. At the visit the patient went to her room and slept for a while. Around 10 pm  the pt was up standing beside her window. She told me. "Leave now" while pointing at me. She angrily started to walk toward me. After I left the room she stood in front of the door, peeping out occasionally. Around 1130 pm pt came out of her room to the nursing station. She was paranoid, delusional and responding to internal stimuli. Her thought were disorganized and she was making hand gestures and strangle facial expressions. She wants to leave. She made these statements, " I am waiting for the governor. I called  911. I am unarmed, you might lose your job. I need the phones turned on right now. Stop the electronic chatter. Their has been 5 attempts on my life. She has been talking to herself and she does not make any sense.  She continues to stand in front of the nursing station, holding a sticker with two dogs on it. She stated the sticker shows that she is under the protection of [place]. Pt said she needs her hearing aides.

## 2021-07-18 NOTE — Progress Notes (Signed)
Patient was circling the nurses station, requesting to call the governor and president Biden. Patient became agitated, was also actively hallucinating, and demanding to be released from the hospital. Staff attempted to redirect the patient, call her husband, and was offered snacks and fluids. Patient was not able to be redirected. MD was notified and PRN medications were ordered. Patient was offered medications. Patient refused. Patient was asked to go to her room for medication administration. Patient refused again. Two staff members escorted patient to her room. Upper arm shoulder hold was used on both arms for medication administration. PRN Zyprexa and Ativan were given for agitation.   07/18/21 1320  Description of events or circumstances leading to restrictive event  Precipitating circumstances leading to onset of behavior Patient hallucinating, paranoid, unable to be redirected  Patient Behavior Refusing Medication;Threatening Posture/Words  Clinical Justification for Restrictive Event  Imminent danger to: Others  Patient is restrained for the purpose of administering medication Yes  Less Restrictive Interventions Tried Prior to Seclusion/Restraint  Comfort Interventions used prior to seclusion or restraint Food/Drink  Therapeutic Interventions used prior to seclusion or restraint Redirection;Show of Support;Verbal Deescalation  Diversionary Interventions used prior to seclusion or restraint Other (Comment) (verbal)  Other  Interventions used prior to seclusion or restraint Other (Comment) (verbal)  Patient's response to Less Restrictive Intervention Increase in Behavior  Interventions used in Restrictive Event Upper Arm/ Forearm escort to patient room;Upper Arm Shoulder Hold  RN Initiating Seclusion or Restraint Estill Bamberg, RN  Date Seclusion or Restraint initiated 07/18/21  Time Seclusion or Restraint Initiated 1320  Restriction order entered into order management Yes  Criteria for release  from seclusion or restraint Positive Response to Medication Resulting in Pt's ability to manage behavior  Patient informed of Criteria for Release? Yes  Admission Assessment & Health History reviewed & considered prior to intervention? Yes  Pre-existing medical conditions, disabilities, or limitations that place pt at greater risk for seclusion or restraint? High Blood Pressure;Asthma  What alternative measures/modifications were used for pre-existing medical conditions, disabilities, or limitations? Redirection, verbal deescalation  Health Status prior to intervention No problems noted  [place] Notification  [facility] Nurse Administrator on call notified Yes  Name of [facility] Nurse Administrator on call notified Warner Mccreedy, RN  Date [facility] Nurse Administrator notified 07/18/21  Time [facility] Nurse Administrator notified 1320  Reason [facility] Administration on call notified Seclusion or Restraint Episode  Release from Seclusion or Restraint  Upon release the following goals were achieved: Medication Administered  Seclusion/Restraint 15-minute checks  Signs of injury with seclusion/restraint No  Food offered (offer hourly and prn) No - Patient too agitated  Fluid offered (offer hourly and prn) No - Patient too agitated  Meets Criteria for Release Yes (released from manual hold after medication administered)  Patient helped to meet criteria for release No  Privacy maintained Yes  Monitored 1:1 No  Hygiene/Toileting No (Comment) (patient independent)  Respiratory Status Easy and unlabored  Circulation/Skin check Normal color  Passive Range of Motion if in 4-point restraints Not applicable  Physical status/Comfort Normal movement  Psychological Status/Comfort Alert  Behavior Yelling/screaming  Restraint Status Not applicable  Seclusion Status Not applicable  Manual Hold Status Manual_hold
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
## 2021-07-19 DIAGNOSIS — F332 Major depressive disorder, recurrent severe without psychotic features: Secondary | ICD-10-CM | POA: Diagnosis not present

## 2021-07-19 LAB — LIPID PANEL
Cholesterol: 178 mg/dL (ref 0–200)
HDL: 49 mg/dL (ref >40)
LDL Cholesterol: 112 mg/dL — ABNORMAL HIGH (ref 0–99)
Total CHOL/HDL Ratio: 3.6 RATIO
Triglycerides: 85 mg/dL (ref <150)
VLDL: 17 mg/dL (ref 0–40)

## 2021-07-19 LAB — HEMOGLOBIN A1C
Hgb A1c MFr Bld: 5.4 % (ref 4.8–5.6)
Mean Plasma Glucose: 108.28 mg/dL

## 2021-07-19 LAB — TSH: TSH: 2.041 u[IU]/mL (ref 0.350–4.500)

## 2021-07-19 MED ORDER — ARIPIPRAZOLE 5 MG PO TABS
10.0000 mg | ORAL_TABLET | ORAL | Status: DC
  Administered 2021-07-19 – 2021-07-23 (×8): 10 mg via ORAL
  Filled 2021-07-19 (×8): qty 2

## 2021-07-19 NOTE — Progress Notes (Signed)
Recreation Therapy Notes   Date: 07/19/2021  Time: 1:15pm    Location: Craft room    Behavioral response: Appropriate  Intervention Topic: Wellness    Discussion/Intervention:  Group content today was focused on Wellness. The group defined wellness and some positive ways they make decisions for themselves. Individuals expressed reasons why they neglected any wellness in the past. Patients described ways to improve wellness skills in the future. The group explained what could happen if they did not do any wellness at all. Participants express how bad choices has affected them and others around them. Individual explained the importance of wellness. The group participated in the intervention Testing my Wellness where they had a chance to identify some of their weaknesses and strengths in wellness.  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 She stated that vitamin C and moderation helps with wellness. Participant expressed that wellness is important because it will help you live a longer life and be an asset to the community and others. Individual engaged in the intervention and was social with staff and peers.  Bronislaus Verdell LRT/CTRS         Devlon Dosher 07/19/2021 2:59 PM
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
## 2021-07-19 NOTE — Progress Notes (Signed)
Patient is alert and oriented x4. Pt complained of not being able to hear too well because she does not have her hearing aids. Pt also complained of having frequent bowel movements counting four today. Pt denies feelings of anxiety, depression, SI, HI, and AVH at this time. Pt is observed pacing the hallways and when asked she said she is wanting to walk 5 miles a day. Pt c/o left knee pain rating 4/10. Pt remains safe on the unit at this time.

## 2021-07-19 NOTE — BH IP Treatment Plan (Signed)
Interdisciplinary Treatment and Diagnostic Plan Update  07/19/2021 Time of Session: 10:30AM Becky Davis MRN: [id number]  Principal Diagnosis: MDD (major depressive disorder), recurrent severe, without psychosis ([place])  Secondary Diagnoses: Principal Problem:   MDD (major depressive disorder), recurrent severe, without psychosis ([place]) Active Problems:   Depression   Current Medications:  Current Facility-Administered Medications  Medication Dose Route Frequency Provider Last Rate Last Admin   acetaminophen (TYLENOL) tablet 650 mg  650 mg Oral Q6H PRN Parks Ranger, DO       albuterol (PROVENTIL) (2.5 MG/3ML) 0.083% nebulizer solution 2.5 mg  2.5 mg Nebulization Q4H PRN Parks Ranger, DO       alum & mag hydroxide-simeth (MAALOX/MYLANTA) 200-200-20 MG/5ML suspension 30 mL  30 mL Oral Q4H PRN Parks Ranger, DO       ARIPiprazole (ABILIFY) tablet 10 mg  10 mg Oral [id number] Herrick, Richard Edward, DO       gabapentin (NEURONTIN) capsule 100 mg  100 mg Oral TID Parks Ranger, DO   100 mg at 07/19/21 0916   levothyroxine (SYNTHROID) tablet 100 mcg  100 mcg Oral QAC breakfast Parks Ranger, DO   100 mcg at 07/19/21 4970   loratadine (CLARITIN) tablet 10 mg  10 mg Oral Daily Parks Ranger, DO   10 mg at 07/19/21 0916   losartan (COZAAR) tablet 50 mg  50 mg Oral Daily Parks Ranger, DO   50 mg at 07/19/21 2637   magnesium hydroxide (MILK OF MAGNESIA) suspension 30 mL  30 mL Oral Daily PRN Parks Ranger, DO       montelukast (SINGULAIR) tablet 10 mg  10 mg Oral QHS Parks Ranger, DO   10 mg at 07/18/21 2054   PARoxetine (PAXIL) tablet 20 mg  20 mg Oral QHS Parks Ranger, DO   20 mg at 07/18/21 2055   QUEtiapine (SEROQUEL) tablet 100 mg  100 mg Oral QHS Parks Ranger, DO       traZODone (DESYREL) tablet 50 mg  50 mg Oral QHS PRN Parks Ranger, DO   50 mg at 07/18/21 0444   PTA  Medications: Medications Prior to Admission  Medication Sig Dispense Refill Last Dose   albuterol (PROAIR HFA) 108 (90 Base) MCG/ACT inhaler Inhale 2 puffs into the lungs as needed (Shortness of breath). 6.7 g 3 07/17/2021   albuterol (PROVENTIL) (2.5 MG/3ML) 0.083% nebulizer solution Take 2.5 mg by nebulization every 4 (four) hours as needed for wheezing or shortness of breath.   07/17/2021   ARIPiprazole (ABILIFY) 5 MG tablet Take 5 mg by mouth at bedtime.    07/17/2021   budesonide-formoterol (SYMBICORT) 160-4.5 MCG/ACT inhaler Inhale 2 puffs into the lungs 2 (two) times daily. 1 Inhaler 6 07/17/2021   calcitRIOL (ROCALTROL) 0.25 MCG capsule Take 0.25 mcg by mouth daily.   07/17/2021   Calcium Carbonate-Vitamin D (CALTRATE 600+D PO) Take 500 mg by mouth 2 (two) times daily.    07/17/2021   cetirizine (ZYRTEC) 10 MG tablet Take 10 mg by mouth daily.   07/17/2021   gabapentin (NEURONTIN) 100 MG capsule Take 100 mg by mouth 3 (three) times daily.   07/17/2021   hydrALAZINE (APRESOLINE) 10 MG tablet Take 5 mg by mouth 3 (three) times daily.   07/17/2021   levothyroxine (SYNTHROID, LEVOTHROID) 100 MCG tablet Take 100 mcg by mouth daily before breakfast.   07/17/2021   losartan (COZAAR) 100 MG tablet Take 0.5 tablets (50 mg total) by  mouth daily. 5 tablet 0 07/17/2021   montelukast (SINGULAIR) 10 MG tablet Take 10 mg by mouth at bedtime.   07/17/2021   PARoxetine (PAXIL) 20 MG tablet Take 20 mg by mouth at bedtime.    07/17/2021   aspirin EC 325 MG tablet Take 1 tablet (325 mg total) by mouth 2 (two) times daily. 60 tablet 0    baclofen (LIORESAL) 10 MG tablet Take 1 tablet (10 mg total) by mouth 3 (three) times daily. As needed for muscle spasm (Patient taking differently: Take 10 mg by mouth 2 (two) times daily. As needed for muscle spasm) 50 tablet 0    HYDROcodone-acetaminophen (NORCO) 10-325 MG tablet Take 1 tablet by mouth every 6 (six) hours as needed. 28 tablet 0    sennosides-docusate sodium (SENOKOT-S) 8.6-50 MG  tablet Take 2 tablets by mouth daily. 30 tablet 1     Patient Stressors:    Patient Strengths: Supportive family/friends   Treatment Modalities: Medication Management, Group therapy, Case management,  1 to 1 session with clinician, Psychoeducation, Recreational therapy.   Physician Treatment Plan for Primary Diagnosis: MDD (major depressive disorder), recurrent severe, without psychosis ([place]) Long Term Goal(s): Improvement in symptoms so as ready for discharge   Short Term Goals: Ability to identify changes in lifestyle to reduce recurrence of condition will improve Ability to verbalize feelings will improve Ability to disclose and discuss suicidal ideas Ability to demonstrate self-control will improve Ability to identify and develop effective coping behaviors will improve Ability to maintain clinical measurements within normal limits will improve Compliance with prescribed medications will improve Ability to identify triggers associated with substance abuse/mental health issues will improve  Medication Management: Evaluate patient's response, side effects, and tolerance of medication regimen.  Therapeutic Interventions: 1 to 1 sessions, Unit Group sessions and Medication administration.  Evaluation of Outcomes: Not Met  Physician Treatment Plan for Secondary Diagnosis: Principal Problem:   MDD (major depressive disorder), recurrent severe, without psychosis ([place]) Active Problems:   Depression  Long Term Goal(s): Improvement in symptoms so as ready for discharge   Short Term Goals: Ability to identify changes in lifestyle to reduce recurrence of condition will improve Ability to verbalize feelings will improve Ability to disclose and discuss suicidal ideas Ability to demonstrate self-control will improve Ability to identify and develop effective coping behaviors will improve Ability to maintain clinical measurements within normal limits will improve Compliance with  prescribed medications will improve Ability to identify triggers associated with substance abuse/mental health issues will improve     Medication Management: Evaluate patient's response, side effects, and tolerance of medication regimen.  Therapeutic Interventions: 1 to 1 sessions, Unit Group sessions and Medication administration.  Evaluation of Outcomes: Not Met   RN Treatment Plan for Primary Diagnosis: MDD (major depressive disorder), recurrent severe, without psychosis ([place]) Long Term Goal(s): Knowledge of disease and therapeutic regimen to maintain health will improve  Short Term Goals: Ability to remain free from injury will improve, Ability to verbalize frustration and anger appropriately will improve, Ability to demonstrate self-control, Ability to participate in decision making will improve, Ability to verbalize feelings will improve, Ability to identify and develop effective coping behaviors will improve, and Compliance with prescribed medications will improve  Medication Management: RN will administer medications as ordered by provider, will assess and evaluate patient's response and provide education to patient for prescribed medication. RN will report any adverse and/or side effects to prescribing provider.  Therapeutic Interventions: 1 on 1 counseling sessions, Psychoeducation, Medication administration, Evaluate  responses to treatment, Monitor vital signs and CBGs as ordered, Perform/monitor CIWA, COWS, AIMS and Fall Risk screenings as ordered, Perform wound care treatments as ordered.  Evaluation of Outcomes: Not Met   LCSW Treatment Plan for Primary Diagnosis: MDD (major depressive disorder), recurrent severe, without psychosis ([place]) Long Term Goal(s): Safe transition to appropriate next level of care at discharge, Engage patient in therapeutic group addressing interpersonal concerns.  Short Term Goals: Engage patient in aftercare planning with referrals and resources,  Increase social support, Increase ability to appropriately verbalize feelings, Increase emotional regulation, Facilitate acceptance of mental health diagnosis and concerns, Facilitate patient progression through stages of change regarding substance use diagnoses and concerns, and Increase skills for wellness and recovery  Therapeutic Interventions: Assess for all discharge needs, 1 to 1 time with Social worker, Explore available resources and support systems, Assess for adequacy in community support network, Educate family and significant other(s) on suicide prevention, Complete Psychosocial Assessment, Interpersonal group therapy.  Evaluation of Outcomes: Not Met   Progress in Treatment: Attending groups: Yes. Participating in groups: Yes. Taking medication as prescribed: No. Toleration medication: Yes. Family/Significant other contact made: No, will contact:  when given permission Patient understands diagnosis: No. Discussing patient identified problems/goals with staff: Yes. Medical problems stabilized or resolved: Yes. Denies suicidal/homicidal ideation: Yes. Issues/concerns per patient self-inventory: No. Other: None  New problem(s) identified: No, Describe:  None  New Short Term/Long Term Goal(s): Patient to work towards elimination of symptoms of psychosis, medication management for mood stabilization; development of comprehensive mental wellness/sobriety plan.   Patient Goals:  "to get out of here.Marland Kitchen exercise, lose 5 pounds"  Discharge Plan or Barriers: CSW will assist pt in development of appropriate discharge/aftercare plan.   Reason for Continuation of Hospitalization: Aggression Delusions  Hallucinations Medication stabilization  Estimated Length of Stay: TBD   Scribe for Treatment Team: Colbin Jovel A [place], [place] 07/19/2021 2:05 PM

## 2021-07-19 NOTE — Progress Notes (Signed)
Recreation Therapy Notes  INPATIENT RECREATION TR PLAN  Patient Details Name: Becky Davis MRN: [id number] DOB: 04/08/1951 Today's Date: 07/19/2021  Rec Therapy Plan Is patient appropriate for Therapeutic Recreation?: Yes Treatment times per week: at least 3 Estimated Length of Stay: 5-7 days TR Treatment/Interventions: Group participation (Comment)  Discharge Criteria Pt will be discharged from therapy if:: Discharged Treatment plan/goals/alternatives discussed and agreed upon by:: Patient/family  Discharge Summary     Ruberta Holck 07/19/2021, 3:08 PM

## 2021-07-19 NOTE — Plan of Care (Signed)
°  Problem: Education: Goal: Mental status will improve Outcome: Not Progressing Goal: Verbalization of understanding the information provided will improve Outcome: Not Progressing   Problem: Activity: Goal: Interest or engagement in activities will improve Outcome: Not Progressing   Problem: Coping: Goal: Ability to demonstrate self-control will improve Outcome: Progressing

## 2021-07-19 NOTE — Progress Notes (Signed)
[facility] MD Progress Note  07/19/2021 11:12 AM Becky Davis  MRN:  [id number] Subjective: Becky Davis continues to be inappropriate.  She comes up to the nurses station and makes tangential statements of nonsense.  She did sleep pretty well last night according to the notes.  She picks and chooses her medications.  Her daughter called last night and was asking about a long-acting injection which is probably a good idea but in the meantime we are going to titrate her Abilify up.  She required some as needed Zyprexa and Ativan yesterday which did help.  She still paranoid and believes that someone is talking to her.  Yesterday, she stated that the machines talked to her at night.  She denies any suicidal ideation or depression.  Her principal problem is psychosis  Principal Problem: MDD (major depressive disorder), recurrent severe, without psychosis ([place]) Diagnosis: Principal Problem:   MDD (major depressive disorder), recurrent severe, without psychosis ([place]) Active Problems:   Depression  Total Time spent with patient: 15 minutes  Past Psychiatric History: She sees Dr. Toy Care  Past Medical History:  Past Medical History:  Diagnosis Date   Arthritis    knees   Asthma    Headache    hx of 20 years ago   Heart murmur    hx of 10-15 years ago   Hypertension    Hypothyroidism    Pneumonia    Pre-diabetes    Thyroid nodule     Past Surgical History:  Procedure Laterality Date   ABDOMINAL HYSTERECTOMY     thyroid goiter     surgery   THYROID LOBECTOMY     06-25-17 Dr. Harlow Asa   Left   THYROID LOBECTOMY Left 06/25/2017   Procedure: LEFT THYROID LOBECTOMY;  Surgeon: Armandina Gemma, MD;  Location: [facility];  Service: General;  Laterality: Left;   TOTAL KNEE ARTHROPLASTY Right 12/20/2019   Procedure: TOTAL KNEE ARTHROPLASTY;  Surgeon: Marchia Bond, MD;  Location: [facility];  Service: Orthopedics;  Laterality: Right;   Family History:  Family History  Problem Relation Age of Onset   Emphysema Mother     Cancer Mother    Heart disease Mother    Cancer Sister    Cancer Brother     Social History:  Social History   Substance and Sexual Activity  Alcohol Use No     Social History   Substance and Sexual Activity  Drug Use No    Social History   Socioeconomic History   Marital status: Married    Spouse name: Not on file   Number of children: 3   Years of education: Not on file   Highest education level: Not on file  Occupational History   Not on file  Tobacco Use   Smoking status: Never   Smokeless tobacco: Never  Vaping Use   Vaping Use: Never used  Substance and Sexual Activity   Alcohol use: No   Drug use: No   Sexual activity: Not Currently  Other Topics Concern   Not on file  Social History Narrative   Not on file   Social Determinants of Health   Financial Resource Strain: Not on file  Food Insecurity: Not on file  Transportation Needs: Not on file  Physical Activity: Not on file  Stress: Not on file  Social Connections: Not on file   Additional Social History:  Sleep: Fair  Appetite:  Good  Current Medications: Current Facility-Administered Medications  Medication Dose Route Frequency Provider Last Rate Last Admin   acetaminophen (TYLENOL) tablet 650 mg  650 mg Oral Q6H PRN Parks Ranger, DO       albuterol (PROVENTIL) (2.5 MG/3ML) 0.083% nebulizer solution 2.5 mg  2.5 mg Nebulization Q4H PRN Parks Ranger, DO       alum & mag hydroxide-simeth (MAALOX/MYLANTA) 200-200-20 MG/5ML suspension 30 mL  30 mL Oral Q4H PRN Parks Ranger, DO       ARIPiprazole (ABILIFY) tablet 10 mg  10 mg Oral [id number] Shelitha Magley Edward, DO       gabapentin (NEURONTIN) capsule 100 mg  100 mg Oral TID Parks Ranger, DO   100 mg at 07/19/21 7564   levothyroxine (SYNTHROID) tablet 100 mcg  100 mcg Oral QAC breakfast Parks Ranger, DO   100 mcg at 07/19/21 3329   loratadine (CLARITIN)  tablet 10 mg  10 mg Oral Daily Parks Ranger, DO   10 mg at 07/19/21 5188   losartan (COZAAR) tablet 50 mg  50 mg Oral Daily Parks Ranger, DO   50 mg at 07/19/21 4166   magnesium hydroxide (MILK OF MAGNESIA) suspension 30 mL  30 mL Oral Daily PRN Parks Ranger, DO       montelukast (SINGULAIR) tablet 10 mg  10 mg Oral QHS Parks Ranger, DO   10 mg at 07/18/21 2054   PARoxetine (PAXIL) tablet 20 mg  20 mg Oral QHS Parks Ranger, DO   20 mg at 07/18/21 2055   QUEtiapine (SEROQUEL) tablet 100 mg  100 mg Oral QHS Parks Ranger, DO       traZODone (DESYREL) tablet 50 mg  50 mg Oral QHS PRN Parks Ranger, DO   50 mg at 07/18/21 0444    Lab Results:  Results for orders placed or performed during the hospital encounter of 07/17/21 (from the past 48 hour(s))  Lipid panel     Status: Abnormal   Collection Time: 07/19/21  7:46 AM  Result Value Ref Range   Cholesterol 178 0 - 200 mg/dL   Triglycerides 85 <150 mg/dL   HDL 49 >40 mg/dL   Total CHOL/HDL Ratio 3.6 RATIO   VLDL 17 0 - 40 mg/dL   LDL Cholesterol 112 (H) 0 - 99 mg/dL    Comment:        Total Cholesterol/HDL:CHD Risk Coronary Heart Disease Risk Table                     Men   Women  1/2 Average Risk   3.4   3.3  Average Risk       5.0   4.4  2 X Average Risk   9.6   7.1  3 X Average Risk  23.4   11.0        Use the calculated Patient Ratio above and the CHD Risk Table to determine the patient's CHD Risk.        ATP III CLASSIFICATION (LDL):  <100     mg/dL   Optimal  100-129  mg/dL   Near or Above                    Optimal  130-159  mg/dL   Borderline  160-189  mg/dL   High  >190     mg/dL   Very High Performed at  [facility], [address] [place], [place] [address]   TSH     Status: None   Collection Time: 07/19/21  7:46 AM  Result Value Ref Range   TSH 2.041 0.350 - 4.500 uIU/mL    Comment: Performed by a 3rd Generation assay with a  functional sensitivity of <=0.01 uIU/mL. Performed at [facility], [address] [place], [place] [address]     Blood Alcohol level:  Lab Results  Component Value Date   Bdpec [facility] Show Low <10 88/41/6606    Metabolic Disorder Labs: Lab Results  Component Value Date   HGBA1C 5.8 (H) 12/13/2019   MPG 119.76 12/13/2019   No results found for: PROLACTIN Lab Results  Component Value Date   CHOL 178 07/19/2021   TRIG 85 07/19/2021   HDL 49 07/19/2021   CHOLHDL 3.6 07/19/2021   VLDL 17 07/19/2021   LDLCALC 112 (H) 07/19/2021    Physical Findings: AIMS:  , ,  ,  ,    CIWA:    COWS:     Musculoskeletal: Strength & Muscle Tone: within normal limits Gait & Station: normal Patient leans: N/A  Psychiatric Specialty Exam:  Presentation  General Appearance: No data recorded Eye Contact:No data recorded Speech:No data recorded Speech Volume:No data recorded Handedness:No data recorded  Mood and Affect  Mood:No data recorded Affect:No data recorded  Thought Process  Thought Processes:No data recorded Descriptions of Associations:No data recorded Orientation:No data recorded Thought Content:No data recorded History of Schizophrenia/Schizoaffective disorder:Yes  Duration of Psychotic Symptoms:Greater than six months  Hallucinations:No data recorded Ideas of Reference:No data recorded Suicidal Thoughts:No data recorded Homicidal Thoughts:No data recorded  Sensorium  Memory:No data recorded Judgment:No data recorded Insight:No data recorded  Executive Functions  Concentration:No data recorded Attention Span:No data recorded Recall:No data recorded Fund of Knowledge:No data recorded Language:No data recorded  Psychomotor Activity  Psychomotor Activity:No data recorded  Assets  Assets:No data recorded  Sleep  Sleep:No data recorded   Physical Exam: Physical Exam Vitals and nursing note reviewed.  Constitutional:      Appearance: Normal appearance. She  is normal weight.  Neurological:     General: No focal deficit present.     Mental Status: She is alert and oriented to person, place, and time.  Psychiatric:        Attention and Perception: Attention normal. She perceives auditory hallucinations.        Mood and Affect: Mood is anxious. Affect is inappropriate.        Speech: Speech is tangential.        Behavior: Behavior is agitated.        Thought Content: Thought content is delusional.        Cognition and Memory: Cognition and memory normal.        Judgment: Judgment is inappropriate.   Review of Systems  Constitutional: Negative.   HENT: Negative.    Eyes: Negative.   Respiratory: Negative.    Cardiovascular: Negative.   Gastrointestinal: Negative.   Genitourinary: Negative.   Musculoskeletal: Negative.   Skin: Negative.   Neurological: Negative.   Endo/Heme/Allergies: Negative.   Psychiatric/Behavioral:  Positive for hallucinations.   Blood pressure 124/70, pulse 77, temperature 97.8 F (36.6 C), temperature source Oral, resp. rate 18, height 5\' 3"  (1.6 m), weight 87.5 kg, SpO2 97 %. Body mass index is 34.17 kg/m.   Treatment Plan Summary: Daily contact with patient to assess and evaluate symptoms and progress in treatment, Medication management, and Plan increase Abilify to  10 mg twice a day.  [place], DO 07/19/2021, 11:12 AM

## 2021-07-19 NOTE — Progress Notes (Signed)
Patient sleeping most of shift, up ringing call bell every hour with no request for partial night. Patient still confused and hallucinating. Paranoid, reports she heard staff in her room laughing and talking about a million dollars. Standing in hallway at room, yelling down the hall talking with nurses. Refused some of her medications. Eating meals well, but only drinks bottled water. Encouragement and support provided. Safety checks maintained. Medications given as prescribed. Pt remains safe on unit with q 15 min checks.

## 2021-07-19 NOTE — Plan of Care (Deleted)
Patient presents pacing in hallway stating, "I have myasthenia gravis and walking helps me with that."  Gait steady and denies need for walker.  Patient is A&O to person, place and time but confused to situation with flight of ideas and paranoia stating, "My husband said I was hearing voices but we were being burglarized and they cremated the body after we paid $20,000"  Affect is calm, guarded and cooperative.  Denies AVH, SI, or HI.  Rates depression 4/10 and anxiety 3/10.   Rates right knee pain 2/10.  Declines need for pain med at this time.  Reports sleeping well and appetite good.  Ate 100% of meal.  VSS.  Patient compliant with all scheduled meds.   Ongoing Q15 minute safety check rounds per unit protocol.  Problem: Education: Goal: Knowledge of [facility] Education information/materials will improve Outcome: Progressing Goal: Emotional status will improve Outcome: Progressing Goal: Mental status will improve Outcome: Progressing Goal: Verbalization of understanding the information provided will improve Outcome: Progressing   Problem: Activity: Goal: Interest or engagement in activities will improve Outcome: Progressing Goal: Sleeping patterns will improve Outcome: Progressing   Problem: Health Behavior/Discharge Planning: Goal: Compliance with treatment plan for underlying cause of condition will improve Outcome: Progressing

## 2021-07-19 NOTE — BHH Counselor (Signed)
CSW spoke with pt's daughter, power of attorney, Geralyn Flash, [phone] who was calling regarding update on pt.   She stated that family may be interested in a partial hospitalization placement or [place] placement at [facility]) if needed upon discharge.   She stated that the pt's family is very involved and to let them know if additional collateral information was needed they would be happy to assist.   Regarding medication, she stated that family would be interested in starting an Abilify injection if possible. CSW will relay medication inquiry to provider.   Conversation ended without incident.   Early Ord [place], MSW, LCSW-A 1/6/20234:04 PM

## 2021-07-19 NOTE — Progress Notes (Signed)
Recreation Therapy Notes  INPATIENT RECREATION THERAPY ASSESSMENT  Patient Details Name: Becky Davis MRN: [id number] DOB: 05-02-1951 Today's Date: 07/19/2021       Information Obtained From: Patient  Able to Participate in Assessment/Interview: Yes  Patient Presentation: Responsive  Reason for Admission (Per Patient): Patient Unable to Identify  Patient Stressors:    Coping Skills:   Talk, Exercise  Leisure Interests (2+):  Social - Family, Exercise - Walking (Cooking, Helping others)  Frequency of Recreation/Participation: Monthly  Awareness of Community Resources:  No  Community Resources:     Current Use:    If no, Barriers?:    Expressed Interest in [place]: Yes  County of Residence:  [place]  Patient Main Form of Transportation: Car  Patient Strengths:  Caring  Patient Identified Areas of Improvement:  Nothing  Patient Goal for Hospitalization:  To go home  Current SI (including self-harm):  No  Current HI:  No  Current AVH: No  Staff Intervention Plan: Group Attendance, Collaborate with Interdisciplinary Treatment Team  Consent to Intern Participation: N/A  Becky Davis 07/19/2021, 3:07 PM

## 2021-07-19 NOTE — BHH Counselor (Signed)
CSW was given verbal permission by pt to contact pt's husband, Teshia Mahone, [phone], [phone].   CSW will contact husband as needed for collateral information and discharge planning purposes regarding pt.   Kyleigh Nannini [place], MSW, LCSW-A 1/6/20233:13 PM

## 2021-07-20 MED ORDER — HALOPERIDOL LACTATE 5 MG/ML IJ SOLN
INTRAMUSCULAR | Status: AC
  Administered 2021-07-20: 5 mg
  Filled 2021-07-20: qty 1

## 2021-07-20 MED ORDER — LORAZEPAM 2 MG/ML IJ SOLN
INTRAMUSCULAR | Status: AC
  Filled 2021-07-20: qty 1

## 2021-07-20 MED ORDER — DIPHENHYDRAMINE HCL 25 MG PO CAPS: 25.0000 mg | ORAL_CAPSULE | Freq: Four times a day (QID) | ORAL | Status: DC | PRN

## 2021-07-20 MED ORDER — OLANZAPINE 10 MG IM SOLR
5.0000 mg | Freq: Once | INTRAMUSCULAR | Status: AC
  Administered 2021-07-20: 5 mg via INTRAMUSCULAR
  Filled 2021-07-20: qty 10

## 2021-07-20 MED ORDER — DIPHENHYDRAMINE HCL 50 MG/ML IJ SOLN
25.0000 mg | Freq: Four times a day (QID) | INTRAMUSCULAR | Status: DC | PRN
  Administered 2021-07-20 – 2021-07-24 (×2): 25 mg via INTRAMUSCULAR
  Filled 2021-07-20 (×3): qty 1

## 2021-07-20 MED ORDER — HALOPERIDOL LACTATE 5 MG/ML IJ SOLN
5.0000 mg | Freq: Four times a day (QID) | INTRAMUSCULAR | Status: DC | PRN
  Administered 2021-07-24 (×2): 5 mg via INTRAMUSCULAR
  Filled 2021-07-20 (×2): qty 1

## 2021-07-20 MED ORDER — HALOPERIDOL 5 MG PO TABS: 5.0000 mg | ORAL_TABLET | Freq: Four times a day (QID) | ORAL | Status: DC | PRN

## 2021-07-20 MED ORDER — OLANZAPINE 5 MG PO TBDP
5.0000 mg | ORAL_TABLET | Freq: Every day | ORAL | Status: DC
  Filled 2021-07-20: qty 1

## 2021-07-20 NOTE — Progress Notes (Addendum)
Patient was asked again to take PO medications and refused. Staff explained that she would have to get an IM injection if she refused. Pt said "bring it on". On call NP was called and Zyprexa 5mg  IM was ordered. Security was called. Two security guards, 2 staff nurses and MHT were involved in the administered the ordered Zyprexa 5 mg into her left arm. Pt remains in front of the nurses station responding to stimuli. Pt remains safe on the unit at this time.

## 2021-07-20 NOTE — Progress Notes (Addendum)
Patient continues to pace halls and stay near nurses station responding to stimuli and yelling very loudly. Pt is hallucinating and believes she will be on the news. On call MD was contacted and Haldol 5mg  and Bendryl 25 mg was ordered IM. It took 2 security guards and entire staff to administer the injections to patient's buttocks. One in each buttocks. Pt stayed in room for about an hour afterwards but is restless and continues to pace to the nurses station and in the hallways. Pt continues to be paranoid, delusional, tangential and disorganized. Pt remains safe on the unit at this time.

## 2021-07-20 NOTE — Progress Notes (Addendum)
At the beginning of the shift patient is seen in the dayroom with husband. Patient got up and began pacing the halls with husband trailing behind. I heard her husband say " can I get a hug" and patient responded with "no". I walked her visitor off of the unit calmly. Patient is having paranoid delusions. She came up to the nurses station stating, "911 is coming". Patient appears anxious and seems to be responding to internal stimuli. Pt refused to take medications and told me to "try again later" after telling me that I did not work here. Pt would not state her name or date of birth when I asked her to. Pt remains safe on the unit at this time.

## 2021-07-20 NOTE — Progress Notes (Signed)
[facility] MD Progress Note  07/20/2021 12:34 PM KEYLY BALDONADO  MRN:  [id number] Subjective:   Patient is alert and oriented x4. Pt complained of not being able to hear too well because she does not have her hearing aids. Pt also complained of having frequent bowel movements counting four today. Pt denies feelings of anxiety, depression, SI, HI, and AVH at this time. Pt is observed pacing the hallways and when asked she said she is wanting to walk 5 miles a day. Pt c/o left knee pain rating 4/10. Pt remains safe on the unit at this time.   Patient reports feeling alright today; she has been wanting a new COVID test and was redirected about it. She is also worried about Myasthenia test and wants to know if it has been done. She slept alright last night and is compliant with her medicines.   Principal Problem: MDD (major depressive disorder), recurrent severe, without psychosis ([place]) Diagnosis: Principal Problem:   MDD (major depressive disorder), recurrent severe, without psychosis ([place]) Active Problems:   Depression  Total Time spent with patient: 20 minutes  Past Psychiatric History: MDD  Past Medical History:  Past Medical History:  Diagnosis Date   Arthritis    knees   Asthma    Headache    hx of 20 years ago   Heart murmur    hx of 10-15 years ago   Hypertension    Hypothyroidism    Pneumonia    Pre-diabetes    Thyroid nodule     Past Surgical History:  Procedure Laterality Date   ABDOMINAL HYSTERECTOMY     thyroid goiter     surgery   THYROID LOBECTOMY     06-25-17 Dr. Harlow Asa   Left   THYROID LOBECTOMY Left 06/25/2017   Procedure: LEFT THYROID LOBECTOMY;  Surgeon: Armandina Gemma, MD;  Location: [facility];  Service: General;  Laterality: Left;   TOTAL KNEE ARTHROPLASTY Right 12/20/2019   Procedure: TOTAL KNEE ARTHROPLASTY;  Surgeon: Marchia Bond, MD;  Location: [facility];  Service: Orthopedics;  Laterality: Right;   Family History:  Family History  Problem Relation Age of Onset    Emphysema Mother    Cancer Mother    Heart disease Mother    Cancer Sister    Cancer Brother    Family Psychiatric  History:  Social History:  Social History   Substance and Sexual Activity  Alcohol Use No     Social History   Substance and Sexual Activity  Drug Use No    Social History   Socioeconomic History   Marital status: Married    Spouse name: Not on file   Number of children: 3   Years of education: Not on file   Highest education level: Not on file  Occupational History   Not on file  Tobacco Use   Smoking status: Never   Smokeless tobacco: Never  Vaping Use   Vaping Use: Never used  Substance and Sexual Activity   Alcohol use: No   Drug use: No   Sexual activity: Not Currently  Other Topics Concern   Not on file  Social History Narrative   Not on file   Social Determinants of Health   Financial Resource Strain: Not on file  Food Insecurity: Not on file  Transportation Needs: Not on file  Physical Activity: Not on file  Stress: Not on file  Social Connections: Not on file   Additional Social History:  Sleep: Fair  Appetite:  Good  Current Medications: Current Facility-Administered Medications  Medication Dose Route Frequency Provider Last Rate Last Admin   acetaminophen (TYLENOL) tablet 650 mg  650 mg Oral Q6H PRN Parks Ranger, DO   650 mg at 07/19/21 2100   albuterol (PROVENTIL) (2.5 MG/3ML) 0.083% nebulizer solution 2.5 mg  2.5 mg Nebulization Q4H PRN Parks Ranger, DO       alum & mag hydroxide-simeth (MAALOX/MYLANTA) 200-200-20 MG/5ML suspension 30 mL  30 mL Oral Q4H PRN Parks Ranger, DO       ARIPiprazole (ABILIFY) tablet 10 mg  10 mg Oral BH-q8a4p Parks Ranger, DO   10 mg at 07/20/21 2671   gabapentin (NEURONTIN) capsule 100 mg  100 mg Oral TID Parks Ranger, DO   100 mg at 07/20/21 0915   levothyroxine (SYNTHROID) tablet 100 mcg  100 mcg Oral QAC  breakfast Parks Ranger, DO   100 mcg at 07/20/21 0528   loratadine (CLARITIN) tablet 10 mg  10 mg Oral Daily Parks Ranger, DO   10 mg at 07/20/21 2458   losartan (COZAAR) tablet 50 mg  50 mg Oral Daily Parks Ranger, DO   50 mg at 07/20/21 0915   magnesium hydroxide (MILK OF MAGNESIA) suspension 30 mL  30 mL Oral Daily PRN Parks Ranger, DO       montelukast (SINGULAIR) tablet 10 mg  10 mg Oral QHS Parks Ranger, DO   10 mg at 07/19/21 2100   PARoxetine (PAXIL) tablet 20 mg  20 mg Oral QHS Parks Ranger, DO   20 mg at 07/19/21 2100   QUEtiapine (SEROQUEL) tablet 100 mg  100 mg Oral QHS Parks Ranger, DO   100 mg at 07/19/21 2100   traZODone (DESYREL) tablet 50 mg  50 mg Oral QHS PRN Parks Ranger, DO   50 mg at 07/19/21 2100    Lab Results:  Results for orders placed or performed during the hospital encounter of 07/17/21 (from the past 48 hour(s))  Hemoglobin A1c     Status: None   Collection Time: 07/19/21  7:46 AM  Result Value Ref Range   Hgb A1c MFr Bld 5.4 4.8 - 5.6 %    Comment: (NOTE) Pre diabetes:          5.7%-6.4%  Diabetes:              >6.4%  Glycemic control for   <7.0% adults with diabetes    Mean Plasma Glucose 108.28 mg/dL    Comment: Performed at [facility], [place] [address], [place], [place] [address]  Lipid panel     Status: Abnormal   Collection Time: 07/19/21  7:46 AM  Result Value Ref Range   Cholesterol 178 0 - 200 mg/dL   Triglycerides 85 <150 mg/dL   HDL 49 >40 mg/dL   Total CHOL/HDL Ratio 3.6 RATIO   VLDL 17 0 - 40 mg/dL   LDL Cholesterol 112 (H) 0 - 99 mg/dL    Comment:        Total Cholesterol/HDL:CHD Risk Coronary Heart Disease Risk Table                     Men   Women  1/2 Average Risk   3.4   3.3  Average Risk       5.0   4.4  2 X Average Risk   9.6   7.1  3 X Average Risk  23.4   11.0        Use the calculated Patient Ratio above and the CHD Risk  Table to determine the patient's CHD Risk.        ATP III CLASSIFICATION (LDL):  <100     mg/dL   Optimal  100-129  mg/dL   Near or Above                    Optimal  130-159  mg/dL   Borderline  160-189  mg/dL   High  >190     mg/dL   Very High Performed at [facility], [address] [place], [place] [address]   TSH     Status: None   Collection Time: 07/19/21  7:46 AM  Result Value Ref Range   TSH 2.041 0.350 - 4.500 uIU/mL    Comment: Performed by a 3rd Generation assay with a functional sensitivity of <=0.01 uIU/mL. Performed at [facility], [address] [place], [place] [address]     Blood Alcohol level:  Lab Results  Component Value Date   [facility] <10 41/32/4401    Metabolic Disorder Labs: Lab Results  Component Value Date   HGBA1C 5.4 07/19/2021   MPG 108.28 07/19/2021   MPG 119.76 12/13/2019   No results found for: PROLACTIN Lab Results  Component Value Date   CHOL 178 07/19/2021   TRIG 85 07/19/2021   HDL 49 07/19/2021   CHOLHDL 3.6 07/19/2021   VLDL 17 07/19/2021   LDLCALC 112 (H) 07/19/2021    Physical Findings: AIMS:  , ,  ,  ,    CIWA:    COWS:     Musculoskeletal: Strength & Muscle Tone: within normal limits Gait & Station: normal Patient leans: N/A  Psychiatric Specialty Exam:  Presentation  General Appearance: No data recorded Eye Contact:No data recorded Speech:No data recorded Speech Volume:No data recorded Handedness:No data recorded  Mood and Affect  Mood:No data recorded Affect:No data recorded  Thought Process  Thought Processes:No data recorded Descriptions of Associations:No data recorded Orientation:No data recorded Thought Content:No data recorded History of Schizophrenia/Schizoaffective disorder:Yes  Duration of Psychotic Symptoms:Greater than six months  Hallucinations:No data recorded Ideas of Reference:No data recorded Suicidal Thoughts:No data recorded Homicidal Thoughts:No data  recorded  Sensorium  Memory:No data recorded Judgment:No data recorded Insight:No data recorded  Executive Functions  Concentration:No data recorded Attention Span:No data recorded Recall:No data recorded Fund of Knowledge:No data recorded Language:No data recorded  Psychomotor Activity  Psychomotor Activity:No data recorded  Assets  Assets:No data recorded  Sleep  Sleep:No data recorded   Physical Exam: Physical Exam Constitutional:      Appearance: Normal appearance. She is normal weight.  HENT:     Head: Normocephalic and atraumatic.     Nose: Nose normal.     Mouth/Throat:     Mouth: Mucous membranes are moist.     Pharynx: Oropharynx is clear.  Eyes:     Extraocular Movements: Extraocular movements intact.     Conjunctiva/sclera: Conjunctivae normal.     Pupils: Pupils are equal, round, and reactive to light.  Cardiovascular:     Rate and Rhythm: Normal rate and regular rhythm.     Pulses: Normal pulses.     Heart sounds: Normal heart sounds.  Pulmonary:     Effort: Pulmonary effort is normal.     Breath sounds: Normal breath sounds.  Abdominal:     General: Abdomen is flat.  Bowel sounds are normal.     Palpations: Abdomen is soft.  Musculoskeletal:        General: Normal range of motion.     Cervical back: Normal range of motion and neck supple.  Skin:    General: Skin is warm.  Neurological:     General: No focal deficit present.     Mental Status: She is alert.   Review of Systems  Constitutional: Negative.   HENT: Negative.    Eyes: Negative.   Respiratory: Negative.    Cardiovascular: Negative.   Gastrointestinal: Negative.   Genitourinary: Negative.   Musculoskeletal: Negative.   Skin: Negative.   Neurological: Negative.   Endo/Heme/Allergies: Negative.   Psychiatric/Behavioral:  Positive for memory loss. The patient is nervous/anxious.   Blood pressure 140/74, pulse 90, temperature 98.3 F (36.8 C), temperature source Oral, resp. rate  20, height 5\' 3"  (1.6 m), weight 87.5 kg, SpO2 95 %. Body mass index is 34.17 kg/m.   Treatment Plan Summary: Daily contact with patient to assess and evaluate symptoms and progress in treatment, Medication management, and Plan : Continue current meds and doses.   Antonieta Pert 07/20/2021, 12:34 PM

## 2021-07-20 NOTE — Group Note (Signed)
LCSW Group Therapy Note  Group Date: 07/20/2021 Start Time: 1440 End Time: 1456   Type of Therapy and Topic:  Group Therapy: Anger Cues and Responses  Participation Level:  Did Not Attend   Description of Group:   In this group, patients learned how to recognize the physical, cognitive, emotional, and behavioral responses they have to anger-provoking situations.  They identified a recent time they became angry and how they reacted.  They analyzed how their reaction was possibly beneficial and how it was possibly unhelpful.  The group discussed a variety of healthier coping skills that could help with such a situation in the future.  Focus was placed on how helpful it is to recognize the underlying emotions to our anger, because working on those can lead to a more permanent solution as well as our ability to focus on the important rather than the urgent.  Therapeutic Goals: Patients will remember their last incident of anger and how they felt emotionally and physically, what their thoughts were at the time, and how they behaved. Patients will identify how their behavior at that time worked for them, as well as how it worked against them. Patients will explore possible new behaviors to use in future anger situations. Patients will learn that anger itself is normal and cannot be eliminated, and that healthier reactions can assist with resolving conflict rather than worsening situations.  Summary of Patient Progress: Patient did not attend group despite encouraged participation.   Therapeutic Modalities:   Cognitive Behavioral Therapy    Berniece Salines, [place] 07/20/2021  6:04 PM

## 2021-07-20 NOTE — Progress Notes (Addendum)
Pt visible in milieu continues to pace halls, talks to self with multiple hand gestures at intervals. She A & O X3 with fair eye contact, ambulatory with slow and steady gait. Incontinent of bladder X1 thus far. Denies SI, HI and AVH when assessed. Remains disorganized / preoccupied about working for the [employer], winning the lottery, physical activities to achieve weight loss goal "I have to walk 5 miles a day to loose weight. I need to win the lottery and gave to charity". Pt requires multiple verbal redirections thus far due to tangential speech, flight of ideas.  C/O left knee pain "like 4/10" but declined PRN Tylenol when offered "No, I don't need anything right now". Observed to be delusional, demanding and intrusive on interactions "I had a meeting with Madison Hickman and he sent me here to shut this place down. I need a Covid test now, and I want to call Madison Hickman now". Safety checks maintained at Q 15 minutes intervals without self harm gestures. Scheduled medications administered with verbal education and effects monitored. Support and reassurance provided to pt. Nursing staff encouraged pt to voice concerns. Pt tolerates fluids and medications well. Mouth checks done to ensure compliance due to reports of possible cheeking, hiding medications in her hands. Pt remains safe on unit; appears to be in no physical distress. Continues to demand discharge and Covid test.

## 2021-07-21 MED ORDER — DONEPEZIL HCL 5 MG PO TABS
5.0000 mg | ORAL_TABLET | Freq: Every day | ORAL | Status: DC
  Administered 2021-07-21 – 2021-08-05 (×15): 5 mg via ORAL
  Filled 2021-07-21 (×17): qty 1

## 2021-07-21 MED ORDER — GABAPENTIN 100 MG PO CAPS
200.0000 mg | ORAL_CAPSULE | Freq: Three times a day (TID) | ORAL | Status: DC
  Administered 2021-07-21 – 2021-07-30 (×26): 200 mg via ORAL
  Filled 2021-07-21 (×27): qty 2

## 2021-07-21 MED ORDER — QUETIAPINE FUMARATE 100 MG PO TABS
200.0000 mg | ORAL_TABLET | Freq: Every day | ORAL | Status: DC
  Administered 2021-07-21 – 2021-07-22 (×2): 200 mg via ORAL
  Filled 2021-07-21 (×3): qty 2

## 2021-07-21 NOTE — Progress Notes (Signed)
[facility] MD Progress Note  07/21/2021 11:39 AM Becky Davis  MRN:  [id number] Subjective:  Patient remains confused, paranoid, delusional and talkative. She got agitated last night , needing IM PRN medicines. She did eventually get to sleep and it appears that she is experiencing " sundowning". She is displaying poor cognition, does not remember the night's events and is talking about irrelevant things today. She is hard to redirect, does report AH and possibly has VH.  Principal Problem: MDD (major depressive disorder), recurrent severe, without psychosis ([place]) Diagnosis: Principal Problem:   MDD (major depressive disorder), recurrent severe, without psychosis ([place]) Active Problems:   Depression  Total Time spent with patient: 20 minutes  Past Psychiatric History: MDD, Dementia  Past Medical History:  Past Medical History:  Diagnosis Date   Arthritis    knees   Asthma    Headache    hx of 20 years ago   Heart murmur    hx of 10-15 years ago   Hypertension    Hypothyroidism    Pneumonia    Pre-diabetes    Thyroid nodule     Past Surgical History:  Procedure Laterality Date   ABDOMINAL HYSTERECTOMY     thyroid goiter     surgery   THYROID LOBECTOMY     06-25-17 Dr. Harlow Asa   Left   THYROID LOBECTOMY Left 06/25/2017   Procedure: LEFT THYROID LOBECTOMY;  Surgeon: Armandina Gemma, MD;  Location: [facility];  Service: General;  Laterality: Left;   TOTAL KNEE ARTHROPLASTY Right 12/20/2019   Procedure: TOTAL KNEE ARTHROPLASTY;  Surgeon: Marchia Bond, MD;  Location: [facility];  Service: Orthopedics;  Laterality: Right;   Family History:  Family History  Problem Relation Age of Onset   Emphysema Mother    Cancer Mother    Heart disease Mother    Cancer Sister    Cancer Brother    Family Psychiatric  History:  Social History:  Social History   Substance and Sexual Activity  Alcohol Use No     Social History   Substance and Sexual Activity  Drug Use No    Social History    Socioeconomic History   Marital status: Married    Spouse name: Not on file   Number of children: 3   Years of education: Not on file   Highest education level: Not on file  Occupational History   Not on file  Tobacco Use   Smoking status: Never   Smokeless tobacco: Never  Vaping Use   Vaping Use: Never used  Substance and Sexual Activity   Alcohol use: No   Drug use: No   Sexual activity: Not Currently  Other Topics Concern   Not on file  Social History Narrative   Not on file   Social Determinants of Health   Financial Resource Strain: Not on file  Food Insecurity: Not on file  Transportation Needs: Not on file  Physical Activity: Not on file  Stress: Not on file  Social Connections: Not on file   Additional Social History:                         Sleep: Fair  Appetite:  Fair  Current Medications: Current Facility-Administered Medications  Medication Dose Route Frequency Provider Last Rate Last Admin   acetaminophen (TYLENOL) tablet 650 mg  650 mg Oral Q6H PRN Parks Ranger, DO   650 mg at 07/19/21 2100   albuterol (PROVENTIL) (2.5 MG/3ML) 0.083%  nebulizer solution 2.5 mg  2.5 mg Nebulization Q4H PRN Parks Ranger, DO       alum & mag hydroxide-simeth (MAALOX/MYLANTA) 200-200-20 MG/5ML suspension 30 mL  30 mL Oral Q4H PRN Parks Ranger, DO       ARIPiprazole (ABILIFY) tablet 10 mg  10 mg Oral BH-q8a4p Parks Ranger, DO   10 mg at 07/21/21 0751   diphenhydrAMINE (BENADRYL) capsule 25 mg  25 mg Oral Q6H PRN Wynell Balloon, Illyana Schorsch       Or   diphenhydrAMINE (BENADRYL) injection 25 mg  25 mg Intramuscular Q6H PRN Wynell Balloon, Audris Speaker   25 mg at 07/20/21 2131   gabapentin (NEURONTIN) capsule 100 mg  100 mg Oral TID Parks Ranger, DO   100 mg at 07/21/21 0925   haloperidol (HALDOL) tablet 5 mg  5 mg Oral Q6H PRN Antonieta Pert       Or   haloperidol lactate (HALDOL) injection 5 mg  5 mg Intramuscular Q6H PRN Wynell Balloon, Camryn Quesinberry        levothyroxine (SYNTHROID) tablet 100 mcg  100 mcg Oral QAC breakfast Parks Ranger, DO   100 mcg at 07/21/21 9833   loratadine (CLARITIN) tablet 10 mg  10 mg Oral Daily Parks Ranger, DO   10 mg at 07/21/21 8250   losartan (COZAAR) tablet 50 mg  50 mg Oral Daily Parks Ranger, DO   50 mg at 07/21/21 5397   magnesium hydroxide (MILK OF MAGNESIA) suspension 30 mL  30 mL Oral Daily PRN Parks Ranger, DO       montelukast (SINGULAIR) tablet 10 mg  10 mg Oral QHS Parks Ranger, DO   10 mg at 07/19/21 2100   OLANZapine zydis (ZYPREXA) disintegrating tablet 5 mg  5 mg Oral QHS Patrecia Pour, NP       PARoxetine (PAXIL) tablet 20 mg  20 mg Oral QHS Parks Ranger, DO   20 mg at 07/19/21 2100   QUEtiapine (SEROQUEL) tablet 100 mg  100 mg Oral QHS Parks Ranger, DO   100 mg at 07/19/21 2100   traZODone (DESYREL) tablet 50 mg  50 mg Oral QHS PRN Parks Ranger, DO   50 mg at 07/19/21 2100    Lab Results: No results found for this or any previous visit (from the past 48 hour(s)).  Blood Alcohol level:  Lab Results  Component Value Date   ETH <10 67/[birth year]/1937    Metabolic Disorder Labs: Lab Results  Component Value Date   HGBA1C 5.4 07/19/2021   MPG 108.28 07/19/2021   MPG 119.76 12/13/2019   No results found for: PROLACTIN Lab Results  Component Value Date   CHOL 178 07/19/2021   TRIG 85 07/19/2021   HDL 49 07/19/2021   CHOLHDL 3.6 07/19/2021   VLDL 17 07/19/2021   LDLCALC 112 (H) 07/19/2021    Physical Findings: AIMS:  , ,  ,  ,    CIWA:    COWS:     Musculoskeletal: Strength & Muscle Tone: within normal limits Gait & Station: normal Patient leans: N/A  Psychiatric Specialty Exam:  Presentation  General Appearance: Casual; Disheveled  Eye Contact:Fair  Speech:Pressured  Speech Volume:Increased  Handedness:No data recorded  Mood and Affect   Mood:Anxious  Affect:Appropriate   Thought Process  Thought Processes:Disorganized  Descriptions of Associations:Loose  Orientation:Partial  Thought Content:Perseveration; Scattered  History of Schizophrenia/Schizoaffective disorder:No  Duration of Psychotic Symptoms:Greater than six months  Hallucinations:Hallucinations: AH  Ideas of  Reference:Delusions  Suicidal Thoughts:Suicidal Thoughts: No  Homicidal Thoughts:Homicidal Thoughts: No   Sensorium  Memory:Immediate Poor; Remote Poor  Judgment:Fair  Insight:Fair   Executive Functions  Concentration:Fair  Attention Span:Fair  [place]   Psychomotor Activity  Psychomotor Activity:Psychomotor Activity: Normal   Assets  Assets:Communication Skills; Desire for Improvement; Financial Resources/Insurance; Housing; Social Support   Sleep  Sleep:Sleep: Fair    Physical Exam: Physical Exam ROS Blood pressure 136/88, pulse 84, temperature 97.6 F (36.4 C), temperature source Oral, resp. rate 20, height 5\' 3"  (1.6 m), weight 87.5 kg, SpO2 96 %. Body mass index is 34.17 kg/m.   Treatment Plan Summary: Daily contact with patient to assess and evaluate symptoms and progress in treatment, Medication management, and Plan : Increase Gabapentin dosage. Increase Seroquel dose at night.   Antonieta Pert 07/21/2021, 11:39 AM

## 2021-07-21 NOTE — Group Note (Signed)
LCSW Group Therapy Note  Group Date: 07/21/2021 Start Time: 1500 End Time: 1520   Type of Therapy and Topic:  Group Therapy - Healthy vs Unhealthy Coping Skills  Participation Level:  Did Not Attend   Description of Group The focus of this group was to determine what unhealthy coping techniques typically are used by group members and what healthy coping techniques would be helpful in coping with various problems. Patients were guided in becoming aware of the differences between healthy and unhealthy coping techniques. Patients were asked to identify 2-3 healthy coping skills they would like to learn to use more effectively.  Therapeutic Goals Patients learned that coping is what human beings do all day long to deal with various situations in their lives Patients defined and discussed healthy vs unhealthy coping techniques Patients identified their preferred coping techniques and identified whether these were healthy or unhealthy Patients determined 2-3 healthy coping skills they would like to become more familiar with and use more often. Patients provided support and ideas to each other   Summary of Patient Progress: Patient did not attend group despite encouraged participation.   Therapeutic Modalities Cognitive [place], Latanya Presser 07/21/2021  5:54 PM

## 2021-07-21 NOTE — Progress Notes (Signed)
Pt paced halls today and would just stand in hallway glaring at the nurses station.  She was medication complaint.  She continues to be paranoid, delusional, tangential, and disorganized.  Pt remains safe on the unit at this time.    Said nurse offered to assist patient with shower and wash hair.  She declined.

## 2021-07-22 DIAGNOSIS — F332 Major depressive disorder, recurrent severe without psychotic features: Secondary | ICD-10-CM | POA: Diagnosis not present

## 2021-07-22 MED ORDER — HALOPERIDOL 2 MG PO TABS
2.0000 mg | ORAL_TABLET | Freq: Every day | ORAL | Status: DC
  Administered 2021-07-22 – 2021-07-23 (×2): 2 mg via ORAL
  Filled 2021-07-22 (×3): qty 1

## 2021-07-22 NOTE — Progress Notes (Signed)
[facility] MD Progress Note  07/22/2021 11:26 AM Becky Davis  MRN:  [id number] Subjective: Becky Davis continues to be confused and agitated at times.  Overall, she is more pleasant and not as irritable as last week.  Reading over he on-call psychiatrist who started her on Aricept because she probably does have sundowning/dementia.  She is taking her medications for the most part.  Sometimes she refuses.  Principal Problem: MDD (major depressive disorder), recurrent severe, without psychosis ([place]) Diagnosis: Principal Problem:   MDD (major depressive disorder), recurrent severe, without psychosis ([place]) Active Problems:   Depression  Total Time spent with patient: 15 minutes  Past Psychiatric History: Sees Dr. Toy Care  Past Medical History:  Past Medical History:  Diagnosis Date   Arthritis    knees   Asthma    Headache    hx of 20 years ago   Heart murmur    hx of 10-15 years ago   Hypertension    Hypothyroidism    Pneumonia    Pre-diabetes    Thyroid nodule     Past Surgical History:  Procedure Laterality Date   ABDOMINAL HYSTERECTOMY     thyroid goiter     surgery   THYROID LOBECTOMY     06-25-17 Dr. Harlow Asa   Left   THYROID LOBECTOMY Left 06/25/2017   Procedure: LEFT THYROID LOBECTOMY;  Surgeon: Armandina Gemma, MD;  Location: [facility];  Service: General;  Laterality: Left;   TOTAL KNEE ARTHROPLASTY Right 12/20/2019   Procedure: TOTAL KNEE ARTHROPLASTY;  Surgeon: Marchia Bond, MD;  Location: [facility];  Service: Orthopedics;  Laterality: Right;   Family History:  Family History  Problem Relation Age of Onset   Emphysema Mother    Cancer Mother    Heart disease Mother    Cancer Sister    Cancer Brother     Social History:  Social History   Substance and Sexual Activity  Alcohol Use No     Social History   Substance and Sexual Activity  Drug Use No    Social History   Socioeconomic History   Marital status: Married    Spouse name: Not on file   Number of children: 3    Years of education: Not on file   Highest education level: Not on file  Occupational History   Not on file  Tobacco Use   Smoking status: Never   Smokeless tobacco: Never  Vaping Use   Vaping Use: Never used  Substance and Sexual Activity   Alcohol use: No   Drug use: No   Sexual activity: Not Currently  Other Topics Concern   Not on file  Social History Narrative   Not on file   Social Determinants of Health   Financial Resource Strain: Not on file  Food Insecurity: Not on file  Transportation Needs: Not on file  Physical Activity: Not on file  Stress: Not on file  Social Connections: Not on file   Additional Social History:                         Sleep: Poor  Appetite:  Good  Current Medications: Current Facility-Administered Medications  Medication Dose Route Frequency Provider Last Rate Last Admin   acetaminophen (TYLENOL) tablet 650 mg  650 mg Oral Q6H PRN Parks Ranger, DO   650 mg at 07/19/21 2100   albuterol (PROVENTIL) (2.5 MG/3ML) 0.083% nebulizer solution 2.5 mg  2.5 mg Nebulization Q4H PRN Parks Ranger,  DO       alum & mag hydroxide-simeth (MAALOX/MYLANTA) 200-200-20 MG/5ML suspension 30 mL  30 mL Oral Q4H PRN Parks Ranger, DO       ARIPiprazole (ABILIFY) tablet 10 mg  10 mg Oral [id number] Parks Ranger, DO   10 mg at 07/22/21 0900   diphenhydrAMINE (BENADRYL) capsule 25 mg  25 mg Oral Q6H PRN Wynell Balloon, [name]       Or   diphenhydrAMINE (BENADRYL) injection 25 mg  25 mg Intramuscular Q6H PRN [name], [name]   25 mg at 07/20/21 2131   donepezil (ARICEPT) tablet 5 mg  5 mg Oral QHS [name], [name]   5 mg at 07/21/21 2127   gabapentin (NEURONTIN) capsule 200 mg  200 mg Oral TID Wynell Balloon, [name]   200 mg at 07/22/21 0258   haloperidol (HALDOL) tablet 2 mg  2 mg Oral q1600 Parks Ranger, DO       haloperidol (HALDOL) tablet 5 mg  5 mg Oral Q6H PRN Wynell Balloon, [name]       Or   haloperidol lactate (HALDOL) injection  5 mg  5 mg Intramuscular Q6H PRN Wynell Balloon, [name]       levothyroxine (SYNTHROID) tablet 100 mcg  100 mcg Oral QAC breakfast Parks Ranger, DO   100 mcg at 07/22/21 5277   loratadine (CLARITIN) tablet 10 mg  10 mg Oral Daily Parks Ranger, DO   10 mg at 07/22/21 8242   losartan (COZAAR) tablet 50 mg  50 mg Oral Daily Parks Ranger, DO   50 mg at 07/22/21 3536   magnesium hydroxide (MILK OF MAGNESIA) suspension 30 mL  30 mL Oral Daily PRN Parks Ranger, DO       montelukast (SINGULAIR) tablet 10 mg  10 mg Oral QHS Parks Ranger, DO   10 mg at 07/21/21 2127   PARoxetine (PAXIL) tablet 20 mg  20 mg Oral QHS Parks Ranger, DO   20 mg at 07/21/21 2127   QUEtiapine (SEROQUEL) tablet 200 mg  200 mg Oral QHS [name], [name]   200 mg at 07/21/21 2126   traZODone (DESYREL) tablet 50 mg  50 mg Oral QHS PRN Parks Ranger, DO   50 mg at 07/21/21 2127    Lab Results: No results found for this or any previous visit (from the past 48 hour(s)).  Blood Alcohol level:  Lab Results  Component Value Date   ETH <10 14/43/1540    Metabolic Disorder Labs: Lab Results  Component Value Date   HGBA1C 5.4 07/19/2021   MPG 108.28 07/19/2021   MPG 119.76 12/13/2019   No results found for: PROLACTIN Lab Results  Component Value Date   CHOL 178 07/19/2021   TRIG 85 07/19/2021   HDL 49 07/19/2021   CHOLHDL 3.6 07/19/2021   VLDL 17 07/19/2021   LDLCALC 112 (H) 07/19/2021    Physical Findings: AIMS:  , ,  ,  ,    CIWA:    COWS:     Musculoskeletal: Strength & Muscle Tone: within normal limits Gait & Station: normal Patient leans: N/A  Psychiatric Specialty Exam:  Presentation  General Appearance: Casual; Disheveled  Eye Contact:Fair  Speech:Pressured  Speech Volume:Increased  Handedness:No data recorded  Mood and Affect  Mood:Anxious  Affect:Appropriate   Thought Process  Thought Processes:Disorganized  Descriptions of  Associations:Loose  Orientation:Partial  Thought Content:Perseveration; Scattered  History of Schizophrenia/Schizoaffective disorder:No  Duration of Psychotic Symptoms:Greater than six months  Hallucinations:No data recorded  Ideas of Reference:Delusions  Suicidal Thoughts:No data recorded Homicidal Thoughts:No data recorded  Sensorium  Memory:Immediate Poor; Remote Poor  Judgment:Fair  Insight:Fair   Executive Functions  Concentration:Fair  Attention Span:Fair  [place]   Psychomotor Activity  Psychomotor Activity:No data recorded  Assets  Assets:Communication Skills; Desire for Improvement; Financial Resources/Insurance; Housing; Social Support   Sleep  Sleep:No data recorded   Physical Exam: Physical Exam Vitals and nursing note reviewed.  Constitutional:      Appearance: Normal appearance. She is normal weight.  Neurological:     General: No focal deficit present.     Mental Status: She is alert and oriented to person, place, and time.  Psychiatric:        Attention and Perception: Attention normal. She perceives auditory hallucinations.        Mood and Affect: Mood is anxious. Affect is flat and inappropriate.        Speech: Speech is tangential.        Behavior: Behavior is hyperactive.        Thought Content: Thought content is paranoid and delusional.        Cognition and Memory: Cognition is impaired. Memory is impaired.        Judgment: Judgment is impulsive and inappropriate.   Review of Systems  Constitutional: Negative.   HENT: Negative.    Eyes: Negative.   Respiratory: Negative.    Cardiovascular: Negative.   Gastrointestinal: Negative.   Genitourinary: Negative.   Musculoskeletal: Negative.   Skin: Negative.   Neurological: Negative.   Endo/Heme/Allergies: Negative.   Psychiatric/Behavioral:  Positive for depression and hallucinations. The patient is nervous/anxious and has insomnia.    Blood pressure 128/60, pulse 82, temperature 97.8 F (36.6 C), temperature source Oral, resp. rate 20, height 5\' 3"  (1.6 m), weight 87.5 kg, SpO2 99 %. Body mass index is 34.17 kg/m.   Treatment Plan Summary: Daily contact with patient to assess and evaluate symptoms and progress in treatment, Medication management, and Plan continue commitment for another 14 days.  Scheduled Haldol 2 mg at 4:00 daily.  Parks Ranger, DO 07/22/2021, 11:26 AM

## 2021-07-22 NOTE — Progress Notes (Signed)
Patient is oriented to person but disoriented to time, place and situation. Pt continues to have paranoid delusions and is impulsive. Pt is observed pacing the hallways. Pt refused medications at first but then came back to take them. Pt denies pain. Pt remains safe on the unit at this time.

## 2021-07-22 NOTE — Progress Notes (Signed)
Recreation Therapy Notes  Date: 07/22/2021   Time: 1:30 pm     Location: Craft room     Behavioral response: N/A   Intervention Topic: Creative Expressions   Discussion/Intervention: Patient did not attend group.   Clinical Observations/Feedback:  Patient did not attend group.   Areliz Rothman LRT/CTRS        Alaa Mullally 07/22/2021 2:27 PM

## 2021-07-22 NOTE — Plan of Care (Signed)
Patient presents pacing in hallway disheveled with poor hygiene and body odor.  Patient is alert and oriented to person but disoriented to time, place and situation. Affect is pleasant, calm and cooperative.  Thoughts are delusional, tangential and disorganized.  Denies anxiety, depression, SI, HI or AVH.  VSS.  Pt pacing in hallway most of morning while resting in room this afternoon.  Offered shower - pt refuses. Patient compliant with all medication.  VSS.  Will continue Q 15 minute safety rounds per unit policy.  Problem: Education: Goal: Knowledge of [facility] Education information/materials will improve Outcome: Progressing Goal: Emotional status will improve Outcome: Progressing Goal: Mental status will improve Outcome: Progressing Goal: Verbalization of understanding the information provided will improve Outcome: Progressing   Problem: Education: Goal: Will be free of psychotic symptoms Outcome: Progressing   Problem: Health Behavior/Discharge Planning: Goal: Compliance with prescribed medication regimen will improve Outcome: Progressing   Problem: Activity: Goal: Will verbalize the importance of balancing activity with adequate rest periods Outcome: Not Progressing   Problem: Role Relationship: Goal: Ability to interact with others will improve Outcome: Not Progressing   Problem: Self-Care: Goal: Ability to participate in self-care as condition permits will improve Outcome: Not Progressing

## 2021-07-23 DIAGNOSIS — F332 Major depressive disorder, recurrent severe without psychotic features: Secondary | ICD-10-CM | POA: Diagnosis not present

## 2021-07-23 MED ORDER — ARIPIPRAZOLE 5 MG PO TABS
10.0000 mg | ORAL_TABLET | Freq: Every day | ORAL | Status: DC
  Administered 2021-07-24: 10 mg via ORAL
  Filled 2021-07-23: qty 2

## 2021-07-23 NOTE — Progress Notes (Signed)
Pt ambulating in hallway with walker; calm, cooperative. Pt states "My leg is a little painful." Pt c/o LLE pain and points to knee, indicating pain location. She describes pain as "sharp" and "comes and goes" and rates pain 3/10. She says that she awoke with the pain. Pt denies SI/HI/AVH at this time. Pt reports that she has slept "good" for the last 2-3 nights and describes her appetite as "good". Pt denies anxiety and depression at this time. She c/o "shivers" during the night and believes that it is due to seroquel or "possibly" haloperidol; she states that she is not taking seroquel tonight. She states "I started having issues since I started taking those meds." No acute distress noted.

## 2021-07-23 NOTE — Progress Notes (Signed)
Patient alert and oriented x 3 with periods of confusion to situation she appears restless, affect is blunted thoughts are disorganized, no distress noted, she denies SI/HI/AVH. 15  minutes safety checks maintained will continue to monitor.

## 2021-07-23 NOTE — Progress Notes (Signed)
Recreation Therapy Notes   Date: 07/23/2021  Time: 1:15pm    Location: Craft room    Behavioral response: N/A   Intervention Topic: Problem Solving   Discussion/Intervention: Patient did not attend group.  Clinical Observations/Feedback:  Patient did not attend group.   Alvine Mostafa LRT/CTRS        Rehmat Murtagh 07/23/2021 4:08 PM

## 2021-07-23 NOTE — Progress Notes (Signed)
Patient resistant to taking evening meds and required significant amount of encouragement.  Patient finally acquiesced and took medication reluctantly.  Pacing hallways ambulating with rolling walker.

## 2021-07-23 NOTE — Progress Notes (Addendum)
°   07/23/21 1050  Clinical Encounter Type  Visited With Patient  Visit Type Initial;Social support   Chaplain visited with patient about social events and an opportunity given to socially connect.

## 2021-07-23 NOTE — Progress Notes (Signed)
[facility] MD Progress Note  07/23/2021 1:17 PM Becky Davis  MRN:  [id number] Subjective: Becky Davis's behavior is improved.  She is still having trouble sleeping at night.  Her blood pressure is also been low.  She is gaining a little bit more insight and tells me that her mom and dad both had Alzheimer's.  She states that she has had some memory problems.  She is being more cooperative.  She is taking her medications as prescribed and denies any side effects.  There is no evidence of EPS or TD.  We will continue to make adjustments.  Principal Problem: MDD (major depressive disorder), recurrent severe, without psychosis (Becky Davis) Diagnosis: Principal Problem:   MDD (major depressive disorder), recurrent severe, without psychosis (Becky Davis) Active Problems:   Depression  Total Time spent with patient: 15 minutes  Past Psychiatric History: Dr. Toy Care  Past Medical History:  Past Medical History:  Diagnosis Date   Arthritis    knees   Asthma    Headache    hx of 20 years ago   Heart murmur    hx of 10-15 years ago   Hypertension    Hypothyroidism    Pneumonia    Pre-diabetes    Thyroid nodule     Past Surgical History:  Procedure Laterality Date   ABDOMINAL HYSTERECTOMY     thyroid goiter     surgery   THYROID LOBECTOMY     06-25-17 Dr. Harlow Asa   Left   THYROID LOBECTOMY Left 06/25/2017   Procedure: LEFT THYROID LOBECTOMY;  Surgeon: Armandina Gemma, MD;  Location: [facility];  Service: General;  Laterality: Left;   TOTAL KNEE ARTHROPLASTY Right 12/20/2019   Procedure: TOTAL KNEE ARTHROPLASTY;  Surgeon: Marchia Bond, MD;  Location: [facility];  Service: Orthopedics;  Laterality: Right;   Family History:  Family History  Problem Relation Age of Onset   Emphysema Mother    Cancer Mother    Heart disease Mother    Cancer Sister    Cancer Brother     Social History:  Social History   Substance and Sexual Activity  Alcohol Use No     Social History   Substance and Sexual Activity  Drug Use No     Social History   Socioeconomic History   Marital status: Married    Spouse name: Not on file   Number of children: 3   Years of education: Not on file   Highest education level: Not on file  Occupational History   Not on file  Tobacco Use   Smoking status: Never   Smokeless tobacco: Never  Vaping Use   Vaping Use: Never used  Substance and Sexual Activity   Alcohol use: No   Drug use: No   Sexual activity: Not Currently  Other Topics Concern   Not on file  Social History Narrative   Not on file   Social Determinants of Health   Financial Resource Strain: Not on file  Food Insecurity: Not on file  Transportation Needs: Not on file  Physical Activity: Not on file  Stress: Not on file  Social Connections: Not on file   Additional Social History:                         Sleep: Fair  Appetite:  Good  Current Medications: Current Facility-Administered Medications  Medication Dose Route Frequency Provider Last Rate Last Admin   acetaminophen (TYLENOL) tablet 650 mg  650 mg Oral Q6H  PRN Parks Ranger, DO   650 mg at 07/19/21 2100   albuterol (PROVENTIL) (2.5 MG/3ML) 0.083% nebulizer solution 2.5 mg  2.5 mg Nebulization Q4H PRN Parks Ranger, DO       alum & mag hydroxide-simeth (MAALOX/MYLANTA) 200-200-20 MG/5ML suspension 30 mL  30 mL Oral Q4H PRN Parks Ranger, DO       [START ON 07/24/2021] ARIPiprazole (ABILIFY) tablet 10 mg  10 mg Oral QPC breakfast Parks Ranger, DO       diphenhydrAMINE (BENADRYL) capsule 25 mg  25 mg Oral Q6H PRN Wynell Balloon, [name]       Or   diphenhydrAMINE (BENADRYL) injection 25 mg  25 mg Intramuscular Q6H PRN [name], [name]   25 mg at 07/20/21 2131   donepezil (ARICEPT) tablet 5 mg  5 mg Oral QHS [name], [name]   5 mg at 07/22/21 2100   gabapentin (NEURONTIN) capsule 200 mg  200 mg Oral TID Wynell Balloon, [name]   200 mg at 07/23/21 1024   haloperidol (HALDOL) tablet 2 mg  2 mg Oral q1600 Parks Ranger, DO   2 mg at 07/22/21 1608   haloperidol (HALDOL) tablet 5 mg  5 mg Oral Q6H PRN Antonieta Pert       Or   haloperidol lactate (HALDOL) injection 5 mg  5 mg Intramuscular Q6H PRN Wynell Balloon, [name]       levothyroxine (SYNTHROID) tablet 100 mcg  100 mcg Oral QAC breakfast Parks Ranger, DO   100 mcg at 07/23/21 4580   loratadine (CLARITIN) tablet 10 mg  10 mg Oral Daily Parks Ranger, DO   10 mg at 07/22/21 9983   magnesium hydroxide (MILK OF MAGNESIA) suspension 30 mL  30 mL Oral Daily PRN Parks Ranger, DO       montelukast (SINGULAIR) tablet 10 mg  10 mg Oral QHS Parks Ranger, DO   10 mg at 07/22/21 2100   PARoxetine (PAXIL) tablet 20 mg  20 mg Oral QHS Parks Ranger, DO   20 mg at 07/22/21 2100   QUEtiapine (SEROQUEL) tablet 200 mg  200 mg Oral QHS [name], [name]   200 mg at 07/22/21 2100    Lab Results: No results found for this or any previous visit (from the past 48 hour(s)).  Blood Alcohol level:  Lab Results  Component Value Date   ETH <10 38/25/0539    Metabolic Disorder Labs: Lab Results  Component Value Date   HGBA1C 5.4 07/19/2021   MPG 108.28 07/19/2021   MPG 119.76 12/13/2019   No results found for: PROLACTIN Lab Results  Component Value Date   CHOL 178 07/19/2021   TRIG 85 07/19/2021   HDL 49 07/19/2021   CHOLHDL 3.6 07/19/2021   VLDL 17 07/19/2021   LDLCALC 112 (H) 07/19/2021    Physical Findings: AIMS:  , ,  ,  ,    CIWA:    COWS:     Musculoskeletal: Strength & Muscle Tone: within normal limits Gait & Station: normal Patient leans: N/A  Psychiatric Specialty Exam:  Presentation  General Appearance: Casual; Disheveled  Eye Contact:Fair  Speech:Pressured  Speech Volume:Increased  Handedness:No data recorded  Mood and Affect  Mood:Anxious  Affect:Appropriate   Thought Process  Thought Processes:Disorganized  Descriptions of Associations:Loose  Orientation:Partial  Thought  Content:Perseveration; Scattered  History of Schizophrenia/Schizoaffective disorder:No  Duration of Psychotic Symptoms:Greater than six months  Hallucinations:No data recorded Ideas of Reference:Delusions  Suicidal Thoughts:No data recorded Homicidal Thoughts:No data  recorded  Sensorium  Memory:Immediate Poor; Remote Poor  Judgment:Fair  Insight:Fair   Executive Functions  Concentration:Fair  Attention Span:Fair  [place]   Psychomotor Activity  Psychomotor Activity:No data recorded  Assets  Assets:Communication Skills; Desire for Improvement; Financial Resources/Insurance; Housing; Social Support   Sleep  Sleep:No data recorded   Physical Exam: Physical Exam Vitals and nursing note reviewed.  Constitutional:      Appearance: Normal appearance. She is normal weight.  Neurological:     General: No focal deficit present.     Mental Status: She is alert and oriented to person, place, and time.  Psychiatric:        Attention and Perception: Attention and perception normal.        Mood and Affect: Mood and affect normal.        Speech: Speech is tangential.        Behavior: Behavior normal. Behavior is cooperative.        Thought Content: Thought content is delusional.        Cognition and Memory: Cognition is impaired. Memory is impaired.        Judgment: Judgment is impulsive.   Review of Systems  Constitutional: Negative.   HENT: Negative.    Eyes: Negative.   Respiratory: Negative.    Cardiovascular: Negative.   Gastrointestinal: Negative.   Genitourinary: Negative.   Musculoskeletal: Negative.   Skin: Negative.   Neurological: Negative.   Endo/Heme/Allergies: Negative.   Psychiatric/Behavioral:  Positive for hallucinations.   Blood pressure 128/60, pulse 82, temperature 97.8 F (36.6 C), temperature source Oral, resp. rate 20, height 5\' 3"  (1.6 m), weight 87.5 kg, SpO2 99 %. Body mass index is 34.17  kg/m.   Treatment Plan Summary: Daily contact with patient to assess and evaluate symptoms and progress in treatment, Medication management, and Plan discontinue Cozaar.  Change Abilify to 10 mg in the morning only as we titrate her afternoon Haldol.  Discontinue trazodone at night and continue Seroquel 200 mg at bedtime.  Continue Paxil 20 mg/day.  Parks Ranger, DO 07/23/2021, 1:17 PM

## 2021-07-23 NOTE — Progress Notes (Signed)
Pt at nursing station requesting nighttime medications after observing another nurse giving another pt medications. Pt refused seroquel without explanation and requested 2 tylenol for LLE pain. After taking one tylenol, pt refused the other. Pt appeared to be "cheeking" medications; however, pt instructed to open mouth and move tongue around. Upon inspection, no medications were observed in pt's mouth. Pt took medications without incident after requesting and receiving explanation for each medication taken.

## 2021-07-23 NOTE — Plan of Care (Addendum)
Patient presents this am lethargic sitting in day room with eyes closed.   Patient states, "That Seroquel makes me confused and too sleepy."  Patient appearance is unremarkable except poor hygiene and body odor noted.  Patient is oriented to person, place and time but disoriented to situation.  Affect is pleasant, calm and cooperative.  Thoughts are delusional, tangential and disorganized.  Denies anxiety, depression, SI, HI or AVH.  VSS.  Mid morning patient more alert and began pacing in hallway fixated on "I need to get exercise because I have myasthenia gravis." Offered shower - pt refuses. Patient compliant with all medication except Claritin.  Held Cozaar due to SBP < 100.  MD made aware.  Will continue Q 15 minute safety rounds per unit policy.   Problem: Education: Goal: Knowledge of [facility] Education information/materials will improve Outcome: Progressing Goal: Emotional status will improve Outcome: Progressing Goal: Mental status will improve Outcome: Progressing Goal: Verbalization of understanding the information provided will improve Outcome: Progressing   Problem: Activity: Goal: Sleeping patterns will improve Outcome: Progressing   Problem: Health Behavior/Discharge Planning: Goal: Compliance with treatment plan for underlying cause of condition will improve Outcome: Progressing

## 2021-07-24 DIAGNOSIS — F332 Major depressive disorder, recurrent severe without psychotic features: Secondary | ICD-10-CM | POA: Diagnosis not present

## 2021-07-24 MED ORDER — LORAZEPAM 1 MG PO TABS: 2.0000 mg | ORAL_TABLET | Freq: Once | ORAL | Status: AC

## 2021-07-24 MED ORDER — DIPHENHYDRAMINE HCL 50 MG/ML IJ SOLN
50.0000 mg | Freq: Once | INTRAMUSCULAR | Status: AC
  Administered 2021-07-24: 50 mg via INTRAMUSCULAR

## 2021-07-24 MED ORDER — LORAZEPAM 2 MG/ML IJ SOLN
2.0000 mg | Freq: Once | INTRAMUSCULAR | Status: AC
Start: 2021-07-24 — End: 2021-07-24
  Administered 2021-07-24: 2 mg via INTRAMUSCULAR

## 2021-07-24 MED ORDER — HALOPERIDOL 2 MG PO TABS
2.0000 mg | ORAL_TABLET | ORAL | Status: DC
  Administered 2021-07-24 – 2021-07-25 (×2): 2 mg via ORAL
  Filled 2021-07-24 (×3): qty 1

## 2021-07-24 MED ORDER — CHLORPROMAZINE HCL 25 MG PO TABS
25.0000 mg | ORAL_TABLET | Freq: Every evening | ORAL | Status: DC | PRN
  Filled 2021-07-24: qty 1

## 2021-07-24 MED ORDER — MIRTAZAPINE 15 MG PO TABS
15.0000 mg | ORAL_TABLET | Freq: Every day | ORAL | Status: DC
  Administered 2021-07-25 – 2021-08-05 (×12): 15 mg via ORAL
  Filled 2021-07-24 (×13): qty 1

## 2021-07-24 MED ORDER — LORAZEPAM 2 MG/ML IJ SOLN
INTRAMUSCULAR | Status: AC
  Filled 2021-07-24: qty 1

## 2021-07-24 NOTE — BH IP Treatment Plan (Addendum)
Interdisciplinary Treatment and Diagnostic Plan Update  07/24/2021 Time of Session: 9:30AM Becky Davis MRN: [id number]  Principal Diagnosis: MDD (major depressive disorder), recurrent severe, without psychosis ([place])  Secondary Diagnoses: Principal Problem:   MDD (major depressive disorder), recurrent severe, without psychosis ([place]) Active Problems:   Depression   Current Medications:  Current Facility-Administered Medications  Medication Dose Route Frequency Provider Last Rate Last Admin   acetaminophen (TYLENOL) tablet 650 mg  650 mg Oral Q6H PRN Parks Ranger, DO   325 mg at 07/23/21 2114   albuterol (PROVENTIL) (2.5 MG/3ML) 0.083% nebulizer solution 2.5 mg  2.5 mg Nebulization Q4H PRN Parks Ranger, DO       alum & mag hydroxide-simeth (MAALOX/MYLANTA) 200-200-20 MG/5ML suspension 30 mL  30 mL Oral Q4H PRN Parks Ranger, DO       ARIPiprazole (ABILIFY) tablet 10 mg  10 mg Oral QPC breakfast Parks Ranger, DO   10 mg at 07/24/21 0948   diphenhydrAMINE (BENADRYL) capsule 25 mg  25 mg Oral Q6H PRN Wynell Balloon, [name]       Or   diphenhydrAMINE (BENADRYL) injection 25 mg  25 mg Intramuscular Q6H PRN [name], [name]   25 mg at 07/24/21 0441   donepezil (ARICEPT) tablet 5 mg  5 mg Oral QHS [name], [name]   5 mg at 07/23/21 2116   gabapentin (NEURONTIN) capsule 200 mg  200 mg Oral TID Wynell Balloon, [name]   200 mg at 07/24/21 0948   haloperidol (HALDOL) tablet 2 mg  2 mg Oral q1600 Parks Ranger, DO   2 mg at 07/23/21 1641   haloperidol (HALDOL) tablet 5 mg  5 mg Oral Q6H PRN Wynell Balloon, [name]       Or   haloperidol lactate (HALDOL) injection 5 mg  5 mg Intramuscular Q6H PRN [name], [name]   5 mg at 07/24/21 0441   levothyroxine (SYNTHROID) tablet 100 mcg  100 mcg Oral QAC breakfast Parks Ranger, DO   100 mcg at 07/23/21 0659   loratadine (CLARITIN) tablet 10 mg  10 mg Oral Daily Parks Ranger, DO   10 mg at 07/24/21 0948   magnesium  hydroxide (MILK OF MAGNESIA) suspension 30 mL  30 mL Oral Daily PRN Parks Ranger, DO       montelukast (SINGULAIR) tablet 10 mg  10 mg Oral QHS Parks Ranger, DO   10 mg at 07/23/21 2116   PARoxetine (PAXIL) tablet 20 mg  20 mg Oral QHS Parks Ranger, DO   20 mg at 07/23/21 2116   QUEtiapine (SEROQUEL) tablet 200 mg  200 mg Oral QHS [name], [name]   200 mg at 07/22/21 2100   PTA Medications: Medications Prior to Admission  Medication Sig Dispense Refill Last Dose   albuterol (PROAIR HFA) 108 (90 Base) MCG/ACT inhaler Inhale 2 puffs into the lungs as needed (Shortness of breath). 6.7 g 3 07/17/2021   albuterol (PROVENTIL) (2.5 MG/3ML) 0.083% nebulizer solution Take 2.5 mg by nebulization every 4 (four) hours as needed for wheezing or shortness of breath.   07/17/2021   ARIPiprazole (ABILIFY) 5 MG tablet Take 5 mg by mouth at bedtime.    07/17/2021   budesonide-formoterol (SYMBICORT) 160-4.5 MCG/ACT inhaler Inhale 2 puffs into the lungs 2 (two) times daily. 1 Inhaler 6 07/17/2021   calcitRIOL (ROCALTROL) 0.25 MCG capsule Take 0.25 mcg by mouth daily.   07/17/2021   Calcium Carbonate-Vitamin D (CALTRATE 600+D PO) Take 500 mg by mouth 2 (two) times  daily.    07/17/2021   cetirizine (ZYRTEC) 10 MG tablet Take 10 mg by mouth daily.   07/17/2021   gabapentin (NEURONTIN) 100 MG capsule Take 100 mg by mouth 3 (three) times daily.   07/17/2021   hydrALAZINE (APRESOLINE) 10 MG tablet Take 5 mg by mouth 3 (three) times daily.   07/17/2021   levothyroxine (SYNTHROID, LEVOTHROID) 100 MCG tablet Take 100 mcg by mouth daily before breakfast.   07/17/2021   losartan (COZAAR) 100 MG tablet Take 0.5 tablets (50 mg total) by mouth daily. 5 tablet 0 07/17/2021   montelukast (SINGULAIR) 10 MG tablet Take 10 mg by mouth at bedtime.   07/17/2021   PARoxetine (PAXIL) 20 MG tablet Take 20 mg by mouth at bedtime.    07/17/2021   aspirin EC 325 MG tablet Take 1 tablet (325 mg total) by mouth 2 (two) times daily. 60  tablet 0    baclofen (LIORESAL) 10 MG tablet Take 1 tablet (10 mg total) by mouth 3 (three) times daily. As needed for muscle spasm (Patient taking differently: Take 10 mg by mouth 2 (two) times daily. As needed for muscle spasm) 50 tablet 0    HYDROcodone-acetaminophen (NORCO) 10-325 MG tablet Take 1 tablet by mouth every 6 (six) hours as needed. 28 tablet 0    sennosides-docusate sodium (SENOKOT-S) 8.6-50 MG tablet Take 2 tablets by mouth daily. 30 tablet 1     Patient Stressors:    Patient Strengths: Supportive family/friends   Treatment Modalities: Medication Management, Group therapy, Case management,  1 to 1 session with clinician, Psychoeducation, Recreational therapy.   Physician Treatment Plan for Primary Diagnosis: MDD (major depressive disorder), recurrent severe, without psychosis ([place]) Long Term Goal(s): Improvement in symptoms so as ready for discharge   Short Term Goals: Ability to identify changes in lifestyle to reduce recurrence of condition will improve Ability to verbalize feelings will improve Ability to disclose and discuss suicidal ideas Ability to demonstrate self-control will improve Ability to identify and develop effective coping behaviors will improve Ability to maintain clinical measurements within normal limits will improve Compliance with prescribed medications will improve Ability to identify triggers associated with substance abuse/mental health issues will improve  Medication Management: Evaluate patient's response, side effects, and tolerance of medication regimen.  Therapeutic Interventions: 1 to 1 sessions, Unit Group sessions and Medication administration.  Evaluation of Outcomes: Not Progressing  Physician Treatment Plan for Secondary Diagnosis: Principal Problem:   MDD (major depressive disorder), recurrent severe, without psychosis ([place]) Active Problems:   Depression  Long Term Goal(s): Improvement in symptoms so as ready for discharge    Short Term Goals: Ability to identify changes in lifestyle to reduce recurrence of condition will improve Ability to verbalize feelings will improve Ability to disclose and discuss suicidal ideas Ability to demonstrate self-control will improve Ability to identify and develop effective coping behaviors will improve Ability to maintain clinical measurements within normal limits will improve Compliance with prescribed medications will improve Ability to identify triggers associated with substance abuse/mental health issues will improve     Medication Management: Evaluate patient's response, side effects, and tolerance of medication regimen.  Therapeutic Interventions: 1 to 1 sessions, Unit Group sessions and Medication administration.  Evaluation of Outcomes: Not Progressing   RN Treatment Plan for Primary Diagnosis: MDD (major depressive disorder), recurrent severe, without psychosis ([place]) Long Term Goal(s): Knowledge of disease and therapeutic regimen to maintain health will improve  Short Term Goals: Ability to remain free from injury will improve,  Ability to verbalize frustration and anger appropriately will improve, Ability to demonstrate self-control, Ability to participate in decision making will improve, Ability to verbalize feelings will improve, Ability to identify and develop effective coping behaviors will improve, and Compliance with prescribed medications will improve  Medication Management: RN will administer medications as ordered by provider, will assess and evaluate patient's response and provide education to patient for prescribed medication. RN will report any adverse and/or side effects to prescribing provider.  Therapeutic Interventions: 1 on 1 counseling sessions, Psychoeducation, Medication administration, Evaluate responses to treatment, Monitor vital signs and CBGs as ordered, Perform/monitor CIWA, COWS, AIMS and Fall Risk screenings as ordered, Perform wound care  treatments as ordered.  Evaluation of Outcomes: Not Progressing   LCSW Treatment Plan for Primary Diagnosis: MDD (major depressive disorder), recurrent severe, without psychosis ([place]) Long Term Goal(s): Safe transition to appropriate next level of care at discharge, Engage patient in therapeutic group addressing interpersonal concerns.  Short Term Goals: Engage patient in aftercare planning with referrals and resources, Increase social support, Increase ability to appropriately verbalize feelings, Increase emotional regulation, Facilitate acceptance of mental health diagnosis and concerns, Facilitate patient progression through stages of change regarding substance use diagnoses and concerns, and Increase skills for wellness and recovery  Therapeutic Interventions: Assess for all discharge needs, 1 to 1 time with Social worker, Explore available resources and support systems, Assess for adequacy in community support network, Educate family and significant other(s) on suicide prevention, Complete Psychosocial Assessment, Interpersonal group therapy.  Evaluation of Outcomes: Not Progressing   Progress in Treatment: Attending groups: No. Participating in groups: No. Taking medication as prescribed: No. Toleration medication: Yes. Family/Significant other contact made: Yes, individual(s) contacted:  pt's daughter and pt's partner Patient understands diagnosis: No. Discussing patient identified problems/goals with staff: Yes. Medical problems stabilized or resolved: Yes. Denies suicidal/homicidal ideation: Yes. Issues/concerns per patient self-inventory: No. Other: None  New problem(s) identified: No, Describe:  None  New Short Term/Long Term Goal(s): Patient to work towards elimination of symptoms of psychosis, medication management for mood stabilization; development of comprehensive mental wellness/sobriety plan. Update 07/24/21: No changes at this time.      Patient Goals:  "to get out  of here.Marland Kitchen exercise, lose 5 pounds" Update 07/24/21: No changes at this time.    Discharge Plan or Barriers: CSW will assist pt in development of appropriate discharge/aftercare plan. Update 07/24/21: No changes at this time.    Reason for Continuation of Hospitalization: Aggression Delusions  Hallucinations Medication stabilization   Estimated Length of Stay: TBD     Scribe for Treatment Team: Tonna Palazzi A [place], Latanya Presser 07/24/2021 10:42 AM

## 2021-07-24 NOTE — Progress Notes (Signed)
[facility] MD Progress Note  07/24/2021 10:44 AM Becky Davis  MRN:  [id number] Subjective: Becky Davis continues to be confused and agitated at times.  The nurses found pills that her room so she is obviously been cheeking them.  She is still not sleeping very well and they stated that she slept 1 hour last night.  Seroquel does not seem to be helping.  The Haldol has helped the most as far as her thought process and behavior.  So, we will make some changes.  Her thinking is still very delusional and tangential and does not make a lot of sense at times.  She seems to be confabulating a lot.  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 Toy Care  Past Medical History:  Past Medical History:  Diagnosis Date   Arthritis    knees   Asthma    Headache    hx of 20 years ago   Heart murmur    hx of 10-15 years ago   Hypertension    Hypothyroidism    Pneumonia    Pre-diabetes    Thyroid nodule     Past Surgical History:  Procedure Laterality Date   ABDOMINAL HYSTERECTOMY     thyroid goiter     surgery   THYROID LOBECTOMY     06-25-17 Dr. Harlow Asa   Left   THYROID LOBECTOMY Left 06/25/2017   Procedure: LEFT THYROID LOBECTOMY;  Surgeon: Armandina Gemma, MD;  Location: [facility];  Service: General;  Laterality: Left;   TOTAL KNEE ARTHROPLASTY Right 12/20/2019   Procedure: TOTAL KNEE ARTHROPLASTY;  Surgeon: Marchia Bond, MD;  Location: [facility];  Service: Orthopedics;  Laterality: Right;   Family History:  Family History  Problem Relation Age of Onset   Emphysema Mother    Cancer Mother    Heart disease Mother    Cancer Sister    Cancer Brother    Social  History:  Social History   Substance and Sexual Activity  Alcohol Use No     Social History   Substance and Sexual Activity  Drug Use No    Social History   Socioeconomic History   Marital status: Married    Spouse name: Not on file   Number of children: 3   Years of education: Not on file   Highest education level: Not on file  Occupational History   Not on file  Tobacco Use   Smoking status: Never   Smokeless tobacco: Never  Vaping Use   Vaping Use: Never used  Substance and Sexual Activity   Alcohol use: No   Drug use: No   Sexual activity: Not Currently  Other Topics Concern   Not on file  Social History Narrative   Not on file   Social Determinants of Health   Financial Resource Strain: Not on file  Food Insecurity: Not on file  Transportation Needs: Not on file  Physical Activity: Not on file  Stress: Not on file  Social Connections: Not on file   Additional Social History:                         Sleep: Poor  Appetite:  Good  Current Medications: Current Facility-Administered Medications  Medication Dose Route Frequency Provider Last Rate Last Admin   acetaminophen (TYLENOL) tablet 650 mg  650 mg Oral Q6H PRN Parks Ranger, DO   325 mg at 07/23/21 2114   albuterol (PROVENTIL) (2.5 MG/3ML) 0.083% nebulizer solution 2.5 mg  2.5 mg Nebulization Q4H PRN Parks Ranger, DO       alum & mag hydroxide-simeth (MAALOX/MYLANTA) 200-200-20 MG/5ML suspension 30 mL  30 mL Oral Q4H PRN Parks Ranger, DO       chlorproMAZINE (THORAZINE) tablet 25 mg  25 mg Oral QHS PRN Parks Ranger, DO       diphenhydrAMINE (BENADRYL) capsule 25 mg  25 mg Oral Q6H PRN Wynell Balloon, [name]       Or   diphenhydrAMINE (BENADRYL) injection 25 mg  25 mg Intramuscular Q6H PRN [name], [name]   25 mg at 07/24/21 0441   donepezil (ARICEPT) tablet 5 mg  5 mg Oral QHS [name], [name]   5 mg at 07/23/21 2116   gabapentin (NEURONTIN) capsule 200 mg  200  mg Oral TID Wynell Balloon, [name]   200 mg at 07/24/21 0948   haloperidol (HALDOL) tablet 2 mg  2 mg Oral [id number] Parks Ranger, DO       haloperidol (HALDOL) tablet 5 mg  5 mg Oral Q6H PRN Wynell Balloon, [name]       Or   haloperidol lactate (HALDOL) injection 5 mg  5 mg Intramuscular Q6H PRN Wynell Balloon, [name]   5 mg at 07/24/21 0441   levothyroxine (SYNTHROID) tablet 100 mcg  100 mcg Oral QAC breakfast Parks Ranger, DO   100 mcg at 07/23/21 3762   loratadine (CLARITIN) tablet 10 mg  10 mg Oral Daily Parks Ranger, DO   10 mg at 07/24/21 8315   magnesium hydroxide (MILK OF MAGNESIA) suspension 30 mL  30 mL Oral Daily PRN Parks Ranger, DO       mirtazapine (REMERON) tablet 15 mg  15 mg Oral QHS Anaisha Mago Edward, DO       montelukast (SINGULAIR) tablet 10 mg  10 mg Oral QHS Parks Ranger, DO   10 mg at 07/23/21 2116    Lab Results: No results found for this or any previous visit (from the past 48 hour(s)).  Blood Alcohol level:  Lab Results  Component Value Date   ETH <10 17/61/6073    Metabolic Disorder Labs: Lab Results  Component Value Date   HGBA1C 5.4 07/19/2021   MPG 108.28 07/19/2021   MPG 119.76 12/13/2019   No results found for: PROLACTIN Lab Results  Component Value Date   CHOL 178 07/19/2021   TRIG 85 07/19/2021   HDL 49 07/19/2021   CHOLHDL 3.6 07/19/2021   VLDL 17 07/19/2021   LDLCALC 112 (H) 07/19/2021    Physical Findings: AIMS:  , ,  ,  ,    CIWA:    COWS:     Musculoskeletal: Strength & Muscle Tone: within normal limits Gait & Station: normal Patient leans: N/A  Psychiatric Specialty Exam:  Presentation  General Appearance: Casual; Disheveled  Eye Contact:Fair  Speech:Pressured  Speech Volume:Increased  Handedness:No data recorded  Mood and Affect  Mood:Anxious  Affect:Appropriate   Thought Process  Thought Processes:Disorganized  Descriptions of  Associations:Loose  Orientation:Partial  Thought Content:Perseveration; Scattered  History of Schizophrenia/Schizoaffective disorder:No  Duration of Psychotic Symptoms:Greater than six months  Hallucinations:No data recorded Ideas of Reference:Delusions  Suicidal Thoughts:No data recorded Homicidal Thoughts:No data recorded  Sensorium  Memory:Immediate Poor;  Remote Poor  Judgment:Fair  Insight:Fair   Executive Functions  Concentration:Fair  Attention Span:Fair  [place]   Psychomotor Activity  Psychomotor Activity:No data recorded  Assets  Assets:Communication Skills; Desire for Improvement; Financial Resources/Insurance; Housing; Social Support   Sleep  Sleep:No data recorded   Physical Exam: Physical Exam Vitals and nursing note reviewed.  Constitutional:      Appearance: Normal appearance. She is normal weight.  Neurological:     General: No focal deficit present.     Mental Status: She is alert and oriented to person, place, and time.  Psychiatric:        Attention and Perception: Attention and perception normal.        Mood and Affect: Mood is anxious. Affect is inappropriate.        Speech: Speech is tangential.        Behavior: Behavior is hyperactive.        Thought Content: Thought content is delusional.        Cognition and Memory: Cognition is impaired. Memory is impaired.        Judgment: Judgment is inappropriate.   Review of Systems  Constitutional: Negative.   HENT: Negative.    Eyes: Negative.   Respiratory: Negative.    Cardiovascular: Negative.   Gastrointestinal: Negative.   Genitourinary: Negative.   Musculoskeletal: Negative.   Skin: Negative.   Neurological: Negative.   Endo/Heme/Allergies: Negative.   Psychiatric/Behavioral:  Positive for memory loss. The patient has insomnia.   Blood pressure 133/89, pulse 81, temperature 97.8 F (36.6 C), temperature source Oral, resp. rate  18, height 5\' 3"  (1.6 m), weight 87.5 kg, SpO2 97 %. Body mass index is 34.17 kg/m.   Treatment Plan Summary: Daily contact with patient to assess and evaluate symptoms and progress in treatment, Medication management, and Plan discontinue Seroquel and Paxil.  Start Remeron and increase Haldol to 2 mg twice a day.  Discontinue Abilify.  I know the family wanted her to get a long-acting injection of Abilify but I think that might end up being Haldol decanoate.  We will see how she does on it.  In the meantime, Thorazine 25 mg at bedtime as needed for sleep.  Parks Ranger, DO 07/24/2021, 10:44 AM

## 2021-07-24 NOTE — BHH Counselor (Signed)
CSW spoke with pt's husband Jone Panebianco, [phone]  and daughter Geralyn Flash, [phone].   They stated the pt's psychiatrist Dr. Toy Care was also on board for pt to receive Abilify injection as soon as possible due to pt's medication refusal and "cheeking" of Abilify and other medications.   CSW was also informed by Herbie Baltimore that both he and Nira Conn are pt's power of attorney and can be contacted if any medical needs arise.  Herbie Baltimore also stated that daughter Nira Conn would be visiting pt when he cannot visit on the unit during pt's visiting hours.   In terms of discharge planning, family informed CSW that they are interested in Home health care assistance for supervising and assisting with pt care at home. He said they are also considering [facility] for an assisted living options with pt. He said that financially they would pay for any gaps that Medicare would not cover.   CSW will follow up on [facility] in network with pt's insurance and make referrals as needed. CSW will inform provider of Abilify injection corroboration with pt''s current psychiatrist and family consent.   No other requests were made. Conversation ended without incident.

## 2021-07-24 NOTE — Progress Notes (Signed)
Pt standing at nurses station; calm, cooperative. Pt states "I'm much, much better" when asked how she feels. Pt denies pain and SI/HI/AVH at this time. She says that the only time she feels like her mind is playing tricks on her is when she is sick with covid; she was last sick with it in November. Pt reports that she slept "pretty good" last night and says that she does not sleep well when she falls asleep on her husband's shoulder about 6:30p, goes to bed around 7:30p and wakes up around 2 or 3 in the morning. Pt was reminded that she was up most of the night last night and got approximately one hour of sleep; she appeared surprised to learn that. Pt says that her appetite has "gotten really bad" and states "I'm eating like crazy" since she has been admitted to the hospital. She says that she was trying to lose weight because she was prediabetic and was doing well until she was admitted to the hospital. Pt denies depression and says "I have a lot of sleepiness during the day." Pt expresses feelings of anxiety, which she rates 5/10 and states "That orange pill scares the crap out of me." No acute distress noted.

## 2021-07-24 NOTE — Progress Notes (Signed)
Pt refuses to take nighttime medications, stating that her daughter told her to wait until she comes back so that this nurse can explain medications to her and her daughter before she takes them. Explained to pt that daughter has left the hospital because visiting hours have ended. Pt states that her daughter is still at the hospital because she is an administration. Informed pt that daughter is aware of her medications, as she inquired about them during visitation and that her daughter wants her to take her medications; pt insists that she needs to wait for daughter to return before she takes medications. Nighttime medications reviewed with pt and this nurse acknowledged pt's fear of the "orange pill". Pt states "I'll wait until my daughter comes back, then I'll take my medicine." Pt then returned to her room. No acute distress noted.

## 2021-07-24 NOTE — Progress Notes (Signed)
Patient is alert oriented with some confusion. Pacing up and down the unit. During assessment patient report not sleeping well. Denies pain, anxiety and depression. Denies SI, HI and AVH. Report appetite fair. Verbalized needs to go home. Report that she has a doctor's appointment she has to attend today. Patient was informed that appointment would have to be rescheduled, due to the fact patient is here and is not being discharge yet. Patient states " I'm trying to come up with a new COVID vaccine" "COVID Collie Siad". Compliant with due P.O. medications. Currently in the day room among staff and peers with q15 minute safety check.

## 2021-07-24 NOTE — BHH Suicide Risk Assessment (Signed)
[place] INPATIENT:  Family/Significant Other Suicide Prevention Education  Suicide Prevention Education:  Education Completed; Geralyn Flash, daughter (name of family member/significant other) has been identified by the patient as the family member/significant other with whom the patient will be residing, and identified as the person(s) who will aid the patient in the event of a mental health crisis (suicidal ideations/suicide attempt).  With written consent from the patient, the family member/significant other has been provided the following suicide prevention education, prior to the and/or following the discharge of the patient.  The suicide prevention education provided includes the following: Suicide risk factors Suicide prevention and interventions National Suicide Hotline telephone number [facility] assessment telephone number [facility] Emergency Assistance [place] and/or Residential Mobile Crisis Unit telephone number  Request made of family/significant other to: Remove weapons (e.g., guns, rifles, knives), all items previously/currently identified as safety concern.   Remove drugs/medications (over-the-counter, prescriptions, illicit drugs), all items previously/currently identified as a safety concern.  The family member/significant other verbalizes understanding of the suicide prevention education information provided.  The family member/significant other agrees to remove the items of safety concern listed above.  Lorece Keach A [place] 07/24/2021, 11:39 AM

## 2021-07-24 NOTE — Progress Notes (Signed)
Pt requested to go into seclusion room. When asked the reason, pt states "I have covid in my stool and I need to be isolated." Pt informed that she may isolate to her room if she feel the need, although she does not have to.

## 2021-07-24 NOTE — Progress Notes (Signed)
Pt wandering in and out of room. Pt standing at nurses' station requesting to be placed in seclusion room or "quiet room", saying that nurse and BHT were going to be arrested, demanding to be discharged, saying that a particular pt had covid before he was killed; attempts to reorient pt to reality and redirect her to her room were unsuccessful. Pt began talking loudly and had to be quieted down due to other patients sleeping. Pt offered oral medications or injectable medications for agitation. Pt initially refused all medications but agreed to injectable medication, then she refused injectable medication until security arrived. Pt returned to her room prior to security's arrival but she still would not let either nurse administer injection medications. When security arrived, pt willingly allowed both nurses to administer injection medication at same time without incident. No acute distress noted.

## 2021-07-24 NOTE — Progress Notes (Signed)
Recreation Therapy Notes  Date: 07/24/2021  Time: 1:00pm    Location: Quiet Room   Behavioral response: N/A   Intervention Topic: Teamwork  Discussion/Intervention: Patient did not attend group.  Clinical Observations/Feedback:  Patient did not attend group.   Eidan Muellner LRT/CTRS        Tajanae Guilbault 07/24/2021 3:34 PM

## 2021-07-25 DIAGNOSIS — F332 Major depressive disorder, recurrent severe without psychotic features: Secondary | ICD-10-CM | POA: Diagnosis not present

## 2021-07-25 MED ORDER — LORAZEPAM 2 MG/ML IJ SOLN
2.0000 mg | INTRAMUSCULAR | Status: DC | PRN
  Administered 2021-08-01: 2 mg via INTRAMUSCULAR
  Filled 2021-07-25: qty 1

## 2021-07-25 MED ORDER — HALOPERIDOL 5 MG PO TABS
5.0000 mg | ORAL_TABLET | ORAL | Status: DC
  Administered 2021-07-25 – 2021-07-30 (×10): 5 mg via ORAL
  Filled 2021-07-25 (×11): qty 1

## 2021-07-25 MED ORDER — HALOPERIDOL 5 MG PO TABS
5.0000 mg | ORAL_TABLET | ORAL | Status: DC | PRN
  Administered 2021-07-26 – 2021-07-30 (×2): 5 mg via ORAL
  Filled 2021-07-25 (×2): qty 1

## 2021-07-25 MED ORDER — DIPHENHYDRAMINE HCL 50 MG/ML IJ SOLN
50.0000 mg | INTRAMUSCULAR | Status: DC | PRN
  Administered 2021-08-01: 50 mg via INTRAMUSCULAR
  Filled 2021-07-25: qty 1

## 2021-07-25 MED ORDER — BENZTROPINE MESYLATE 1 MG PO TABS
1.0000 mg | ORAL_TABLET | ORAL | Status: DC
  Administered 2021-07-25 – 2021-08-06 (×24): 1 mg via ORAL
  Filled 2021-07-25 (×25): qty 1

## 2021-07-25 MED ORDER — CHLORPROMAZINE HCL 50 MG PO TABS
50.0000 mg | ORAL_TABLET | Freq: Every day | ORAL | Status: DC
  Administered 2021-07-25 – 2021-07-31 (×7): 50 mg via ORAL
  Filled 2021-07-25 (×8): qty 1

## 2021-07-25 MED ORDER — HALOPERIDOL LACTATE 5 MG/ML IJ SOLN
5.0000 mg | INTRAMUSCULAR | Status: DC | PRN
  Administered 2021-08-01: 5 mg via INTRAMUSCULAR
  Filled 2021-07-25: qty 1

## 2021-07-25 MED ORDER — DIPHENHYDRAMINE HCL 25 MG PO CAPS
50.0000 mg | ORAL_CAPSULE | ORAL | Status: DC | PRN
  Administered 2021-07-26 – 2021-07-30 (×2): 50 mg via ORAL
  Filled 2021-07-25 (×2): qty 2

## 2021-07-25 MED ORDER — LORAZEPAM 1 MG PO TABS
2.0000 mg | ORAL_TABLET | ORAL | Status: DC | PRN
  Administered 2021-07-26: 2 mg via ORAL
  Filled 2021-07-25 (×2): qty 2

## 2021-07-25 NOTE — Group Note (Signed)
[place] LCSW Group Therapy Note   Group Date: 07/25/2021 Start Time: 1400 End Time: 1450   Type of Therapy/Topic:  Group Therapy:  Emotion Regulation  Participation Level:  Did Not Attend     Description of Group:    The purpose of this group is to assist patients in learning to regulate negative emotions and experience positive emotions. Patients will be guided to discuss ways in which they have been vulnerable to their negative emotions. These vulnerabilities will be juxtaposed with experiences of positive emotions or situations, and patients challenged to use positive emotions to combat negative ones. Special emphasis will be placed on coping with negative emotions in conflict situations, and patients will process healthy conflict resolution skills.  Therapeutic Goals: Patient will identify two positive emotions or experiences to reflect on in order to balance out negative emotions:  Patient will label two or more emotions that they find the most difficult to experience:  Patient will be able to demonstrate positive conflict resolution skills through discussion or role plays:   Summary of Patient Progress:   X    Therapeutic Modalities:   Cognitive Behavioral Therapy Feelings Identification Dialectical Behavioral Therapy   Zuhayr Deeney A [place], LCSWA

## 2021-07-25 NOTE — Progress Notes (Signed)
Pt ambulating with walker and escorted to room with 2-person standby-assist. Pt states "I feel really, really, really sleepy." I think it's the pill they gave me." Approximately 5 minutes later, pt lying in bed with eyes closed, resps even and unlabored. No acute distress noted.

## 2021-07-25 NOTE — Progress Notes (Signed)
Recreation Therapy Notes  Date: 07/25/2021  Time: 1:35 pm    Location: Craft room    Behavioral response: N/A   Intervention Topic: Self-care    Discussion/Intervention: Patient did not attend group.  Clinical Observations/Feedback:  Patient did not attend group.   Gurshan Settlemire LRT/CTRS        Chaunta Bejarano 07/25/2021 4:14 PM

## 2021-07-25 NOTE — Progress Notes (Signed)
[facility] MD Progress Note  07/25/2021 10:31 AM Becky Davis  MRN:  [id number] Subjective:  Becky Davis had a rough night last night.  She has been cheeking her medications.  So, I am not sure how much medications she has been actually receiving.  She had a blowup last night and required as needed injections.  She calmed down after that.  She seems to be sundowning because her behavior is a little better in the daytime.  We did make some medication changes.  Principal Problem: MDD (major depressive disorder), recurrent severe, without psychosis ([place]) Diagnosis: Principal Problem:   MDD (major depressive disorder), recurrent severe, without psychosis ([place]) Active Problems:   Depression  Total Time spent with patient: 15 minutes  Past Psychiatric History: Sees Dr. Toy Care out-patient  Past Medical History:  Past Medical History:  Diagnosis Date   Arthritis    knees   Asthma    Headache    hx of 20 years ago   Heart murmur    hx of 10-15 years ago   Hypertension    Hypothyroidism    Pneumonia    Pre-diabetes    Thyroid nodule     Past Surgical History:  Procedure Laterality Date   ABDOMINAL HYSTERECTOMY     thyroid goiter     surgery   THYROID LOBECTOMY     06-25-17 Dr. Harlow Asa   Left   THYROID LOBECTOMY Left 06/25/2017   Procedure: LEFT THYROID LOBECTOMY;  Surgeon: Armandina Gemma, MD;  Location: [facility];  Service: General;  Laterality: Left;   TOTAL KNEE ARTHROPLASTY Right 12/20/2019   Procedure: TOTAL KNEE ARTHROPLASTY;  Surgeon: Marchia Bond, MD;  Location: [facility];  Service: Orthopedics;  Laterality: Right;   Family History:  Family History  Problem Relation Age of Onset   Emphysema Mother    Cancer Mother    Heart disease Mother    Cancer Sister    Cancer Brother     Social History:  Social History   Substance and Sexual Activity  Alcohol Use No     Social History   Substance and Sexual Activity  Drug Use No    Social History   Socioeconomic History   Marital  status: Married    Spouse name: Not on file   Number of children: 3   Years of education: Not on file   Highest education level: Not on file  Occupational History   Not on file  Tobacco Use   Smoking status: Never   Smokeless tobacco: Never  Vaping Use   Vaping Use: Never used  Substance and Sexual Activity   Alcohol use: No   Drug use: No   Sexual activity: Not Currently  Other Topics Concern   Not on file  Social History Narrative   Not on file   Social Determinants of Health   Financial Resource Strain: Not on file  Food Insecurity: Not on file  Transportation Needs: Not on file  Physical Activity: Not on file  Stress: Not on file  Social Connections: Not on file   Additional Social History:                         Sleep: Poor  Appetite:  Good  Current Medications: Current Facility-Administered Medications  Medication Dose Route Frequency Provider Last Rate Last Admin   acetaminophen (TYLENOL) tablet 650 mg  650 mg Oral Q6H PRN Parks Ranger, DO   325 mg at 07/23/21 2114  albuterol (PROVENTIL) (2.5 MG/3ML) 0.083% nebulizer solution 2.5 mg  2.5 mg Nebulization Q4H PRN Parks Ranger, DO       alum & mag hydroxide-simeth (MAALOX/MYLANTA) 200-200-20 MG/5ML suspension 30 mL  30 mL Oral Q4H PRN Parks Ranger, DO       benztropine (COGENTIN) tablet 1 mg  1 mg Oral BH-q8a4p Rajendra Spiller Edward, DO       chlorproMAZINE (THORAZINE) tablet 50 mg  50 mg Oral QHS Parks Ranger, DO       diphenhydrAMINE (BENADRYL) capsule 50 mg  50 mg Oral Q4H PRN Parks Ranger, DO       Or   diphenhydrAMINE (BENADRYL) injection 50 mg  50 mg Intramuscular Q4H PRN Parks Ranger, DO       donepezil (ARICEPT) tablet 5 mg  5 mg Oral QHS [name], [name]   5 mg at 07/23/21 2116   gabapentin (NEURONTIN) capsule 200 mg  200 mg Oral TID Wynell Balloon, [name]   200 mg at 07/24/21 1710   haloperidol (HALDOL) tablet 5 mg  5 mg Oral BH-q8a4p  Chaniya Genter Percell Miller, DO       haloperidol (HALDOL) tablet 5 mg  5 mg Oral Q4H PRN Parks Ranger, DO       Or   haloperidol lactate (HALDOL) injection 5 mg  5 mg Intramuscular Q4H PRN Parks Ranger, DO       levothyroxine (SYNTHROID) tablet 100 mcg  100 mcg Oral QAC breakfast Parks Ranger, DO   100 mcg at 07/25/21 0800   loratadine (CLARITIN) tablet 10 mg  10 mg Oral Daily Parks Ranger, DO   10 mg at 07/24/21 0948   LORazepam (ATIVAN) 2 MG/ML injection            LORazepam (ATIVAN) tablet 2 mg  2 mg Oral Q4H PRN Parks Ranger, DO       Or   LORazepam (ATIVAN) injection 2 mg  2 mg Intramuscular Q4H PRN Parks Ranger, DO       magnesium hydroxide (MILK OF MAGNESIA) suspension 30 mL  30 mL Oral Daily PRN Parks Ranger, DO       mirtazapine (REMERON) tablet 15 mg  15 mg Oral QHS Ellysa Parrack Edward, DO       montelukast (SINGULAIR) tablet 10 mg  10 mg Oral QHS Parks Ranger, DO   10 mg at 07/23/21 2116    Lab Results: No results found for this or any previous visit (from the past 48 hour(s)).  Blood Alcohol level:  Lab Results  Component Value Date   ETH <10 71/24/5809    Metabolic Disorder Labs: Lab Results  Component Value Date   HGBA1C 5.4 07/19/2021   MPG 108.28 07/19/2021   MPG 119.76 12/13/2019   No results found for: PROLACTIN Lab Results  Component Value Date   CHOL 178 07/19/2021   TRIG 85 07/19/2021   HDL 49 07/19/2021   CHOLHDL 3.6 07/19/2021   VLDL 17 07/19/2021   LDLCALC 112 (H) 07/19/2021    Physical Findings: AIMS:  , ,  ,  ,    CIWA:    COWS:     Musculoskeletal: Strength & Muscle Tone: within normal limits Gait & Station: normal Patient leans: N/A  Psychiatric Specialty Exam:  Presentation  General Appearance: Casual; Disheveled  Eye Contact:Fair  Speech:Pressured  Speech Volume:Increased  Handedness:No data recorded  Mood and Affect   Mood:Anxious  Affect:Appropriate   Thought Process  Thought Processes:Disorganized  Descriptions of Associations:Loose  Orientation:Partial  Thought Content:Perseveration; Scattered  History of Schizophrenia/Schizoaffective disorder:No  Duration of Psychotic Symptoms:Greater than six months  Hallucinations:No data recorded Ideas of Reference:Delusions  Suicidal Thoughts:No data recorded Homicidal Thoughts:No data recorded  Sensorium  Memory:Immediate Poor; Remote Poor  Judgment:Fair  Insight:Fair   Executive Functions  Concentration:Fair  Attention Span:Fair  [place]   Psychomotor Activity  Psychomotor Activity:No data recorded  Assets  Assets:Communication Skills; Desire for Improvement; Financial Resources/Insurance; Housing; Social Support   Sleep  Sleep:No data recorded   Physical Exam: Physical Exam ROS Blood pressure (!) 160/89, pulse 84, temperature 98 F (36.7 C), temperature source Oral, resp. rate 20, height 5\' 3"  (1.6 m), weight 87.5 kg, SpO2 98 %. Body mass index is 34.17 kg/m.   Treatment Plan Summary: Daily contact with patient to assess and evaluate symptoms and progress in treatment, Medication management, and Plan change as needed Haldol to 5 mg every 4 hours p.o. or IM.  Change scheduled Haldol to 5 mg twice a day and schedule Cogentin 1 mg twice a day.  Continue Remeron 15 mg at bedtime and add Thorazine 50 mg at bedtime.  Increase as needed Benadryl to 50 mg every 4 hours as needed for agitation.  Continue Ativan 2 mg p.o. or IM as needed.  Parks Ranger, DO 07/25/2021, 10:31 AM

## 2021-07-25 NOTE — Progress Notes (Signed)
After standing in the hallway between another pt's room and the nurses' station for approximately 30 minutes, pt screamed as loudly as she could; she did not provide an explanation for her behavior. Pt refused to return to her room when asked. Approximately 2 minutes later, pt screamed as loudly as she could again. Psych NP informed of pt's behavior, in addition to her refusal to take her medications; IM medications requested and ordered. Security called to unit for assistance. Pt refused to go to room when asked by security. Pt physically escorted to room by security and medications administered with assistance by security.

## 2021-07-25 NOTE — Plan of Care (Signed)
Patient presents this am lethargic sitting in day room with eyes closed.  Patient appearance is unremarkable except poor hygiene and body odor noted.  Patient is oriented to person, place and time but disoriented to situation.  Affect is pleasant, calm and cooperative.  Thoughts are delusional and and paranoid.  Patient refused Haldol stating , "They use it to castrate males."  Encouraged her to call daughter who would want her to take prescribed meds.  After calling dgtr pt states, "She told me I need to take it."  Patient then compliant and took Haldol without hesitation.  Offered shower - pt refused.   Will continue Q 15 minute safety rounds per unit policy.  Problem: Education: Goal: Emotional status will improve Outcome: Progressing Goal: Mental status will improve Outcome: Progressing Goal: Verbalization of understanding the information provided will improve Outcome: Progressing   Problem: Activity: Goal: Sleeping patterns will improve Outcome: Progressing

## 2021-07-26 DIAGNOSIS — F332 Major depressive disorder, recurrent severe without psychotic features: Secondary | ICD-10-CM | POA: Diagnosis not present

## 2021-07-26 NOTE — Progress Notes (Signed)
[facility] MD Progress Note  07/26/2021 11:08 AM Becky Davis  MRN:  [id number] Subjective: Patient presented originally from [facility] in [place] on an involuntary commitment, which she remains.  She refused to sign in.  She had a better night last night and actually did get some sleep.  She has been cheeking her medications but so far the last 24 hours there is no evidence of that.  I did change her medications and increase them yesterday and she seems to be doing better.  She asked about going home and I told her she was not ready.  Since we made med changes yesterday we will continue to monitor her.  She denies any side effects and there is no evidence of EPS or TD. Principal Problem: New On-set Dementia. 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 Toy Care out-patient  Past Medical History:  Past Medical History:  Diagnosis Date   Arthritis    knees   Asthma    Headache    hx of 20 years ago   Heart murmur    hx of 10-15 years ago   Hypertension    Hypothyroidism    Pneumonia    Pre-diabetes    Thyroid nodule     Past Surgical History:  Procedure Laterality Date   ABDOMINAL HYSTERECTOMY     thyroid goiter     surgery   THYROID LOBECTOMY     06-25-17 Dr. Harlow Asa   Left   THYROID LOBECTOMY Left 06/25/2017   Procedure: LEFT THYROID LOBECTOMY;  Surgeon: Armandina Gemma, MD;  Location: [facility];  Service: General;  Laterality: Left;   TOTAL KNEE ARTHROPLASTY Right 12/20/2019   Procedure: TOTAL KNEE ARTHROPLASTY;  Surgeon: Marchia Bond, MD;  Location: [facility];  Service: Orthopedics;  Laterality: Right;   Family History:  Family History  Problem Relation Age of Onset   Emphysema Mother    Cancer Mother    Heart disease Mother    Cancer Sister    Cancer Brother    Family Psychiatric  History: Unknown Social History:  Social History   Substance and  Sexual Activity  Alcohol Use No     Social History   Substance and Sexual Activity  Drug Use No    Social History   Socioeconomic History   Marital status: Married    Spouse name: Not on file   Number of children: 3   Years of education: Not on file   Highest education level: Not on file  Occupational History   Not on file  Tobacco Use   Smoking status: Never   Smokeless tobacco: Never  Vaping Use   Vaping Use: Never used  Substance and Sexual Activity   Alcohol use: No   Drug use: No   Sexual activity: Not Currently  Other Topics Concern   Not on file  Social History Narrative   Not on file   Social Determinants of Health   Financial Resource Strain: Not on file  Food Insecurity: Not on file  Transportation Needs: Not on file  Physical Activity: Not on file  Stress: Not on file  Social Connections: Not on file   Additional Social History:                         Sleep: Fair  Appetite:  Fair  Current Medications: Current Facility-Administered Medications  Medication Dose Route Frequency Provider Last Rate Last Admin   acetaminophen (TYLENOL) tablet 650 mg  650 mg Oral Q6H PRN Parks Ranger, DO   325 mg at 07/23/21 2114   albuterol (PROVENTIL) (2.5 MG/3ML) 0.083% nebulizer solution 2.5 mg  2.5 mg Nebulization Q4H PRN Parks Ranger, DO       alum & mag hydroxide-simeth (MAALOX/MYLANTA) 200-200-20 MG/5ML suspension 30 mL  30 mL Oral Q4H PRN Parks Ranger, DO       benztropine (COGENTIN) tablet 1 mg  1 mg Oral BH-q8a4p Parks Ranger, DO   1 mg at 07/26/21 0805   chlorproMAZINE (THORAZINE) tablet 50 mg  50 mg Oral QHS Parks Ranger, DO   50 mg at 07/25/21 2355   diphenhydrAMINE (BENADRYL) capsule 50 mg  50 mg Oral Q4H PRN Parks Ranger, DO       Or   diphenhydrAMINE (BENADRYL) injection 50 mg  50 mg Intramuscular Q4H PRN Parks Ranger, DO       donepezil (ARICEPT) tablet 5 mg  5 mg  Oral QHS [name], [name]   5 mg at 07/25/21 2357   gabapentin (NEURONTIN) capsule 200 mg  200 mg Oral TID Wynell Balloon, [name]   200 mg at 07/26/21 0958   haloperidol (HALDOL) tablet 5 mg  5 mg Oral BH-q8a4p Parks Ranger, DO   5 mg at 07/26/21 1324   haloperidol (HALDOL) tablet 5 mg  5 mg Oral Q4H PRN Parks Ranger, DO       Or   haloperidol lactate (HALDOL) injection 5 mg  5 mg Intramuscular Q4H PRN Parks Ranger, DO       levothyroxine (SYNTHROID) tablet 100 mcg  100 mcg Oral QAC breakfast Parks Ranger, DO   100 mcg at 07/26/21 0545   loratadine (CLARITIN) tablet 10 mg  10 mg Oral Daily Parks Ranger, DO   10 mg at 07/26/21 4010   LORazepam (ATIVAN) tablet 2 mg  2 mg Oral Q4H PRN Parks Ranger, DO       Or   LORazepam (ATIVAN) injection 2 mg  2 mg Intramuscular Q4H PRN Parks Ranger, DO       magnesium hydroxide (MILK OF MAGNESIA) suspension 30 mL  30 mL Oral Daily PRN Parks Ranger, DO       mirtazapine (REMERON) tablet 15 mg  15 mg Oral QHS Parks Ranger, DO   15 mg at 07/25/21 2358   montelukast (SINGULAIR) tablet 10 mg  10 mg Oral QHS Parks Ranger, DO   10 mg at 07/25/21 2355    Lab Results: No results found for this or any previous visit (from the past 48 hour(s)).  Blood Alcohol level:  Lab Results  Component Value Date   ETH <10 27/25/3664    Metabolic Disorder Labs: Lab Results  Component Value Date   HGBA1C 5.4 07/19/2021   MPG 108.28 07/19/2021   MPG 119.76 12/13/2019   No results found for: PROLACTIN Lab Results  Component Value Date   CHOL 178 07/19/2021   TRIG 85 07/19/2021   HDL 49 07/19/2021   CHOLHDL 3.6 07/19/2021   VLDL 17 07/19/2021   LDLCALC 112 (H) 07/19/2021    Physical Findings: AIMS:  , ,  ,  ,    CIWA:    COWS:     Musculoskeletal: Strength & Muscle Tone: within normal limits Gait & Station: normal Patient leans: N/A  Psychiatric Specialty  Exam:  Presentation  General Appearance: Casual; Disheveled  Eye Contact:Fair  Speech:Pressured  Speech Volume:Increased  Handedness:No data recorded  Mood and Affect  Mood:Anxious  Affect:Appropriate   Thought Process  Thought Processes:Disorganized  Descriptions of Associations:Loose  Orientation:Partial  Thought Content:Perseveration; Scattered  History of Schizophrenia/Schizoaffective disorder:No  Duration of Psychotic Symptoms:Greater than six months  Hallucinations:No data recorded Ideas of Reference:Delusions  Suicidal Thoughts:No data recorded Homicidal Thoughts:No data recorded  Sensorium  Memory:Immediate Poor; Remote Poor  Judgment:Fair  Insight:Fair   Executive Functions  Concentration:Fair  Attention Span:Fair  [place]   Psychomotor Activity  Psychomotor Activity:No data recorded  Assets  Assets:Communication Skills; Desire for Improvement; Financial Resources/Insurance; Housing; Social Support   Sleep  Sleep:No data recorded   Physical Exam: Physical Exam Vitals and nursing note reviewed.  Constitutional:      Appearance: Normal appearance. She is normal weight.  Neurological:     General: No focal deficit present.     Mental Status: She is alert and oriented to person, place, and time.  Psychiatric:        Attention and Perception: Attention normal. She perceives visual hallucinations.        Mood and Affect: Mood is anxious. Affect is inappropriate.        Speech: Speech normal.        Behavior: Behavior is agitated.        Thought Content: Thought content is paranoid and delusional.        Cognition and Memory: Cognition is impaired. Memory is impaired.        Judgment: Judgment is impulsive and inappropriate.   Review of Systems  Constitutional: Negative.   HENT: Negative.    Eyes: Negative.   Respiratory: Negative.    Cardiovascular: Negative.   Gastrointestinal:  Negative.   Genitourinary: Negative.   Musculoskeletal: Negative.   Skin: Negative.   Neurological: Negative.   Endo/Heme/Allergies: Negative.   Psychiatric/Behavioral:  Positive for hallucinations.    Blood pressure 138/80, pulse 66, temperature (!) 97.4 F (36.3 C), temperature source Oral, resp. rate 18, height 5\' 3"  (1.6 m), weight 87.5 kg, SpO2 96 %. Body mass index is 34.17 kg/m.   Treatment Plan Summary: Daily contact with patient to assess and evaluate symptoms and progress in treatment, Medication management, and Plan continue current medications.  [place], DO 07/26/2021, 11:08 AM

## 2021-07-26 NOTE — Progress Notes (Signed)
Patient is alert and oriented to person, place and time. Observed ambulating down the hall with front wheel walker. Denies pain, anxiety or depression during assessment. Denies SI, HI and AVH. Ate breakfast in the day room among peers. Compliant with morning medications. Continue to pace around the unit with and without walker. Continue to respond to internal stimuli. Remain safe on the unit with q15 minute safety checks.

## 2021-07-26 NOTE — Progress Notes (Signed)
Pt sitting on edge of bed; calm, cooperative. Pt states "I'm feeling fine." Pt denies pain and SI/HI/AVH at this time. Pt states "I've slept around the clock with 2 bathroom visits" when asked about how she has been sleeping. She describes her appetite as "fair; good". Pt denies depression at this time; however, she expresses "a little" anxiety, which she rates 1/10. No acute distress noted.

## 2021-07-26 NOTE — Progress Notes (Signed)
Patient is disoriented to situation. Pt is medication compliant but hesitant to take medications. Patient denies SI, HI, and AVH at this time. Pt is seen pacing hallways and making demands to staff. Pt remains safe on the unit at his time.

## 2021-07-27 DIAGNOSIS — F332 Major depressive disorder, recurrent severe without psychotic features: Secondary | ICD-10-CM | POA: Diagnosis not present

## 2021-07-27 NOTE — Progress Notes (Signed)
Patient observed sitting in the day room; calm and cooperative. During assessment patient denies pain or discomfort, denies depression or anxiety. Denies SI, HI. Reports talking to computers, patient stated " I actually formulate in my head, I heard somebody say I'M an angel". Continue to respond to internal stimuli. Patient report sleeping too long, stated " I slept about 10 hours". Compliant with morning medications. Ate breakfast in the day room among staff and peers with good appetite. Remain restless, continue to wander around the unit. Patient remain safe on the unit with q15 minute safety check.

## 2021-07-27 NOTE — Progress Notes (Signed)
[facility] MD Progress Note  07/27/2021 12:35 PM Becky Davis  MRN:  [id number] Subjective: Follow-up for this 71 year old woman in the hospital with what sounds like recent onset of psychotic symptoms.  Patient is somewhat agitated and spends most of her time on her feet pacing around the unit.  Not aggressive or hostile however.  She was happy to talk and rambled at length about how she used to be a subject of paranormal investigations and concurrently talked to computers.  Showed me multiple pieces of paper that she had in her folder.  Nothing about her threatening or aggressive.  No suicidal ideation.  Notes indicate that she had been noncompliant with medicine but currently taking her meds.  She says that she would like to stop taking the Haldol but it does not appear she is on that other than for as needed.  Little confused about other medicines.  Vital signs stable no new labs Principal Problem: MDD (major depressive disorder), recurrent severe, without psychosis ([place]) Diagnosis: Principal Problem:   MDD (major depressive disorder), recurrent severe, without psychosis ([place]) Active Problems:   Depression  Total Time spent with patient: 30 minutes  Past Psychiatric History: Past history of depression chronic outpatient treatment recent dementia  Past Medical History:  Past Medical History:  Diagnosis Date   Arthritis    knees   Asthma    Headache    hx of 20 years ago   Heart murmur    hx of 10-15 years ago   Hypertension    Hypothyroidism    Pneumonia    Pre-diabetes    Thyroid nodule     Past Surgical History:  Procedure Laterality Date   ABDOMINAL HYSTERECTOMY     thyroid goiter     surgery   THYROID LOBECTOMY     06-25-17 Dr. Harlow Asa   Left   THYROID LOBECTOMY Left 06/25/2017   Procedure: LEFT THYROID LOBECTOMY;  Surgeon: Armandina Gemma, MD;  Location: [facility];  Service: General;  Laterality: Left;   TOTAL KNEE ARTHROPLASTY Right 12/20/2019   Procedure: TOTAL KNEE ARTHROPLASTY;   Surgeon: Marchia Bond, MD;  Location: [facility];  Service: Orthopedics;  Laterality: Right;   Family History:  Family History  Problem Relation Age of Onset   Emphysema Mother    Cancer Mother    Heart disease Mother    Cancer Sister    Cancer Brother    Family Psychiatric  History: See previous Social History:  Social History   Substance and Sexual Activity  Alcohol Use No     Social History   Substance and Sexual Activity  Drug Use No    Social History   Socioeconomic History   Marital status: Married    Spouse name: Not on file   Number of children: 3   Years of education: Not on file   Highest education level: Not on file  Occupational History   Not on file  Tobacco Use   Smoking status: Never   Smokeless tobacco: Never  Vaping Use   Vaping Use: Never used  Substance and Sexual Activity   Alcohol use: No   Drug use: No   Sexual activity: Not Currently  Other Topics Concern   Not on file  Social History Narrative   Not on file   Social Determinants of Health   Financial Resource Strain: Not on file  Food Insecurity: Not on file  Transportation Needs: Not on file  Physical Activity: Not on file  Stress: Not on  file  Social Connections: Not on file   Additional Social History:                         Sleep: Fair  Appetite:  Fair  Current Medications: Current Facility-Administered Medications  Medication Dose Route Frequency Provider Last Rate Last Admin   acetaminophen (TYLENOL) tablet 650 mg  650 mg Oral Q6H PRN Parks Ranger, DO   325 mg at 07/23/21 2114   albuterol (PROVENTIL) (2.5 MG/3ML) 0.083% nebulizer solution 2.5 mg  2.5 mg Nebulization Q4H PRN Parks Ranger, DO       alum & mag hydroxide-simeth (MAALOX/MYLANTA) 200-200-20 MG/5ML suspension 30 mL  30 mL Oral Q4H PRN Parks Ranger, DO       benztropine (COGENTIN) tablet 1 mg  1 mg Oral BH-q8a4p Parks Ranger, DO   1 mg at 07/27/21 4098    chlorproMAZINE (THORAZINE) tablet 50 mg  50 mg Oral QHS Parks Ranger, DO   50 mg at 07/26/21 2117   diphenhydrAMINE (BENADRYL) capsule 50 mg  50 mg Oral Q4H PRN Parks Ranger, DO   50 mg at 07/26/21 2117   Or   diphenhydrAMINE (BENADRYL) injection 50 mg  50 mg Intramuscular Q4H PRN Parks Ranger, DO       donepezil (ARICEPT) tablet 5 mg  5 mg Oral QHS [name], [name]   5 mg at 07/26/21 2117   gabapentin (NEURONTIN) capsule 200 mg  200 mg Oral TID Wynell Balloon, [name]   200 mg at 07/27/21 1010   haloperidol (HALDOL) tablet 5 mg  5 mg Oral BH-q8a4p Parks Ranger, DO   5 mg at 07/27/21 1191   haloperidol (HALDOL) tablet 5 mg  5 mg Oral Q4H PRN Parks Ranger, DO   5 mg at 07/26/21 2117   Or   haloperidol lactate (HALDOL) injection 5 mg  5 mg Intramuscular Q4H PRN Parks Ranger, DO       levothyroxine (SYNTHROID) tablet 100 mcg  100 mcg Oral QAC breakfast Parks Ranger, DO   100 mcg at 07/27/21 0544   loratadine (CLARITIN) tablet 10 mg  10 mg Oral Daily Parks Ranger, DO   10 mg at 07/27/21 1010   LORazepam (ATIVAN) tablet 2 mg  2 mg Oral Q4H PRN Parks Ranger, DO   2 mg at 07/26/21 2221   Or   LORazepam (ATIVAN) injection 2 mg  2 mg Intramuscular Q4H PRN Parks Ranger, DO       magnesium hydroxide (MILK OF MAGNESIA) suspension 30 mL  30 mL Oral Daily PRN Parks Ranger, DO       mirtazapine (REMERON) tablet 15 mg  15 mg Oral QHS Parks Ranger, DO   15 mg at 07/26/21 2117   montelukast (SINGULAIR) tablet 10 mg  10 mg Oral QHS Parks Ranger, DO   10 mg at 07/26/21 2117    Lab Results: No results found for this or any previous visit (from the past 48 hour(s)).  Blood Alcohol level:  Lab Results  Component Value Date   ETH <10 47/82/9562    Metabolic Disorder Labs: Lab Results  Component Value Date   HGBA1C 5.4 07/19/2021   MPG 108.28 07/19/2021   MPG 119.76 12/13/2019    No results found for: PROLACTIN Lab Results  Component Value Date   CHOL 178 07/19/2021   TRIG 85 07/19/2021   HDL 49 07/19/2021  CHOLHDL 3.6 07/19/2021   VLDL 17 07/19/2021   LDLCALC 112 (H) 07/19/2021    Physical Findings: AIMS:  , ,  ,  ,    CIWA:    COWS:     Musculoskeletal: Strength & Muscle Tone: within normal limits Gait & Station: normal Patient leans: N/A  Psychiatric Specialty Exam:  Presentation  General Appearance: Casual; Disheveled  Eye Contact:Fair  Speech:Pressured  Speech Volume:Increased  Handedness:No data recorded  Mood and Affect  Mood:Anxious  Affect:Appropriate   Thought Process  Thought Processes:Disorganized  Descriptions of Associations:Loose  Orientation:Partial  Thought Content:Perseveration; Scattered  History of Schizophrenia/Schizoaffective disorder:No  Duration of Psychotic Symptoms:Greater than six months  Hallucinations:No data recorded Ideas of Reference:Delusions  Suicidal Thoughts:No data recorded Homicidal Thoughts:No data recorded  Sensorium  Memory:Immediate Poor; Remote Poor  Judgment:Fair  Insight:Fair   Executive Functions  Concentration:Fair  Attention Span:Fair  [place]   Psychomotor Activity  Psychomotor Activity:No data recorded  Assets  Assets:Communication Skills; Desire for Improvement; Financial Resources/Insurance; Housing; Social Support   Sleep  Sleep:No data recorded   Physical Exam: Physical Exam Vitals and nursing note reviewed.  Constitutional:      Appearance: Normal appearance.  HENT:     Head: Normocephalic and atraumatic.     Mouth/Throat:     Pharynx: Oropharynx is clear.  Eyes:     Pupils: Pupils are equal, round, and reactive to light.  Cardiovascular:     Rate and Rhythm: Normal rate and regular rhythm.  Pulmonary:     Effort: Pulmonary effort is normal.     Breath sounds: Normal breath sounds.   Abdominal:     General: Abdomen is flat.     Palpations: Abdomen is soft.  Musculoskeletal:        General: Normal range of motion.  Skin:    General: Skin is warm and dry.  Neurological:     General: No focal deficit present.     Mental Status: She is alert. Mental status is at baseline.  Psychiatric:        Attention and Perception: She is inattentive.        Mood and Affect: Mood normal.        Speech: Speech is tangential.        Behavior: Behavior is agitated. Behavior is not aggressive.        Thought Content: Thought content is delusional. Thought content does not include homicidal or suicidal ideation.        Cognition and Memory: Cognition is impaired. Memory is impaired.   Review of Systems  Constitutional: Negative.   HENT: Negative.    Eyes: Negative.   Respiratory: Negative.    Cardiovascular: Negative.   Gastrointestinal: Negative.   Musculoskeletal: Negative.   Skin: Negative.   Neurological: Negative.   Psychiatric/Behavioral: Negative.    Blood pressure 138/80, pulse 66, temperature (!) 97.4 F (36.3 C), temperature source Oral, resp. rate 18, height 5\' 3"  (1.6 m), weight 87.5 kg, SpO2 96 %. Body mass index is 34.17 kg/m.   Treatment Plan Summary: Medication management and Plan no indication for any change to medication.  Review labs and vitals.  Supportive counseling and therapy with the patient.  Continue current treatment plan.  Alethia Berthold, MD 07/27/2021, 12:35 PM

## 2021-07-27 NOTE — Group Note (Signed)
LCSW Group Therapy Note  Group Date: 07/27/2021 Start Time: 1300 End Time: 1400   Type of Therapy and Topic:  Group Therapy - Healthy vs Unhealthy Coping Skills  Participation Level:  Active   Description of Group The focus of this group was to determine what unhealthy coping techniques typically are used by group members and what healthy coping techniques would be helpful in coping with various problems. Patients were guided in becoming aware of the differences between healthy and unhealthy coping techniques. Patients were asked to identify 2-3 healthy coping skills they would like to learn to use more effectively.  Therapeutic Goals Patients learned that coping is what human beings do all day long to deal with various situations in their lives Patients defined and discussed healthy vs unhealthy coping techniques Patients identified their preferred coping techniques and identified whether these were healthy or unhealthy Patients determined 2-3 healthy coping skills they would like to become more familiar with and use more often. Patients provided support and ideas to each other   Summary of Patient Progress:   Patient was present at the beginning of group, however left stating that she heard something.  Patient did not return.  Patient appeared to be preoccupied with politics and art, bringing the conversation to the those two topics with every question presented. Patient was tangential and made loose associations, for example, pt reported that when she thinks of coping strategies she thinks "[employer]".  When asked to elaborate further patient stated "it's how to keep peers from pressuring you from becoming a Trumper.  Biden lost the good son and got to keep the bad one".  CSW again asked for clarification and patient was able to state that coping strategies is not being swayed to align self with others but believing your own beliefs.     Therapeutic Modalities Cognitive Behavioral  Therapy Motivational Interviewing  Rozann Lesches, [place] 07/27/2021  2:03 PM

## 2021-07-27 NOTE — Progress Notes (Signed)
Patient is restless and is observed pacing the hallways. Pt denies SI, HI, and AVH at this time. Pt tries to pocket medications but will take them. Pt remains safe on the unit at this time.

## 2021-07-28 DIAGNOSIS — F332 Major depressive disorder, recurrent severe without psychotic features: Secondary | ICD-10-CM | POA: Diagnosis not present

## 2021-07-28 MED ORDER — ALBUTEROL SULFATE HFA 108 (90 BASE) MCG/ACT IN AERS
2.0000 | INHALATION_SPRAY | Freq: Four times a day (QID) | RESPIRATORY_TRACT | Status: DC | PRN
  Administered 2021-07-28 – 2021-08-06 (×18): 2 via RESPIRATORY_TRACT
  Filled 2021-07-28: qty 6.7

## 2021-07-28 NOTE — Progress Notes (Signed)
Patient is seen in the dayroom and pacing the halls. Pt appears preoccupied and is seen talking to self while in the hallway. Pt denies SI, HI, and AVH at this time. Pt remains safe on the unit at this time.

## 2021-07-28 NOTE — Progress Notes (Signed)
Patient observed pacing up and down the hall with front wheel walker. Patient is restless, constantly on her feet. During assessment patient denies anxiety but verbalized frustration of being here. Denies depression, pain or discomfort. Denies SI, HI. Patient states " I'm seeing computers and wiring and hearing fine static". Patient continue to respond to internal stimuli. Attempted to pocket medication but take medication after writer intervene. Ate breakfast in the day room among peers. Remain safe on the unit with Q15 minute safety check.

## 2021-07-28 NOTE — Group Note (Signed)
LCSW Group Therapy Note  Group Date: 07/28/2021 Start Time: 0102 End Time: 1400   Type of Therapy and Topic:  Group Therapy - How To Cope with Nervousness about Discharge   Participation Level:  Active   Description of Group This process group involved identification of patients' feelings about discharge. Some of them are scheduled to be discharged soon, while others are new admissions, but each of them was asked to share thoughts and feelings surrounding discharge from the hospital. One common theme was that they are excited at the prospect of going home, while another was that many of them are apprehensive about sharing why they were hospitalized. Patients were given the opportunity to discuss these feelings with their peers in preparation for discharge.  Therapeutic Goals  Patient will identify their overall feelings about pending discharge. Patient will think about how they might proactively address issues that they believe will once again arise once they get home (i.e. with parents). Patients will participate in discussion about having hope for change.   Summary of Patient Progress: Patient was present for the entirety of the group session. Patient was an active listener and participated in the topic of discussion. Patient shared that she is "ecstatic" about the idea of going home. Patient shared that her husband is semi-retired and is able to be at home with her part-time. In addition to her husband, patient shared that she receives support from her daughter who visits her during her lunch breaks. Patient expressed motivation to continue to take her psychiatric medication after she returns home, stating that she stopped taking her medication once she tested positive for Covid because she was afraid of potential interactions which caused her to start hearing things. Patient also plans to get her hearing checked when she returns home.    Therapeutic Modalities Cognitive Behavioral  Therapy   Berniece Salines, Latanya Presser 07/28/2021  2:12 PM

## 2021-07-28 NOTE — Progress Notes (Signed)
[facility] MD Progress Note  07/28/2021 2:53 PM Becky Davis  MRN:  [id number] Subjective: Follow-up patient with dementia and intermittent psychotic symptoms.  Patient came up to me today and immediately asked if she could be discharged today.  I reminded her that that was not any part of the treatment plan and nothing that had been discussed previously and that we would not be looking into discharge today.  Asked her how she was doing and she said she thought she was doing okay.  Seems to still be agitated at times and a little disorganized.  Does however seem to be cooperating a little better with therapy although nursing says that she cheeks her medicines from time to time and they have to keep a close eye on it. Principal Problem: MDD (major depressive disorder), recurrent severe, without psychosis ([place]) Diagnosis: Principal Problem:   MDD (major depressive disorder), recurrent severe, without psychosis ([place]) Active Problems:   Depression  Total Time spent with patient: 30 minutes  Past Psychiatric History: Unclear past history.  Past Medical History:  Past Medical History:  Diagnosis Date   Arthritis    knees   Asthma    Headache    hx of 20 years ago   Heart murmur    hx of 10-15 years ago   Hypertension    Hypothyroidism    Pneumonia    Pre-diabetes    Thyroid nodule     Past Surgical History:  Procedure Laterality Date   ABDOMINAL HYSTERECTOMY     thyroid goiter     surgery   THYROID LOBECTOMY     06-25-17 Dr. Harlow Asa   Left   THYROID LOBECTOMY Left 06/25/2017   Procedure: LEFT THYROID LOBECTOMY;  Surgeon: Armandina Gemma, MD;  Location: [facility];  Service: General;  Laterality: Left;   TOTAL KNEE ARTHROPLASTY Right 12/20/2019   Procedure: TOTAL KNEE ARTHROPLASTY;  Surgeon: Marchia Bond, MD;  Location: [facility];  Service: Orthopedics;  Laterality: Right;   Family History:  Family History  Problem Relation Age of Onset   Emphysema Mother    Cancer Mother    Heart disease  Mother    Cancer Sister    Cancer Brother    Family Psychiatric  History: See previous Social History:  Social History   Substance and Sexual Activity  Alcohol Use No     Social History   Substance and Sexual Activity  Drug Use No    Social History   Socioeconomic History   Marital status: Married    Spouse name: Not on file   Number of children: 3   Years of education: Not on file   Highest education level: Not on file  Occupational History   Not on file  Tobacco Use   Smoking status: Never   Smokeless tobacco: Never  Vaping Use   Vaping Use: Never used  Substance and Sexual Activity   Alcohol use: No   Drug use: No   Sexual activity: Not Currently  Other Topics Concern   Not on file  Social History Narrative   Not on file   Social Determinants of Health   Financial Resource Strain: Not on file  Food Insecurity: Not on file  Transportation Needs: Not on file  Physical Activity: Not on file  Stress: Not on file  Social Connections: Not on file   Additional Social History:  Sleep: Fair  Appetite:  Fair  Current Medications: Current Facility-Administered Medications  Medication Dose Route Frequency Provider Last Rate Last Admin   acetaminophen (TYLENOL) tablet 650 mg  650 mg Oral Q6H PRN Parks Ranger, DO   650 mg at 07/28/21 1411   albuterol (PROVENTIL) (2.5 MG/3ML) 0.083% nebulizer solution 2.5 mg  2.5 mg Nebulization Q4H PRN Parks Ranger, DO       alum & mag hydroxide-simeth (MAALOX/MYLANTA) 200-200-20 MG/5ML suspension 30 mL  30 mL Oral Q4H PRN Parks Ranger, DO       benztropine (COGENTIN) tablet 1 mg  1 mg Oral BH-q8a4p Parks Ranger, DO   1 mg at 07/28/21 0757   chlorproMAZINE (THORAZINE) tablet 50 mg  50 mg Oral QHS Parks Ranger, DO   50 mg at 07/27/21 2106   diphenhydrAMINE (BENADRYL) capsule 50 mg  50 mg Oral Q4H PRN Parks Ranger, DO   50 mg at  07/26/21 2117   Or   diphenhydrAMINE (BENADRYL) injection 50 mg  50 mg Intramuscular Q4H PRN Parks Ranger, DO       donepezil (ARICEPT) tablet 5 mg  5 mg Oral QHS [name], [name]   5 mg at 07/27/21 2106   gabapentin (NEURONTIN) capsule 200 mg  200 mg Oral TID Wynell Balloon, [name]   200 mg at 07/28/21 1001   haloperidol (HALDOL) tablet 5 mg  5 mg Oral BH-q8a4p Parks Ranger, DO   5 mg at 07/28/21 0756   haloperidol (HALDOL) tablet 5 mg  5 mg Oral Q4H PRN Parks Ranger, DO   5 mg at 07/26/21 2117   Or   haloperidol lactate (HALDOL) injection 5 mg  5 mg Intramuscular Q4H PRN Parks Ranger, DO       levothyroxine (SYNTHROID) tablet 100 mcg  100 mcg Oral QAC breakfast Parks Ranger, DO   100 mcg at 07/28/21 9983   loratadine (CLARITIN) tablet 10 mg  10 mg Oral Daily Parks Ranger, DO   10 mg at 07/28/21 1001   LORazepam (ATIVAN) tablet 2 mg  2 mg Oral Q4H PRN Parks Ranger, DO   2 mg at 07/26/21 2221   Or   LORazepam (ATIVAN) injection 2 mg  2 mg Intramuscular Q4H PRN Parks Ranger, DO       magnesium hydroxide (MILK OF MAGNESIA) suspension 30 mL  30 mL Oral Daily PRN Parks Ranger, DO       mirtazapine (REMERON) tablet 15 mg  15 mg Oral QHS Parks Ranger, DO   15 mg at 07/27/21 2106   montelukast (SINGULAIR) tablet 10 mg  10 mg Oral QHS Parks Ranger, DO   10 mg at 07/27/21 2106    Lab Results: No results found for this or any previous visit (from the past 48 hour(s)).  Blood Alcohol level:  Lab Results  Component Value Date   ETH <10 38/25/0539    Metabolic Disorder Labs: Lab Results  Component Value Date   HGBA1C 5.4 07/19/2021   MPG 108.28 07/19/2021   MPG 119.76 12/13/2019   No results found for: PROLACTIN Lab Results  Component Value Date   CHOL 178 07/19/2021   TRIG 85 07/19/2021   HDL 49 07/19/2021   CHOLHDL 3.6 07/19/2021   VLDL 17 07/19/2021   LDLCALC 112 (H) 07/19/2021     Physical Findings: AIMS:  , ,  ,  ,    CIWA:    COWS:  Musculoskeletal: Strength & Muscle Tone: within normal limits Gait & Station: unsteady Patient leans: N/A  Psychiatric Specialty Exam:  Presentation  General Appearance: Casual; Disheveled  Eye Contact:Fair  Speech:Pressured  Speech Volume:Increased  Handedness:No data recorded  Mood and Affect  Mood:Anxious  Affect:Appropriate   Thought Process  Thought Processes:Disorganized  Descriptions of Associations:Loose  Orientation:Partial  Thought Content:Perseveration; Scattered  History of Schizophrenia/Schizoaffective disorder:No  Duration of Psychotic Symptoms:Greater than six months  Hallucinations:No data recorded Ideas of Reference:Delusions  Suicidal Thoughts:No data recorded Homicidal Thoughts:No data recorded  Sensorium  Memory:Immediate Poor; Remote Poor  Judgment:Fair  Insight:Fair   Executive Functions  Concentration:Fair  Attention Span:Fair  [place]   Psychomotor Activity  Psychomotor Activity:No data recorded  Assets  Assets:Communication Skills; Desire for Improvement; Financial Resources/Insurance; Housing; Social Support   Sleep  Sleep:No data recorded   Physical Exam: Physical Exam Vitals and nursing note reviewed.  Constitutional:      Appearance: Normal appearance.  HENT:     Head: Normocephalic and atraumatic.     Mouth/Throat:     Pharynx: Oropharynx is clear.  Eyes:     Pupils: Pupils are equal, round, and reactive to light.  Cardiovascular:     Rate and Rhythm: Normal rate and regular rhythm.  Pulmonary:     Effort: Pulmonary effort is normal.     Breath sounds: Normal breath sounds.  Abdominal:     General: Abdomen is flat.     Palpations: Abdomen is soft.  Musculoskeletal:        General: Normal range of motion.  Skin:    General: Skin is warm and dry.  Neurological:     General: No focal  deficit present.     Mental Status: She is alert. Mental status is at baseline.  Psychiatric:        Attention and Perception: She is inattentive.        Mood and Affect: Mood is anxious.        Speech: Speech normal.        Thought Content: Thought content is paranoid.        Cognition and Memory: Cognition is impaired. Memory is impaired.   Review of Systems  Constitutional: Negative.   HENT: Negative.    Eyes: Negative.   Respiratory: Negative.    Cardiovascular: Negative.   Gastrointestinal: Negative.   Musculoskeletal: Negative.   Skin: Negative.   Neurological: Negative.   Psychiatric/Behavioral:  The patient is nervous/anxious.   Blood pressure 139/79, pulse 81, temperature 97.6 F (36.4 C), temperature source Oral, resp. rate 18, height 5\' 3"  (1.6 m), weight 87.5 kg, SpO2 98 %. Body mass index is 34.17 kg/m.   Treatment Plan Summary: Plan no change to medication management.  Continue antipsychotics.  Encourage patient to be medicine compliant.  Encourage patient to talk with full treatment team about answering her questions about discharge planning  Alethia Berthold, MD 07/28/2021, 2:53 PM

## 2021-07-29 DIAGNOSIS — F332 Major depressive disorder, recurrent severe without psychotic features: Secondary | ICD-10-CM | POA: Diagnosis not present

## 2021-07-29 NOTE — Group Note (Signed)
[facility] LCSW Group Therapy Note   Group Date: 07/29/2021 Start Time: 1430 End Time: 1530  Type of Therapy/Topic:  Group Therapy:  Feelings about Diagnosis  Participation Level:  Minimal     Description of Group:    This group will allow patients to explore their thoughts and feelings about diagnoses they have received. Patients will be guided to explore their level of understanding and acceptance of these diagnoses. Facilitator will encourage patients to process their thoughts and feelings about the reactions of others to their diagnosis, and will guide patients in identifying ways to discuss their diagnosis with significant others in their lives. This group will be process-oriented, with patients participating in exploration of their own experiences as well as giving and receiving support and challenge from other group members.   Therapeutic Goals: 1. Patient will demonstrate understanding of diagnosis as evidence by identifying two or more symptoms of the disorder:  2. Patient will be able to express two feelings regarding the diagnosis 3. Patient will demonstrate ability to communicate their needs through discussion and/or role plays  Summary of Patient Progress:    Patient was present for the entirety of the individual session. Patient was an active listener and participated in the topic of discussion,had a somewhat irritated mood. She stated that she felt ready to go home. She said she has been taking her medication even though she does not like it. She said she is not sure it's making her better and continued to emphasize her readiness for discharge.      Therapeutic Modalities:   Cognitive Behavioral Therapy Brief Therapy Feelings Identification    Annaclaire Walsworth A [place], LCSWA

## 2021-07-29 NOTE — Progress Notes (Signed)
[facility] MD Progress Note  07/29/2021 11:09 AM Becky Davis  MRN:  [id number] Subjective:  Becky Davis continues to be confused at times.  Recently, there is no evidence that she has been cheeking her medications.  The nurses state that they think that she is a little bit better because she has not had any outbursts, her thought process is more goal-directed even though she is still very tangential and circumstantial.  She still talks about machines in her room and hearing voices.  She is better able to carry on a coherent conversation than she has in the past.  She is also sleeping a little bit better even though it is not always at night.  There is no evidence of EPS or TD.  She seems to be tolerating the medications and denies any side effects.  Principal Problem: Dementia and Psychosis Diagnosis: Dementia and Psychosis Total Time spent with patient: 15 min  Past Psychiatric History: Dr. Toy Care  Past Medical History:  Past Medical History:  Diagnosis Date   Arthritis    knees   Asthma    Headache    hx of 20 years ago   Heart murmur    hx of 10-15 years ago   Hypertension    Hypothyroidism    Pneumonia    Pre-diabetes    Thyroid nodule     Past Surgical History:  Procedure Laterality Date   ABDOMINAL HYSTERECTOMY     thyroid goiter     surgery   THYROID LOBECTOMY     06-25-17 Dr. Harlow Asa   Left   THYROID LOBECTOMY Left 06/25/2017   Procedure: LEFT THYROID LOBECTOMY;  Surgeon: Armandina Gemma, MD;  Location: [facility];  Service: General;  Laterality: Left;   TOTAL KNEE ARTHROPLASTY Right 12/20/2019   Procedure: TOTAL KNEE ARTHROPLASTY;  Surgeon: Marchia Bond, MD;  Location: [facility];  Service: Orthopedics;  Laterality: Right;   Family History:  Family History  Problem Relation Age of Onset   Emphysema Mother    Cancer Mother    Heart disease Mother    Cancer Sister    Cancer Brother    Family Psychiatric  History: Both parents with Alzheimer's  Social History:  Social History    Substance and Sexual Activity  Alcohol Use No     Social History   Substance and Sexual Activity  Drug Use No    Social History   Socioeconomic History   Marital status: Married    Spouse name: Not on file   Number of children: 3   Years of education: Not on file   Highest education level: Not on file  Occupational History   Not on file  Tobacco Use   Smoking status: Never   Smokeless tobacco: Never  Vaping Use   Vaping Use: Never used  Substance and Sexual Activity   Alcohol use: No   Drug use: No   Sexual activity: Not Currently  Other Topics Concern   Not on file  Social History Narrative   Not on file   Social Determinants of Health   Financial Resource Strain: Not on file  Food Insecurity: Not on file  Transportation Needs: Not on file  Physical Activity: Not on file  Stress: Not on file  Social Connections: Not on file   Additional Social History:                         Sleep: Poor  Appetite:  Fair  Current Medications:  Current Facility-Administered Medications  Medication Dose Route Frequency Provider Last Rate Last Admin   acetaminophen (TYLENOL) tablet 650 mg  650 mg Oral Q6H PRN Parks Ranger, DO   650 mg at 07/28/21 1411   albuterol (PROVENTIL) (2.5 MG/3ML) 0.083% nebulizer solution 2.5 mg  2.5 mg Nebulization Q4H PRN Parks Ranger, DO       albuterol (VENTOLIN HFA) 108 (90 Base) MCG/ACT inhaler 2 puff  2 puff Inhalation Q6H PRN Clapacs, Madie Reno, MD   2 puff at 07/29/21 0449   alum & mag hydroxide-simeth (MAALOX/MYLANTA) 200-200-20 MG/5ML suspension 30 mL  30 mL Oral Q4H PRN Parks Ranger, DO       benztropine (COGENTIN) tablet 1 mg  1 mg Oral BH-q8a4p Parks Ranger, DO   1 mg at 07/29/21 8921   chlorproMAZINE (THORAZINE) tablet 50 mg  50 mg Oral QHS Parks Ranger, DO   50 mg at 07/28/21 2112   diphenhydrAMINE (BENADRYL) capsule 50 mg  50 mg Oral Q4H PRN Parks Ranger, DO   50  mg at 07/26/21 2117   Or   diphenhydrAMINE (BENADRYL) injection 50 mg  50 mg Intramuscular Q4H PRN Parks Ranger, DO       donepezil (ARICEPT) tablet 5 mg  5 mg Oral QHS [name], [name]   5 mg at 07/28/21 2112   gabapentin (NEURONTIN) capsule 200 mg  200 mg Oral TID Wynell Balloon, [name]   200 mg at 07/29/21 1041   haloperidol (HALDOL) tablet 5 mg  5 mg Oral BH-q8a4p Parks Ranger, DO   5 mg at 07/29/21 1941   haloperidol (HALDOL) tablet 5 mg  5 mg Oral Q4H PRN Parks Ranger, DO   5 mg at 07/26/21 2117   Or   haloperidol lactate (HALDOL) injection 5 mg  5 mg Intramuscular Q4H PRN Parks Ranger, DO       levothyroxine (SYNTHROID) tablet 100 mcg  100 mcg Oral QAC breakfast Parks Ranger, DO   100 mcg at 07/29/21 7408   loratadine (CLARITIN) tablet 10 mg  10 mg Oral Daily Parks Ranger, DO   10 mg at 07/29/21 1041   LORazepam (ATIVAN) tablet 2 mg  2 mg Oral Q4H PRN Parks Ranger, DO   2 mg at 07/26/21 2221   Or   LORazepam (ATIVAN) injection 2 mg  2 mg Intramuscular Q4H PRN Parks Ranger, DO       magnesium hydroxide (MILK OF MAGNESIA) suspension 30 mL  30 mL Oral Daily PRN Parks Ranger, DO       mirtazapine (REMERON) tablet 15 mg  15 mg Oral QHS Parks Ranger, DO   15 mg at 07/28/21 2112   montelukast (SINGULAIR) tablet 10 mg  10 mg Oral QHS Parks Ranger, DO   10 mg at 07/28/21 2112    Lab Results: No results found for this or any previous visit (from the past 48 hour(s)).  Blood Alcohol level:  Lab Results  Component Value Date   ETH <10 14/48/1856    Metabolic Disorder Labs: Lab Results  Component Value Date   HGBA1C 5.4 07/19/2021   MPG 108.28 07/19/2021   MPG 119.76 12/13/2019   No results found for: PROLACTIN Lab Results  Component Value Date   CHOL 178 07/19/2021   TRIG 85 07/19/2021   HDL 49 07/19/2021   CHOLHDL 3.6 07/19/2021   VLDL 17 07/19/2021   LDLCALC 112 (H)  07/19/2021  Physical Findings: AIMS:  , ,  ,  ,    CIWA:    COWS:     Musculoskeletal: Strength & Muscle Tone: within normal limits Gait & Station: normal Patient leans: N/A  Psychiatric Specialty Exam:  Presentation  General Appearance: Casual; Disheveled  Eye Contact:Fair  Speech:Pressured  Speech Volume:Increased  Handedness:No data recorded  Mood and Affect  Mood:Anxious  Affect:Appropriate   Thought Process  Thought Processes:Disorganized  Descriptions of Associations:Loose  Orientation:Partial  Thought Content:Perseveration; Scattered  History of Schizophrenia/Schizoaffective disorder:No  Duration of Psychotic Symptoms:Greater than six months  Hallucinations:No data recorded Ideas of Reference:Delusions  Suicidal Thoughts:No data recorded Homicidal Thoughts:No data recorded  Sensorium  Memory:Immediate Poor; Remote Poor  Judgment:Fair  Insight:Fair   Executive Functions  Concentration:Fair  Attention Span:Fair  [place]   Psychomotor Activity  Psychomotor Activity:No data recorded  Assets  Assets:Communication Skills; Desire for Improvement; Financial Resources/Insurance; Housing; Social Support   Sleep  Sleep:No data recorded   Physical Exam: Physical Exam Vitals and nursing note reviewed.  Constitutional:      Appearance: Normal appearance. She is normal weight.  Neurological:     General: No focal deficit present.     Mental Status: She is alert and oriented to person, place, and time.  Psychiatric:        Attention and Perception: Attention normal. She perceives auditory hallucinations.        Mood and Affect: Mood and affect normal.        Speech: Speech is tangential.        Behavior: Behavior normal. Behavior is cooperative.        Thought Content: Thought content is delusional.        Cognition and Memory: Cognition is impaired. Memory is impaired.         Judgment: Judgment is impulsive.   Review of Systems  Constitutional: Negative.   HENT: Negative.    Eyes: Negative.   Respiratory: Negative.    Cardiovascular: Negative.   Gastrointestinal: Negative.   Genitourinary: Negative.   Musculoskeletal: Negative.   Skin: Negative.   Neurological: Negative.   Endo/Heme/Allergies: Negative.   Blood pressure (!) 153/84, pulse 79, temperature (!) 97.5 F (36.4 C), temperature source Oral, resp. rate 18, height 5\' 3"  (1.6 m), weight 87.5 kg, SpO2 100 %. Body mass index is 34.17 kg/m.   Treatment Plan Summary: Daily contact with patient to assess and evaluate symptoms and progress in treatment, Medication management, and Plan I will discuss a long-acting Haldol decanoate with the patient.  [place], DO 07/29/2021, 11:09 AM

## 2021-07-29 NOTE — Progress Notes (Signed)
Recreation Therapy Notes   Date: 07/29/2021  Time: 1:20 pm    Location: Quiet Room    Behavioral response: Appropriate  Intervention Topic:  Goals      Discussion/Intervention:  Group content on today was focused on goals. Patients described what goals are and how they define goals. Individuals expressed how they go about setting goals and reaching them. The group identified how important goals are and if they make short term goals to reach long term goals. Patients described how many goals they work on at a time and what affects them not reaching their goal. Individuals described how much time they put into planning and obtaining their goals. The group participated in the intervention My Goal Board and made personal goal boards to help them achieve their goal. Clinical Observations/Feedback: Patient came to group late and stated that her goal is to live to see her grandchild graduate and go to college. Individual participated in the intervention. Participant fell asleep on and off during group, she eventually left group early to get some rest.  Alla Sloma LRT/CTRS         Francesco Provencal 07/29/2021 2:49 PM

## 2021-07-29 NOTE — Progress Notes (Addendum)
Patient is alert and oriented times 3. Patient rates pain as 0/10. She denies SI, HI, and AVH. Also denies feelings of anxiety and depression at this time. States she didn't get much sleep last night-is now in bed asleep. Morning meds given whole by mouth after lots of encouragement. Ate breakfast in day room- appetite fair. Patient c/o having a asthma flair up and allergies- this Probation officer let patient know that I will notify the doctor. PRN inhaler given. Patient noted talking to self. Patient remains on unit with Q15 minute checks in place.

## 2021-07-29 NOTE — Progress Notes (Signed)
Pt visible in the milieu socializing with her husband, and after he left, with peers. Pt given prn tylenol for complaints of "sciatic pain" on the rt leg. Pt also spent some time on the phone talking to her daughter "Nira Conn". Pt cooperative, but intrusive at times. Q15 minute safety checks maintained. Will continue to monitor.

## 2021-07-29 NOTE — BH IP Treatment Plan (Signed)
Interdisciplinary Treatment and Diagnostic Plan Update  07/29/2021 Time of Session: 10:00AM THARA SEARING MRN: [id number]  Principal Diagnosis: MDD (major depressive disorder), recurrent severe, without psychosis ([place])  Secondary Diagnoses: Principal Problem:   MDD (major depressive disorder), recurrent severe, without psychosis ([place]) Active Problems:   Depression   Current Medications:  Current Facility-Administered Medications  Medication Dose Route Frequency Provider Last Rate Last Admin   acetaminophen (TYLENOL) tablet 650 mg  650 mg Oral Q6H PRN Parks Ranger, DO   650 mg at 07/28/21 1411   albuterol (PROVENTIL) (2.5 MG/3ML) 0.083% nebulizer solution 2.5 mg  2.5 mg Nebulization Q4H PRN Parks Ranger, DO       albuterol (VENTOLIN HFA) 108 (90 Base) MCG/ACT inhaler 2 puff  2 puff Inhalation Q6H PRN Clapacs, Madie Reno, MD   2 puff at 07/29/21 0449   alum & mag hydroxide-simeth (MAALOX/MYLANTA) 200-200-20 MG/5ML suspension 30 mL  30 mL Oral Q4H PRN Parks Ranger, DO       benztropine (COGENTIN) tablet 1 mg  1 mg Oral BH-q8a4p Parks Ranger, DO   1 mg at 07/29/21 4174   chlorproMAZINE (THORAZINE) tablet 50 mg  50 mg Oral QHS Parks Ranger, DO   50 mg at 07/28/21 2112   diphenhydrAMINE (BENADRYL) capsule 50 mg  50 mg Oral Q4H PRN Parks Ranger, DO   50 mg at 07/26/21 2117   Or   diphenhydrAMINE (BENADRYL) injection 50 mg  50 mg Intramuscular Q4H PRN Parks Ranger, DO       donepezil (ARICEPT) tablet 5 mg  5 mg Oral QHS [name], [name]   5 mg at 07/28/21 2112   gabapentin (NEURONTIN) capsule 200 mg  200 mg Oral TID Wynell Balloon, [name]   200 mg at 07/29/21 1041   haloperidol (HALDOL) tablet 5 mg  5 mg Oral BH-q8a4p Parks Ranger, DO   5 mg at 07/29/21 0804   haloperidol (HALDOL) tablet 5 mg  5 mg Oral Q4H PRN Parks Ranger, DO   5 mg at 07/26/21 2117   Or   haloperidol lactate (HALDOL) injection 5 mg  5 mg  Intramuscular Q4H PRN Parks Ranger, DO       levothyroxine (SYNTHROID) tablet 100 mcg  100 mcg Oral QAC breakfast Parks Ranger, DO   100 mcg at 07/29/21 0814   loratadine (CLARITIN) tablet 10 mg  10 mg Oral Daily Parks Ranger, DO   10 mg at 07/29/21 1041   LORazepam (ATIVAN) tablet 2 mg  2 mg Oral Q4H PRN Parks Ranger, DO   2 mg at 07/26/21 2221   Or   LORazepam (ATIVAN) injection 2 mg  2 mg Intramuscular Q4H PRN Parks Ranger, DO       magnesium hydroxide (MILK OF MAGNESIA) suspension 30 mL  30 mL Oral Daily PRN Parks Ranger, DO       mirtazapine (REMERON) tablet 15 mg  15 mg Oral QHS Parks Ranger, DO   15 mg at 07/28/21 2112   montelukast (SINGULAIR) tablet 10 mg  10 mg Oral QHS Parks Ranger, DO   10 mg at 07/28/21 2112   PTA Medications: Medications Prior to Admission  Medication Sig Dispense Refill Last Dose   albuterol (PROAIR HFA) 108 (90 Base) MCG/ACT inhaler Inhale 2 puffs into the lungs as needed (Shortness of breath). 6.7 g 3 07/17/2021   albuterol (PROVENTIL) (2.5 MG/3ML) 0.083% nebulizer solution Take 2.5 mg by nebulization  every 4 (four) hours as needed for wheezing or shortness of breath.   07/17/2021   ARIPiprazole (ABILIFY) 5 MG tablet Take 5 mg by mouth at bedtime.    07/17/2021   budesonide-formoterol (SYMBICORT) 160-4.5 MCG/ACT inhaler Inhale 2 puffs into the lungs 2 (two) times daily. 1 Inhaler 6 07/17/2021   calcitRIOL (ROCALTROL) 0.25 MCG capsule Take 0.25 mcg by mouth daily.   07/17/2021   Calcium Carbonate-Vitamin D (CALTRATE 600+D PO) Take 500 mg by mouth 2 (two) times daily.    07/17/2021   cetirizine (ZYRTEC) 10 MG tablet Take 10 mg by mouth daily.   07/17/2021   gabapentin (NEURONTIN) 100 MG capsule Take 100 mg by mouth 3 (three) times daily.   07/17/2021   hydrALAZINE (APRESOLINE) 10 MG tablet Take 5 mg by mouth 3 (three) times daily.   07/17/2021   levothyroxine (SYNTHROID, LEVOTHROID) 100 MCG  tablet Take 100 mcg by mouth daily before breakfast.   07/17/2021   losartan (COZAAR) 100 MG tablet Take 0.5 tablets (50 mg total) by mouth daily. 5 tablet 0 07/17/2021   montelukast (SINGULAIR) 10 MG tablet Take 10 mg by mouth at bedtime.   07/17/2021   PARoxetine (PAXIL) 20 MG tablet Take 20 mg by mouth at bedtime.    07/17/2021   aspirin EC 325 MG tablet Take 1 tablet (325 mg total) by mouth 2 (two) times daily. 60 tablet 0    baclofen (LIORESAL) 10 MG tablet Take 1 tablet (10 mg total) by mouth 3 (three) times daily. As needed for muscle spasm (Patient taking differently: Take 10 mg by mouth 2 (two) times daily. As needed for muscle spasm) 50 tablet 0    HYDROcodone-acetaminophen (NORCO) 10-325 MG tablet Take 1 tablet by mouth every 6 (six) hours as needed. 28 tablet 0    sennosides-docusate sodium (SENOKOT-S) 8.6-50 MG tablet Take 2 tablets by mouth daily. 30 tablet 1     Patient Stressors:    Patient Strengths: Supportive family/friends   Treatment Modalities: Medication Management, Group therapy, Case management,  1 to 1 session with clinician, Psychoeducation, Recreational therapy.   Physician Treatment Plan for Primary Diagnosis: MDD (major depressive disorder), recurrent severe, without psychosis ([place]) Long Term Goal(s): Improvement in symptoms so as ready for discharge   Short Term Goals: Ability to identify changes in lifestyle to reduce recurrence of condition will improve Ability to verbalize feelings will improve Ability to disclose and discuss suicidal ideas Ability to demonstrate self-control will improve Ability to identify and develop effective coping behaviors will improve Ability to maintain clinical measurements within normal limits will improve Compliance with prescribed medications will improve Ability to identify triggers associated with substance abuse/mental health issues will improve  Medication Management: Evaluate patient's response, side effects, and tolerance of  medication regimen.  Therapeutic Interventions: 1 to 1 sessions, Unit Group sessions and Medication administration.  Evaluation of Outcomes: Progressing  Physician Treatment Plan for Secondary Diagnosis: Principal Problem:   MDD (major depressive disorder), recurrent severe, without psychosis ([place]) Active Problems:   Depression  Long Term Goal(s): Improvement in symptoms so as ready for discharge   Short Term Goals: Ability to identify changes in lifestyle to reduce recurrence of condition will improve Ability to verbalize feelings will improve Ability to disclose and discuss suicidal ideas Ability to demonstrate self-control will improve Ability to identify and develop effective coping behaviors will improve Ability to maintain clinical measurements within normal limits will improve Compliance with prescribed medications will improve Ability to identify triggers associated  with substance abuse/mental health issues will improve     Medication Management: Evaluate patient's response, side effects, and tolerance of medication regimen.  Therapeutic Interventions: 1 to 1 sessions, Unit Group sessions and Medication administration.  Evaluation of Outcomes: Progressing   RN Treatment Plan for Primary Diagnosis: MDD (major depressive disorder), recurrent severe, without psychosis ([place]) Long Term Goal(s): Knowledge of disease and therapeutic regimen to maintain health will improve  Short Term Goals: Ability to remain free from injury will improve, Ability to verbalize frustration and anger appropriately will improve, Ability to demonstrate self-control, Ability to participate in decision making will improve, Ability to verbalize feelings will improve, Ability to identify and develop effective coping behaviors will improve, and Compliance with prescribed medications will improve  Medication Management: RN will administer medications as ordered by provider, will assess and evaluate patient's  response and provide education to patient for prescribed medication. RN will report any adverse and/or side effects to prescribing provider.  Therapeutic Interventions: 1 on 1 counseling sessions, Psychoeducation, Medication administration, Evaluate responses to treatment, Monitor vital signs and CBGs as ordered, Perform/monitor CIWA, COWS, AIMS and Fall Risk screenings as ordered, Perform wound care treatments as ordered.  Evaluation of Outcomes: Progressing   LCSW Treatment Plan for Primary Diagnosis: MDD (major depressive disorder), recurrent severe, without psychosis ([place]) Long Term Goal(s): Safe transition to appropriate next level of care at discharge, Engage patient in therapeutic group addressing interpersonal concerns.  Short Term Goals: Engage patient in aftercare planning with referrals and resources, Increase social support, Increase ability to appropriately verbalize feelings, Increase emotional regulation, Facilitate acceptance of mental health diagnosis and concerns, and Identify triggers associated with mental health/substance abuse issues  Therapeutic Interventions: Assess for all discharge needs, 1 to 1 time with Social worker, Explore available resources and support systems, Assess for adequacy in community support network, Educate family and significant other(s) on suicide prevention, Complete Psychosocial Assessment, Interpersonal group therapy.  Evaluation of Outcomes: Progressing   Progress in Treatment: Attending groups: Yes. Participating in groups: Yes. Taking medication as prescribed: Yes. Toleration medication: Yes. Family/Significant other contact made: Yes, individual(s) contacted:  pt's partner and daughter Patient understands diagnosis: No. Discussing patient identified problems/goals with staff: Yes. Medical problems stabilized or resolved: Yes. Denies suicidal/homicidal ideation: Yes. Issues/concerns per patient self-inventory: No. Other: None  New  problem(s) identified: No, Describe:  None  New Short Term/Long Term Goal(s): Patient to work towards elimination of symptoms of psychosis, medication management for mood stabilization; development of comprehensive mental wellness/sobriety plan. Update 07/24/21: No changes at this time. Update 07/29/21: No changes at this time.      Patient Goals:  "to get out of here.Marland Kitchen exercise, lose 5 pounds" Update 07/24/21: No changes at this time. Update 07/29/21: No changes at this time.      Discharge Plan or Barriers: CSW will assist pt in development of appropriate discharge/aftercare plan. Update 07/24/21: No changes at this time. Update 07/29/21: Pt's family requested assistance in referral for home health care upon discharge. CSW will follow up with referrals and update family accordingly.      Reason for Continuation of Hospitalization:  Delusions  Hallucinations Medication stabilization   Estimated Length of Stay: TBD  Scribe for Treatment Team: Hanna Ra A [place], Latanya Presser 07/29/2021 10:44 AM

## 2021-07-30 DIAGNOSIS — F332 Major depressive disorder, recurrent severe without psychotic features: Secondary | ICD-10-CM | POA: Diagnosis not present

## 2021-07-30 MED ORDER — HALOPERIDOL 2 MG PO TABS
2.0000 mg | ORAL_TABLET | ORAL | Status: DC
  Administered 2021-07-30 – 2021-08-06 (×14): 2 mg via ORAL
  Filled 2021-07-30 (×15): qty 1

## 2021-07-30 MED ORDER — HALOPERIDOL DECANOATE 100 MG/ML IM SOLN
50.0000 mg | INTRAMUSCULAR | Status: DC
  Administered 2021-07-30: 50 mg via INTRAMUSCULAR
  Filled 2021-07-30: qty 0.5

## 2021-07-30 NOTE — Plan of Care (Signed)
Patient is alert and oriented times 4. Today she is angry with her husband for bringing her here, stated "I wanna just give him the finger". Patient denies pain. She denies SI, HI, and AVH. Also denies feelings of anxiety and depression at this time. States she did not rest well last night. Morning meds given whole by mouth with encouragement and explanation of medication. Ate breakfast in day room- appetite fair. Patient continues to pace the halls, use the phone constantly, and ask for ice water constantly. She does not like watching TV and doesn't like music playing on the TV. Appears extremely tired and worn out, but refuses to lie down in her room. Patient remains on unit with Q15 minute checks in place.   Problem: Education: Goal: Knowledge of [facility] [facility] Education information/materials will improve Outcome: Progressing   Problem: Coping: Goal: Ability to demonstrate self-control will improve Outcome: Progressing   Problem: Health Behavior/Discharge Planning: Goal: Identification of resources available to assist in meeting health care needs will improve Outcome: Progressing Goal: Compliance with treatment plan for underlying cause of condition will improve Outcome: Progressing   Problem: Physical Regulation: Goal: Ability to maintain clinical measurements within normal limits will improve Outcome: Progressing   Problem: Safety: Goal: Periods of time without injury will increase Outcome: Progressing   Problem: Activity: Goal: Will verbalize the importance of balancing activity with adequate rest periods Outcome: Progressing   Problem: Coping: Goal: Coping ability will improve Outcome: Progressing Goal: Will verbalize feelings Outcome: Progressing   Problem: Health Behavior/Discharge Planning: Goal: Compliance with prescribed medication regimen will improve Outcome: Progressing   Problem: Nutritional: Goal: Ability to achieve adequate nutritional intake will  improve Outcome: Progressing   Problem: Role Relationship: Goal: Ability to communicate needs accurately will improve Outcome: Progressing   Problem: Safety: Goal: Ability to redirect hostility and anger into socially appropriate behaviors will improve Outcome: Progressing Goal: Ability to remain free from injury will improve Outcome: Progressing   Problem: Self-Care: Goal: Ability to participate in self-care as condition permits will improve Outcome: Progressing   Problem: Self-Concept: Goal: Will verbalize positive feelings about self Outcome: Progressing   Problem: Safety: Goal: Violent Restraint(s) Outcome: Progressing

## 2021-07-30 NOTE — Progress Notes (Signed)
Pt given prn nebulizer treatment for wheezing/coughing. HOB elevated. Relief noted. Pt sleeping soundly.

## 2021-07-30 NOTE — Progress Notes (Signed)
[facility] MD Progress Note  07/30/2021 11:33 AM Becky Davis  MRN:  [id number] Subjective: Lillyann states that she is feeling better.  She states that she is taking her medications.  She denies any side effects from her medicines.  There is no evidence of EPS or TD.  She tells me that she thinks that she is sleeping better.  When I asked her if she is still hearing voices or machines in her room and she said no.  I spoke with her daughter who is power of attorney and we are shooting to discharge her on Friday.  I am going to give her Haldol decanoate 50 mg IM injection today and decrease her oral Haldol.  Principal Problem: MDD (major depressive disorder), recurrent severe, without psychosis ([place]) Diagnosis: Principal Problem:   MDD (major depressive disorder), recurrent severe, without psychosis ([place]) Active Problems:   Depression  Total Time spent with patient: 15 minutes  Past Psychiatric History: Dr. Toy Care  Past Medical History:  Past Medical History:  Diagnosis Date   Arthritis    knees   Asthma    Headache    hx of 20 years ago   Heart murmur    hx of 10-15 years ago   Hypertension    Hypothyroidism    Pneumonia    Pre-diabetes    Thyroid nodule     Past Surgical History:  Procedure Laterality Date   ABDOMINAL HYSTERECTOMY     thyroid goiter     surgery   THYROID LOBECTOMY     06-25-17 Dr. Harlow Asa   Left   THYROID LOBECTOMY Left 06/25/2017   Procedure: LEFT THYROID LOBECTOMY;  Surgeon: Armandina Gemma, MD;  Location: [facility];  Service: General;  Laterality: Left;   TOTAL KNEE ARTHROPLASTY Right 12/20/2019   Procedure: TOTAL KNEE ARTHROPLASTY;  Surgeon: Marchia Bond, MD;  Location: [facility];  Service: Orthopedics;  Laterality: Right;   Family History:  Family History  Problem Relation Age of Onset   Emphysema Mother    Cancer Mother    Heart disease Mother    Cancer Sister    Cancer Brother     Social History:  Social History   Substance and Sexual Activity  Alcohol  Use No     Social History   Substance and Sexual Activity  Drug Use No    Social History   Socioeconomic History   Marital status: Married    Spouse name: Not on file   Number of children: 3   Years of education: Not on file   Highest education level: Not on file  Occupational History   Not on file  Tobacco Use   Smoking status: Never   Smokeless tobacco: Never  Vaping Use   Vaping Use: Never used  Substance and Sexual Activity   Alcohol use: No   Drug use: No   Sexual activity: Not Currently  Other Topics Concern   Not on file  Social History Narrative   Not on file   Social Determinants of Health   Financial Resource Strain: Not on file  Food Insecurity: Not on file  Transportation Needs: Not on file  Physical Activity: Not on file  Stress: Not on file  Social Connections: Not on file   Additional Social History:                         Sleep: Fair  Appetite:  Good  Current Medications: Current Facility-Administered Medications  Medication Dose  Route Frequency Provider Last Rate Last Admin   acetaminophen (TYLENOL) tablet 650 mg  650 mg Oral Q6H PRN Parks Ranger, DO   650 mg at 07/29/21 1930   albuterol (PROVENTIL) (2.5 MG/3ML) 0.083% nebulizer solution 2.5 mg  2.5 mg Nebulization Q4H PRN Parks Ranger, DO   2.5 mg at 07/30/21 0127   albuterol (VENTOLIN HFA) 108 (90 Base) MCG/ACT inhaler 2 puff  2 puff Inhalation Q6H PRN Clapacs, Madie Reno, MD   2 puff at 07/29/21 2107   alum & mag hydroxide-simeth (MAALOX/MYLANTA) 200-200-20 MG/5ML suspension 30 mL  30 mL Oral Q4H PRN Parks Ranger, DO       benztropine (COGENTIN) tablet 1 mg  1 mg Oral BH-q8a4p Parks Ranger, DO   1 mg at 07/30/21 8242   chlorproMAZINE (THORAZINE) tablet 50 mg  50 mg Oral QHS Parks Ranger, DO   50 mg at 07/29/21 2104   diphenhydrAMINE (BENADRYL) capsule 50 mg  50 mg Oral Q4H PRN Parks Ranger, DO   50 mg at 07/26/21 2117    Or   diphenhydrAMINE (BENADRYL) injection 50 mg  50 mg Intramuscular Q4H PRN Parks Ranger, DO       donepezil (ARICEPT) tablet 5 mg  5 mg Oral QHS [name], [name]   5 mg at 07/29/21 2104   gabapentin (NEURONTIN) capsule 200 mg  200 mg Oral TID Wynell Balloon, [name]   200 mg at 07/30/21 0942   haloperidol (HALDOL) tablet 5 mg  5 mg Oral BH-q8a4p Parks Ranger, DO   5 mg at 07/30/21 3536   haloperidol (HALDOL) tablet 5 mg  5 mg Oral Q4H PRN Parks Ranger, DO   5 mg at 07/26/21 2117   Or   haloperidol lactate (HALDOL) injection 5 mg  5 mg Intramuscular Q4H PRN Parks Ranger, DO       haloperidol decanoate (HALDOL DECANOATE) 100 MG/ML injection 50 mg  50 mg Intramuscular Q30 days Parks Ranger, DO       levothyroxine (SYNTHROID) tablet 100 mcg  100 mcg Oral QAC breakfast Parks Ranger, DO   100 mcg at 07/30/21 0513   loratadine (CLARITIN) tablet 10 mg  10 mg Oral Daily Parks Ranger, DO   10 mg at 07/30/21 1443   LORazepam (ATIVAN) tablet 2 mg  2 mg Oral Q4H PRN Parks Ranger, DO   2 mg at 07/26/21 2221   Or   LORazepam (ATIVAN) injection 2 mg  2 mg Intramuscular Q4H PRN Parks Ranger, DO       magnesium hydroxide (MILK OF MAGNESIA) suspension 30 mL  30 mL Oral Daily PRN Parks Ranger, DO       mirtazapine (REMERON) tablet 15 mg  15 mg Oral QHS Parks Ranger, DO   15 mg at 07/29/21 2103   montelukast (SINGULAIR) tablet 10 mg  10 mg Oral QHS Parks Ranger, DO   10 mg at 07/29/21 2104    Lab Results: No results found for this or any previous visit (from the past 48 hour(s)).  Blood Alcohol level:  Lab Results  Component Value Date   ETH <10 15/40/0867    Metabolic Disorder Labs: Lab Results  Component Value Date   HGBA1C 5.4 07/19/2021   MPG 108.28 07/19/2021   MPG 119.76 12/13/2019   No results found for: PROLACTIN Lab Results  Component Value Date   CHOL 178 07/19/2021    TRIG 85 07/19/2021  HDL 49 07/19/2021   CHOLHDL 3.6 07/19/2021   VLDL 17 07/19/2021   LDLCALC 112 (H) 07/19/2021    Physical Findings: AIMS:  , ,  ,  ,    CIWA:    COWS:     Musculoskeletal: Strength & Muscle Tone: within normal limits Gait & Station: normal Patient leans: N/A  Psychiatric Specialty Exam:  Presentation  General Appearance: Casual; Disheveled  Eye Contact:Fair  Speech:Pressured  Speech Volume:Increased  Handedness:No data recorded  Mood and Affect  Mood:Anxious  Affect:Appropriate   Thought Process  Thought Processes:Disorganized  Descriptions of Associations:Loose  Orientation:Partial  Thought Content:Perseveration; Scattered  History of Schizophrenia/Schizoaffective disorder:No  Duration of Psychotic Symptoms:Greater than six months  Hallucinations:No data recorded Ideas of Reference:Delusions  Suicidal Thoughts:No data recorded Homicidal Thoughts:No data recorded  Sensorium  Memory:Immediate Poor; Remote Poor  Judgment:Fair  Insight:Fair   Executive Functions  Concentration:Fair  Attention Span:Fair  [place]   Psychomotor Activity  Psychomotor Activity:No data recorded  Assets  Assets:Communication Skills; Desire for Improvement; Financial Resources/Insurance; Housing; Social Support   Sleep  Sleep:No data recorded   Physical Exam: Physical Exam Vitals and nursing note reviewed.  Constitutional:      Appearance: Normal appearance. She is normal weight.  Neurological:     General: No focal deficit present.     Mental Status: She is alert and oriented to person, place, and time.  Psychiatric:        Attention and Perception: Attention and perception normal.        Mood and Affect: Mood and affect normal.        Speech: Speech normal.        Behavior: Behavior is uncooperative.        Thought Content: Thought content normal.        Cognition and Memory:  Cognition and memory normal.        Judgment: Judgment is inappropriate.   Review of Systems  Constitutional: Negative.   HENT: Negative.    Eyes: Negative.   Respiratory: Negative.    Cardiovascular: Negative.   Gastrointestinal: Negative.   Genitourinary: Negative.   Musculoskeletal: Negative.   Skin: Negative.   Neurological: Negative.   Endo/Heme/Allergies: Negative.   Psychiatric/Behavioral: Negative.    Blood pressure 140/74, pulse 77, temperature 97.6 F (36.4 C), temperature source Oral, resp. rate 18, height 5\' 3"  (1.6 m), weight 87.5 kg, SpO2 99 %. Body mass index is 34.17 kg/m.   Treatment Plan Summary: Daily contact with patient to assess and evaluate symptoms and progress in treatment, Medication management, and Plan decrease Haldol to 2 mg twice a day and start Haldol decanoate 50 mg IM every 30 days.  Parks Ranger, DO 07/30/2021, 11:33 AM

## 2021-07-30 NOTE — Plan of Care (Signed)
Denies SI/HI/AVH.  Problem: Education: Goal: Knowledge of [place] [facility] Education information/materials will improve Outcome: Progressing Goal: Emotional status will improve Outcome: Progressing Goal: Mental status will improve Outcome: Progressing Goal: Verbalization of understanding the information provided will improve Outcome: Progressing   Problem: Activity: Goal: Interest or engagement in activities will improve Outcome: Progressing Goal: Sleeping patterns will improve Outcome: Progressing   Problem: Coping: Goal: Ability to verbalize frustrations and anger appropriately will improve Outcome: Progressing Goal: Ability to demonstrate self-control will improve Outcome: Progressing   Problem: Health Behavior/Discharge Planning: Goal: Identification of resources available to assist in meeting health care needs will improve Outcome: Progressing Goal: Compliance with treatment plan for underlying cause of condition will improve Outcome: Progressing   Problem: Physical Regulation: Goal: Ability to maintain clinical measurements within normal limits will improve Outcome: Progressing   Problem: Safety: Goal: Periods of time without injury will increase Outcome: Progressing   Problem: Activity: Goal: Will verbalize the importance of balancing activity with adequate rest periods Outcome: Progressing   Problem: Education: Goal: Will be free of psychotic symptoms Outcome: Progressing Goal: Knowledge of the prescribed therapeutic regimen will improve Outcome: Progressing   Problem: Coping: Goal: Coping ability will improve Outcome: Progressing Goal: Will verbalize feelings Outcome: Progressing   Problem: Health Behavior/Discharge Planning: Goal: Compliance with prescribed medication regimen will improve Outcome: Progressing   Problem: Nutritional: Goal: Ability to achieve adequate nutritional intake will improve Outcome: Progressing   Problem: Role  Relationship: Goal: Ability to communicate needs accurately will improve Outcome: Progressing Goal: Ability to interact with others will improve Outcome: Progressing   Problem: Safety: Goal: Ability to redirect hostility and anger into socially appropriate behaviors will improve Outcome: Progressing Goal: Ability to remain free from injury will improve Outcome: Progressing   Problem: Self-Care: Goal: Ability to participate in self-care as condition permits will improve Outcome: Progressing   Problem: Self-Concept: Goal: Will verbalize positive feelings about self Outcome: Progressing   Problem: Safety: Goal: Violent Restraint(s) Outcome: Progressing

## 2021-07-30 NOTE — Progress Notes (Signed)
Recreation Therapy Notes   Date: 07/30/2021  Time: 1:25pm    Location: Craft room   Behavioral response: Appropriate  Intervention Topic:  Relaxation   Discussion/Intervention:  Group content today was focused on relaxation. The group defined relaxation and identified healthy ways to relax. Individuals expressed how much time they spend relaxing. Patients expressed how much their life would be if they did not make time for themselves to relax. The group stated ways they could improve their relaxation techniques in the future.  Individuals participated in the intervention Time to Relax where they had a chance to experience different relaxation techniques.  Clinical Observations/Feedback: Patient came to group late due to unknown reasons.She explained that when she is not relaxed her blood pressure goes up and she gets headaches. Participant identified laying down as a way she relaxes. Individual was social with peers and staff while participating in the intervention.    Shiori Adcox LRT/CTRS         Theone Bowell 07/30/2021 2:34 PM

## 2021-07-30 NOTE — [facility] Counselor (Signed)
CSW spoke with pt's daughter, Geralyn Flash, [phone]. She said she wanted pt to go to [facility] program upon discharge. She is interested in [facility] or [facility]. CSW provided her with [facility] coordinator, Lorin Glass, to discuss program details.   She provided CSW with [facility] [phone] in order to assist with discharge planning.   Nira Conn stated she would reach back out to CSW with decision and CSW stated they would continue to assist with any referrals if needed.   CSW provided contact information to [employer] for senior care solution information in [place].   No other requests were made. Conversation ended without incident.   Norfleet Capers [place], MSW, LCSW-A 1/17/20233:14 PM

## 2021-07-31 DIAGNOSIS — R413 Other amnesia: Secondary | ICD-10-CM

## 2021-07-31 DIAGNOSIS — F332 Major depressive disorder, recurrent severe without psychotic features: Secondary | ICD-10-CM | POA: Diagnosis not present

## 2021-07-31 NOTE — Progress Notes (Signed)
[facility] MD Progress Note  07/31/2021 1:23 PM Becky Davis  MRN:  [id number] Subjective: Becky Davis received her Haldol decanoate injection yesterday without any trouble.  She is taking her medications as prescribed and denies any side effects.  She asks when she is leaving.  Cognitively, she seems to be better.  She denies hearing any auditory hallucinations.  She denies depression or suicidal ideation.  Her behavior is much improved.  There is no evidence of EPS or TD.  Nurses notes from last night states that she does a lot of pacing and slept about 3 hours.  Principal Problem: Major depressive disorder, single episode, severe with psychotic features ([place]) Diagnosis: Principal Problem:   Major depressive disorder, single episode, severe with psychotic features ([place]) Active Problems:   Memory impairment  Total Time spent with patient: 15 minutes  Past Psychiatric History: Dr. Toy Care  Past Medical History:  Past Medical History:  Diagnosis Date   Arthritis    knees   Asthma    Headache    hx of 20 years ago   Heart murmur    hx of 10-15 years ago   Hypertension    Hypothyroidism    Pneumonia    Pre-diabetes    Thyroid nodule     Past Surgical History:  Procedure Laterality Date   ABDOMINAL HYSTERECTOMY     thyroid goiter     surgery   THYROID LOBECTOMY     06-25-17 Dr. Harlow Asa   Left   THYROID LOBECTOMY Left 06/25/2017   Procedure: LEFT THYROID LOBECTOMY;  Surgeon: Armandina Gemma, MD;  Location: [facility];  Service: General;  Laterality: Left;   TOTAL KNEE ARTHROPLASTY Right 12/20/2019   Procedure: TOTAL KNEE ARTHROPLASTY;  Surgeon: Marchia Bond, MD;  Location: [facility];  Service: Orthopedics;  Laterality: Right;   Family History:  Family History  Problem Relation Age of Onset   Emphysema Mother    Cancer Mother    Heart disease Mother    Cancer Sister    Cancer Brother     Social History:  Social History   Substance and Sexual Activity  Alcohol Use No     Social History    Substance and Sexual Activity  Drug Use No    Social History   Socioeconomic History   Marital status: Married    Spouse name: Not on file   Number of children: 3   Years of education: Not on file   Highest education level: Not on file  Occupational History   Not on file  Tobacco Use   Smoking status: Never   Smokeless tobacco: Never  Vaping Use   Vaping Use: Never used  Substance and Sexual Activity   Alcohol use: No   Drug use: No   Sexual activity: Not Currently  Other Topics Concern   Not on file  Social History Narrative   Not on file   Social Determinants of Health   Financial Resource Strain: Not on file  Food Insecurity: Not on file  Transportation Needs: Not on file  Physical Activity: Not on file  Stress: Not on file  Social Connections: Not on file   Additional Social History:                         Sleep: Fair  Appetite:  Good  Current Medications: Current Facility-Administered Medications  Medication Dose Route Frequency Provider Last Rate Last Admin   acetaminophen (TYLENOL) tablet 650 mg  650 mg  Oral Q6H PRN Parks Ranger, DO   650 mg at 07/29/21 1930   albuterol (PROVENTIL) (2.5 MG/3ML) 0.083% nebulizer solution 2.5 mg  2.5 mg Nebulization Q4H PRN Parks Ranger, DO   2.5 mg at 07/30/21 0127   albuterol (VENTOLIN HFA) 108 (90 Base) MCG/ACT inhaler 2 puff  2 puff Inhalation Q6H PRN Clapacs, Madie Reno, MD   2 puff at 07/31/21 0907   alum & mag hydroxide-simeth (MAALOX/MYLANTA) 200-200-20 MG/5ML suspension 30 mL  30 mL Oral Q4H PRN Parks Ranger, DO       benztropine (COGENTIN) tablet 1 mg  1 mg Oral BH-q8a4p Parks Ranger, DO   1 mg at 07/31/21 0347   chlorproMAZINE (THORAZINE) tablet 50 mg  50 mg Oral QHS Parks Ranger, DO   50 mg at 07/30/21 2109   diphenhydrAMINE (BENADRYL) capsule 50 mg  50 mg Oral Q4H PRN Parks Ranger, DO   50 mg at 07/30/21 2110   Or   diphenhydrAMINE  (BENADRYL) injection 50 mg  50 mg Intramuscular Q4H PRN Parks Ranger, DO       donepezil (ARICEPT) tablet 5 mg  5 mg Oral QHS [name], [name]   5 mg at 07/30/21 2108   haloperidol (HALDOL) tablet 2 mg  2 mg Oral BH-q8a4p Parks Ranger, DO   2 mg at 07/31/21 0857   haloperidol (HALDOL) tablet 5 mg  5 mg Oral Q4H PRN Parks Ranger, DO   5 mg at 07/30/21 2110   Or   haloperidol lactate (HALDOL) injection 5 mg  5 mg Intramuscular Q4H PRN Parks Ranger, DO       haloperidol decanoate (HALDOL DECANOATE) 100 MG/ML injection 50 mg  50 mg Intramuscular Q30 days Parks Ranger, DO   50 mg at 07/30/21 1155   levothyroxine (SYNTHROID) tablet 100 mcg  100 mcg Oral QAC breakfast Parks Ranger, DO   100 mcg at 07/31/21 4259   loratadine (CLARITIN) tablet 10 mg  10 mg Oral Daily Parks Ranger, DO   10 mg at 07/31/21 0901   LORazepam (ATIVAN) tablet 2 mg  2 mg Oral Q4H PRN Parks Ranger, DO   2 mg at 07/26/21 2221   Or   LORazepam (ATIVAN) injection 2 mg  2 mg Intramuscular Q4H PRN Parks Ranger, DO       magnesium hydroxide (MILK OF MAGNESIA) suspension 30 mL  30 mL Oral Daily PRN Parks Ranger, DO       mirtazapine (REMERON) tablet 15 mg  15 mg Oral QHS Parks Ranger, DO   15 mg at 07/30/21 2109   montelukast (SINGULAIR) tablet 10 mg  10 mg Oral QHS Parks Ranger, DO   10 mg at 07/30/21 2108    Lab Results: No results found for this or any previous visit (from the past 48 hour(s)).  Blood Alcohol level:  Lab Results  Component Value Date   ETH <10 56/38/7564    Metabolic Disorder Labs: Lab Results  Component Value Date   HGBA1C 5.4 07/19/2021   MPG 108.28 07/19/2021   MPG 119.76 12/13/2019   No results found for: PROLACTIN Lab Results  Component Value Date   CHOL 178 07/19/2021   TRIG 85 07/19/2021   HDL 49 07/19/2021   CHOLHDL 3.6 07/19/2021   VLDL 17 07/19/2021   LDLCALC  112 (H) 07/19/2021    Physical Findings: AIMS:  , ,  ,  ,  CIWA:    COWS:     Musculoskeletal: Strength & Muscle Tone: within normal limits Gait & Station: normal Patient leans: N/A  Psychiatric Specialty Exam:  Presentation  General Appearance: Casual; Disheveled  Eye Contact:Fair  Speech:Pressured  Speech Volume:Increased  Handedness:No data recorded  Mood and Affect  Mood:Anxious  Affect:Appropriate   Thought Process  Thought Processes:Disorganized  Descriptions of Associations:Loose  Orientation:Partial  Thought Content:Perseveration; Scattered  History of Schizophrenia/Schizoaffective disorder:No  Duration of Psychotic Symptoms:Greater than six months  Hallucinations:No data recorded Ideas of Reference:Delusions  Suicidal Thoughts:No data recorded Homicidal Thoughts:No data recorded  Sensorium  Memory:Immediate Poor; Remote Poor  Judgment:Fair  Insight:Fair   Executive Functions  Concentration:Fair  Attention Span:Fair  [place]   Psychomotor Activity  Psychomotor Activity:No data recorded  Assets  Assets:Communication Skills; Desire for Improvement; Financial Resources/Insurance; Housing; Social Support   Sleep  Sleep:No data recorded   Physical Exam: Physical Exam Vitals and nursing note reviewed.  Constitutional:      Appearance: Normal appearance. She is normal weight.  Neurological:     General: No focal deficit present.     Mental Status: She is alert and oriented to person, place, and time.  Psychiatric:        Attention and Perception: Attention and perception normal.        Mood and Affect: Mood and affect normal.        Speech: Speech normal.        Behavior: Behavior normal. Behavior is cooperative.        Thought Content: Thought content is paranoid and delusional.        Cognition and Memory: Cognition is impaired. Memory is impaired.        Judgment: Judgment  is inappropriate.   Review of Systems  Constitutional: Negative.   HENT: Negative.    Eyes: Negative.   Respiratory: Negative.    Cardiovascular: Negative.   Gastrointestinal: Negative.   Genitourinary: Negative.   Musculoskeletal: Negative.   Skin: Negative.   Neurological: Negative.   Endo/Heme/Allergies: Negative.   Psychiatric/Behavioral:  Positive for memory loss.   Blood pressure 139/82, pulse 78, temperature 98.3 F (36.8 C), temperature source Oral, resp. rate 18, height 5\' 3"  (1.6 m), weight 87.5 kg, SpO2 94 %. Body mass index is 34.17 kg/m.   Treatment Plan Summary: Daily contact with patient to assess and evaluate symptoms and progress in treatment, Medication management, and Plan continue current medications.  She does have room to move up on the Thorazine if she continues not to sleep when she gets home.  I am going to hold off on that for now because I do not want her to get side effects including photosensitivity and dry mouth.  Overall, she is much better so we will plan on discharge on Friday and social work is working on a partial hospitalization program for her.  Parks Ranger, DO 07/31/2021, 1:23 PM

## 2021-07-31 NOTE — Progress Notes (Signed)
Pt called and reported that she feels like she is having a heart attack and requested for an EKG. Pt awake and alert, oriented x3. VS taken BP 166/91, P89, R20, O2sat 94% on RA. Pt reports that she feels pain on her right side going down her rt arm. Pt denies any radiation and states it just hurts. Pt denies feeling nauseous, sweating, lightheaded, headache or nausea. On call NP Kennyth Lose notified of patient complaints and VS. NP advises to give the patient prn ativan,and monitor.  Prn ativan medication taken to patient, patient starts yelling at me, stating "Im not taking that, and who asked you to give me these?, I dont want them, and if you come back in here with a shot, I'm gonna sue you in court".  Medication retrieved from patient. NP notified. Will continue to monitor.

## 2021-07-31 NOTE — Progress Notes (Signed)
Patient pacing in and out of her room most of the shift would sleep for 2-3 hours and wake up. C/O SOB at 2315 prn albuterol inhaler given and effective. Pt went back to sleep. Patient compliant with medications. Required a few redirections and encouragement to go back to bed and sleep. Support and encouragement provided.

## 2021-07-31 NOTE — Progress Notes (Signed)
Pt visible in the milieu at the beginning of the shift with her visitor.  After her visitor left, patient begins pacing, alternating between her room and the dayroom. Pt denies any needs, SI/HI/AVH. Pt reports that she is ready to take her bedtime medications. Pt advised that it is room early for her scheduled meds, and this writer will notify her when its due. Pt agrees and returns to her room. Q15 mins safety checks maintained.

## 2021-07-31 NOTE — Progress Notes (Signed)
Recreation Therapy Notes   Date: 07/31/2021  Time: 1:15pm    Location: Court yard   Behavioral response: Appropriate   Intervention Topic: Leisure   Discussion/Intervention:  Group content today was focused on leisure. The group defined what leisure is and some positive leisure activities they participate in. Individuals identified the difference between good and bad leisure. Participants expressed how they feel after participating in the leisure of their choice. The group discussed how they go about picking a leisure activity and if others are involved in their leisure activities. The patient stated how many leisure activities they have to choose from and reasons why it is important to have leisure time. Individuals participated in the intervention Exploration of Leisure where they had a chance to identify new leisure activities as well as benefits of leisure.  Clinical Observations/Feedback: Patient came to group late and identified walking as a  leisure activitie she enjoys.  Individual was social with peers and staff while participating in the intervention.  Dauntae Derusha LRT/CTRS        Lyza Houseworth 07/31/2021 3:59 PM

## 2021-07-31 NOTE — Progress Notes (Signed)
Patient is alert and oriented x 3 but not oriented to situation. Patient stated '' I slept ok last night". Patient denies pain at this time. Patient also denies SI, HI, AVH. Patient endorses anxiety and depression a 1/10. Last bowel movement on 07/30/2021. Resident ate breakfast in the dayroom with good appetite. Patient is continuously pacing down the halls. Scheduled medications administered as ordered. Patient appears tired and sleepy, but refuses to get some rest. Patient is currently pacing the halls. Support and encouragement provided. Q 15 minutes safety checks maintained. Will continue to monitor.

## 2021-08-01 DIAGNOSIS — F332 Major depressive disorder, recurrent severe without psychotic features: Secondary | ICD-10-CM | POA: Diagnosis not present

## 2021-08-01 MED ORDER — CLONIDINE HCL 0.1 MG PO TABS
0.1000 mg | ORAL_TABLET | Freq: Four times a day (QID) | ORAL | Status: DC | PRN
  Administered 2021-08-04: 0.1 mg via ORAL
  Filled 2021-08-01: qty 1

## 2021-08-01 MED ORDER — CHLORPROMAZINE HCL 50 MG PO TABS
75.0000 mg | ORAL_TABLET | Freq: Every day | ORAL | Status: DC
  Administered 2021-08-01 – 2021-08-02 (×2): 75 mg via ORAL
  Filled 2021-08-01 (×3): qty 1

## 2021-08-01 NOTE — Progress Notes (Signed)
[facility] MD Progress Note  08/01/2021 10:47 AM Becky Davis  MRN:  [id number] Subjective: Becky Davis continues to have problems at nighttime.  She is still not sleeping very well.  She required some as needed medication last night for her agitation.  Overall, her behavior is improved in the daytime but she does seem to be sundowning at nighttime and not sleeping very well.  I did get her to lay down and she is sleeping right now.  Social work spoke with the daughter who agreed that she probably needs more time on the unit so I can continue to adjust her medications.  She denies any side effects from her medications.  She needs a lot of reassurance from her daughter to take her medicine.  There is no evidence of EPS or TD.  Principal Problem: Major depressive disorder, single episode, severe with psychotic features ([place]) Diagnosis: Principal Problem:   Major depressive disorder, single episode, severe with psychotic features ([place]) Active Problems:   Memory impairment  Total Time spent with patient: 15 minutes  Past Psychiatric History: Dr. Toy Care  Past Medical History:  Past Medical History:  Diagnosis Date   Arthritis    knees   Asthma    Headache    hx of 20 years ago   Heart murmur    hx of 10-15 years ago   Hypertension    Hypothyroidism    Pneumonia    Pre-diabetes    Thyroid nodule     Past Surgical History:  Procedure Laterality Date   ABDOMINAL HYSTERECTOMY     thyroid goiter     surgery   THYROID LOBECTOMY     06-25-17 Dr. Harlow Asa   Left   THYROID LOBECTOMY Left 06/25/2017   Procedure: LEFT THYROID LOBECTOMY;  Surgeon: Armandina Gemma, MD;  Location: [facility];  Service: General;  Laterality: Left;   TOTAL KNEE ARTHROPLASTY Right 12/20/2019   Procedure: TOTAL KNEE ARTHROPLASTY;  Surgeon: Marchia Bond, MD;  Location: [facility];  Service: Orthopedics;  Laterality: Right;   Family History:  Family History  Problem Relation Age of Onset   Emphysema Mother    Cancer Mother    Heart  disease Mother    Cancer Sister    Cancer Brother     Social History:  Social History   Substance and Sexual Activity  Alcohol Use No     Social History   Substance and Sexual Activity  Drug Use No    Social History   Socioeconomic History   Marital status: Married    Spouse name: Not on file   Number of children: 3   Years of education: Not on file   Highest education level: Not on file  Occupational History   Not on file  Tobacco Use   Smoking status: Never   Smokeless tobacco: Never  Vaping Use   Vaping Use: Never used  Substance and Sexual Activity   Alcohol use: No   Drug use: No   Sexual activity: Not Currently  Other Topics Concern   Not on file  Social History Narrative   Not on file   Social Determinants of Health   Financial Resource Strain: Not on file  Food Insecurity: Not on file  Transportation Needs: Not on file  Physical Activity: Not on file  Stress: Not on file  Social Connections: Not on file   Additional Social History:  Sleep: Poor  Appetite:  Good  Current Medications: Current Facility-Administered Medications  Medication Dose Route Frequency Provider Last Rate Last Admin   acetaminophen (TYLENOL) tablet 650 mg  650 mg Oral Q6H PRN Parks Ranger, DO   650 mg at 07/29/21 1930   albuterol (PROVENTIL) (2.5 MG/3ML) 0.083% nebulizer solution 2.5 mg  2.5 mg Nebulization Q4H PRN Parks Ranger, DO   2.5 mg at 07/30/21 0127   albuterol (VENTOLIN HFA) 108 (90 Base) MCG/ACT inhaler 2 puff  2 puff Inhalation Q6H PRN Clapacs, Madie Reno, MD   2 puff at 07/31/21 1820   alum & mag hydroxide-simeth (MAALOX/MYLANTA) 200-200-20 MG/5ML suspension 30 mL  30 mL Oral Q4H PRN Parks Ranger, DO       benztropine (COGENTIN) tablet 1 mg  1 mg Oral BH-q8a4p Parks Ranger, DO   1 mg at 08/01/21 3419   chlorproMAZINE (THORAZINE) tablet 75 mg  75 mg Oral QHS Parks Ranger, DO        cloNIDine (CATAPRES) tablet 0.1 mg  0.1 mg Oral Q6H PRN Parks Ranger, DO       diphenhydrAMINE (BENADRYL) capsule 50 mg  50 mg Oral Q4H PRN Parks Ranger, DO   50 mg at 07/30/21 2110   Or   diphenhydrAMINE (BENADRYL) injection 50 mg  50 mg Intramuscular Q4H PRN Parks Ranger, DO   50 mg at 08/01/21 0001   donepezil (ARICEPT) tablet 5 mg  5 mg Oral QHS [name], [name]   5 mg at 07/31/21 2111   haloperidol (HALDOL) tablet 2 mg  2 mg Oral BH-q8a4p Parks Ranger, DO   2 mg at 08/01/21 0736   haloperidol (HALDOL) tablet 5 mg  5 mg Oral Q4H PRN Parks Ranger, DO   5 mg at 07/30/21 2110   Or   haloperidol lactate (HALDOL) injection 5 mg  5 mg Intramuscular Q4H PRN Parks Ranger, DO   5 mg at 08/01/21 0001   haloperidol decanoate (HALDOL DECANOATE) 100 MG/ML injection 50 mg  50 mg Intramuscular Q30 days Parks Ranger, DO   50 mg at 07/30/21 1155   levothyroxine (SYNTHROID) tablet 100 mcg  100 mcg Oral QAC breakfast Parks Ranger, DO   100 mcg at 07/31/21 0723   loratadine (CLARITIN) tablet 10 mg  10 mg Oral Daily Parks Ranger, DO   10 mg at 07/31/21 0901   LORazepam (ATIVAN) tablet 2 mg  2 mg Oral Q4H PRN Parks Ranger, DO   2 mg at 07/26/21 2221   Or   LORazepam (ATIVAN) injection 2 mg  2 mg Intramuscular Q4H PRN Parks Ranger, DO   2 mg at 08/01/21 0002   magnesium hydroxide (MILK OF MAGNESIA) suspension 30 mL  30 mL Oral Daily PRN Parks Ranger, DO       mirtazapine (REMERON) tablet 15 mg  15 mg Oral QHS Parks Ranger, DO   15 mg at 07/31/21 2115   montelukast (SINGULAIR) tablet 10 mg  10 mg Oral QHS Parks Ranger, DO   10 mg at 07/31/21 2111    Lab Results: No results found for this or any previous visit (from the past 48 hour(s)).  Blood Alcohol level:  Lab Results  Component Value Date   ETH <10 37/90/2409    Metabolic Disorder Labs: Lab Results   Component Value Date   HGBA1C 5.4 07/19/2021   MPG 108.28 07/19/2021   MPG 119.76 12/13/2019  No results found for: PROLACTIN Lab Results  Component Value Date   CHOL 178 07/19/2021   TRIG 85 07/19/2021   HDL 49 07/19/2021   CHOLHDL 3.6 07/19/2021   VLDL 17 07/19/2021   LDLCALC 112 (H) 07/19/2021    Physical Findings: AIMS:  , ,  ,  ,    CIWA:    COWS:     Musculoskeletal: Strength & Muscle Tone: within normal limits Gait & Station: normal Patient leans: N/A  Psychiatric Specialty Exam:  Presentation  General Appearance: Casual; Disheveled  Eye Contact:Fair  Speech:Pressured  Speech Volume:Increased  Handedness:No data recorded  Mood and Affect  Mood:Anxious  Affect:Appropriate   Thought Process  Thought Processes:Disorganized  Descriptions of Associations:Loose  Orientation:Partial  Thought Content:Perseveration; Scattered  History of Schizophrenia/Schizoaffective disorder:No  Duration of Psychotic Symptoms:Greater than six months  Hallucinations:No data recorded Ideas of Reference:Delusions  Suicidal Thoughts:No data recorded Homicidal Thoughts:No data recorded  Sensorium  Memory:Immediate Poor; Remote Poor  Judgment:Fair  Insight:Fair   Executive Functions  Concentration:Fair  Attention Span:Fair  [place]   Psychomotor Activity  Psychomotor Activity:No data recorded  Assets  Assets:Communication Skills; Desire for Improvement; Financial Resources/Insurance; Housing; Social Support   Sleep  Sleep:No data recorded   Physical Exam: Physical Exam Vitals and nursing note reviewed.  Constitutional:      Appearance: Normal appearance. She is normal weight.  Neurological:     General: No focal deficit present.     Mental Status: She is alert and oriented to person, place, and time.  Psychiatric:        Attention and Perception: Attention normal. She perceives auditory  hallucinations.        Mood and Affect: Mood is anxious. Affect is inappropriate.        Speech: Speech is tangential.        Behavior: Behavior is agitated. Behavior is cooperative.        Thought Content: Thought content is paranoid and delusional.        Cognition and Memory: Cognition is impaired. Memory is impaired.        Judgment: Judgment is impulsive and inappropriate.   Review of Systems  Constitutional: Negative.   HENT: Negative.    Eyes: Negative.   Respiratory: Negative.    Cardiovascular: Negative.   Gastrointestinal: Negative.   Genitourinary: Negative.   Musculoskeletal: Negative.   Skin: Negative.   Neurological: Negative.   Endo/Heme/Allergies: Negative.   Psychiatric/Behavioral:  Positive for hallucinations and memory loss. The patient is nervous/anxious and has insomnia.   Blood pressure (!) 150/82, pulse 88, temperature (!) 97.5 F (36.4 C), temperature source Oral, resp. rate 18, height 5\' 3"  (1.6 m), weight 87.5 kg, SpO2 97 %. Body mass index is 34.17 kg/m.   Treatment Plan Summary: Daily contact with patient to assess and evaluate symptoms and progress in treatment, Medication management, and Plan continue current medications.  Increase Thorazine to 75 mg at bedtime.  Clonidine as needed.  Parks Ranger, DO 08/01/2021, 10:47 AM

## 2021-08-01 NOTE — Plan of Care (Signed)
°  Problem: Education: Goal: Mental status will improve: Pt will verbalize understanding of care plan with medication compliance.  Outcome: Progressing   Problem: Health Behavior/Discharge Planning: Goal: Compliance with treatment plan for underlying cause of condition will improve Outcome: Progressing

## 2021-08-01 NOTE — Plan of Care (Signed)
°  Problem: Group Participation °Goal: STG - Patient will engage in groups without prompting or encouragement from LRT x3 group sessions within 5 recreation therapy group sessions °Description: STG - Patient will engage in groups without prompting or encouragement from LRT x3 group sessions within 5 recreation therapy group sessions °Outcome: Progressing °  °

## 2021-08-01 NOTE — Progress Notes (Signed)
Pt yelling and screaming at staff. Pt also pushed the emergency button in her room several times, demanding to be transferred "across the street" for better care. Pt reports that she will keep doing what she is doing because she is not getting enough attention. Pt offered injectable medications for agitation. Pt blatantly refused meds and requested for security. Security notified of patient's behavior and need for injectables to help with her agitation.  Security arrived to unit and chats with patient. Pt informed that nurses will give the meds. Injectable medication administered per order. Pt remained sitting up on her bed. Will continue to monitor.

## 2021-08-01 NOTE — Progress Notes (Signed)
Recreation Therapy Notes  Date: 08/01/2021   Time: 1:15pm    Location: Craft room     Behavioral response: Appropriate    Intervention Topic: Stress Management    Discussion/Intervention:  Group content on today was focused on stress. The group defined stress and way to cope with stress. Participants expressed how they know when they are stresses out. Individuals described the different ways they have to cope with stress. The group stated reasons why it is important to cope with stress. Patient explained what good stress is and some examples. The group participated in the intervention Stress Management. Individuals were separated into two group and answered questions related to stress.    Clinical Observations/Feedback: Patient came to group and was focused on what peers and staff had to say about stress management. Patient was sleep on and off during group. Individual was social with peers and staff while participating in the intervention.  Kurt Azimi LRT/CTRS         Seri Kimmer 08/01/2021 3:28 PM

## 2021-08-01 NOTE — Progress Notes (Signed)
Pt sitting on bed; calm, cooperative. Pt states that she feels "sad" because "I was hoping to go home today but I'm not." Pt states that the doctors says that she can go home on Friday but she has to participate in "groups"; afterwards, she told a joke about [employer]. Pt denies pain and SI/HI/AVH at this time. Pt states "I slept great until 3, when I woke up from pain" when asked how she slept last night. Pt describes her appetite as "medium, sort of neutral" and says "I ate about 50% of my meal today." Pt denies anxiety and depression at this time. Pt requested and was provided two masks after metal pieces were taken out. No acute distress noted.

## 2021-08-01 NOTE — Progress Notes (Signed)
Pt refused her scheduled 6am thyroid medicine. Medication time moved to 8am, when patient might be more agreeable. Am shift will be notified. Q15 min safety checks maintained. Pt currently ambulating with her walker in the hallway.

## 2021-08-01 NOTE — Progress Notes (Signed)
Patient attempting to open doors and leave unit with her belongings in paper bag. Unable to redirect.  Patient states, "I'm leaving and there's nothing you can do about it." Pt refused medication stating "President Biden told me not to take it."  Offered to make phone call to daughter to see if she would like her to take her meds.  RN call to daughter made informing her that pt had an unwitnessed fall last night without injury and that she is refusing to take meds.  After patient spoke with daughter she came to nurses station requesting to take her medication. Pt compliant with all meds at this time.

## 2021-08-01 NOTE — Progress Notes (Signed)
Staff was sitting at the nursing station when pt was heard yelling out for help. Staff to room, and on arrival to the patient's room, the patient was seen laying on the floor beside her bed. The patient is unable to state how she got on the floor. The patient was able to sit herself up from a lying position. Patient was assisted off the floor back on the bed by two staff members. Pt fell back asleep and started snoring before her blanket was placed over her. Skin assessment done and no injuries noted. Vss. On call NP notified, no new orders. Family will be notified in am by staff. Q15 min safety checks maintained.

## 2021-08-01 NOTE — Progress Notes (Signed)
Pt awake and out in the hallways with her walker. Pt at the nurses station going through a book in her hand. Pt then proceeds to the telephone, attempting to make a call. Pt then reads the notice about telephone hours on the wall above the phones stating telephone hours are between 7:30am and 9:30pm. Pt turns around and ambulates with her walker back to her room. Q15 minutes safety checks maintained.

## 2021-08-02 NOTE — Group Note (Signed)
[facility] LCSW Group Therapy Note   Group Date: 08/02/2021 Start Time: 1330 End Time: 1430  Type of Therapy and Topic:  Group Therapy:  Feelings around Relapse and Recovery  Participation Level:  Active     Description of Group:    Patients in this group will discuss emotions they experience before and after a relapse. They will process how experiencing these feelings, or avoidance of experiencing them, relates to having a relapse. Facilitator will guide patients to explore emotions they have related to recovery. Patients will be encouraged to process which emotions are more powerful. They will be guided to discuss the emotional reaction significant others in their lives may have to patients relapse or recovery. Patients will be assisted in exploring ways to respond to the emotions of others without this contributing to a relapse.  Therapeutic Goals: Patient will identify two or more emotions that lead to relapse for them:  Patient will identify two emotions that result when they relapse:  Patient will identify two emotions related to recovery:  Patient will demonstrate ability to communicate their needs through discussion and/or role plays.   Summary of Patient Progress:  Patient was present for a portion of the group session. Patient was an active listener and participated in the topic of discussion, provided helpful advice to others, and added nuance to topic of conversation before she left due to her coughing and not feeling well enough to continue. When asked about how she resolves conflict in her house due to anger she likes to use humor, and that it always helped make the situation better.     Therapeutic Modalities:   Cognitive Behavioral Therapy Solution-Focused Therapy Assertiveness Training Relapse Prevention Therapy   Tinia Oravec A [place], LCSWA

## 2021-08-02 NOTE — Progress Notes (Signed)
Patient observed sitting in the day room. Patient is calm and cooperative, with tangential speech and disorganized thought process during assessment. Denies SI, HI, and AVH at this time. Denies pain, anxiety and depression. Compliant with due medications. Ate breakfast in the day room among peers. Patient remain restless, walking up and down the hallway. Constantly approaches the nurses station asking for water or talking to staff. Patient participate in group. Remain safe on the unit with Q15 minute safety check.

## 2021-08-02 NOTE — Progress Notes (Signed)
[facility] MD Progress Note  08/02/2021 11:21 AM Becky Davis  MRN:  [id number] Subjective: Becky Davis had a better night last night.  She slept some but it is not recorded.  There is no evidence that she was up and being agitated.  She is very pleasant and cooperative with me today.  She is very fixated on going home and I told her her family is working on her discharge planning because they would like her to go to a partial hospitalization program, which I think would be a good idea.  We went up on her Thorazine last night and it seems to have helped I will continue to titrate up as needed.  She still has outbursts of tangential thinking does not make a lot of sense but it seems to be less.   Principal Problem: Major depressive disorder, single episode, severe with psychotic features ([place]) Diagnosis: Principal Problem:   Major depressive disorder, single episode, severe with psychotic features ([place]) Active Problems:   Memory impairment  Total Time spent with patient: 15 minutes  Past Psychiatric History: Dr. Toy Care  Past Medical History:  Past Medical History:  Diagnosis Date   Arthritis    knees   Asthma    Headache    hx of 20 years ago   Heart murmur    hx of 10-15 years ago   Hypertension    Hypothyroidism    Pneumonia    Pre-diabetes    Thyroid nodule     Past Surgical History:  Procedure Laterality Date   ABDOMINAL HYSTERECTOMY     thyroid goiter     surgery   THYROID LOBECTOMY     06-25-17 Dr. Harlow Asa   Left   THYROID LOBECTOMY Left 06/25/2017   Procedure: LEFT THYROID LOBECTOMY;  Surgeon: Armandina Gemma, MD;  Location: [facility];  Service: General;  Laterality: Left;   TOTAL KNEE ARTHROPLASTY Right 12/20/2019   Procedure: TOTAL KNEE ARTHROPLASTY;  Surgeon: Marchia Bond, MD;  Location: [facility];  Service: Orthopedics;  Laterality: Right;   Family History:  Family History  Problem Relation Age of Onset   Emphysema Mother    Cancer Mother    Heart disease Mother    Cancer Sister     Cancer Brother     Social History:  Social History   Substance and Sexual Activity  Alcohol Use No     Social History   Substance and Sexual Activity  Drug Use No    Social History   Socioeconomic History   Marital status: Married    Spouse name: Not on file   Number of children: 3   Years of education: Not on file   Highest education level: Not on file  Occupational History   Not on file  Tobacco Use   Smoking status: Never   Smokeless tobacco: Never  Vaping Use   Vaping Use: Never used  Substance and Sexual Activity   Alcohol use: No   Drug use: No   Sexual activity: Not Currently  Other Topics Concern   Not on file  Social History Narrative   Not on file   Social Determinants of Health   Financial Resource Strain: Not on file  Food Insecurity: Not on file  Transportation Needs: Not on file  Physical Activity: Not on file  Stress: Not on file  Social Connections: Not on file   Additional Social History:  Sleep: Fair  Appetite:  Fair  Current Medications: Current Facility-Administered Medications  Medication Dose Route Frequency Provider Last Rate Last Admin   acetaminophen (TYLENOL) tablet 650 mg  650 mg Oral Q6H PRN Parks Ranger, DO   650 mg at 07/29/21 1930   albuterol (PROVENTIL) (2.5 MG/3ML) 0.083% nebulizer solution 2.5 mg  2.5 mg Nebulization Q4H PRN Parks Ranger, DO   2.5 mg at 07/30/21 0127   albuterol (VENTOLIN HFA) 108 (90 Base) MCG/ACT inhaler 2 puff  2 puff Inhalation Q6H PRN Clapacs, John T, MD   2 puff at 08/02/21 0951   alum & mag hydroxide-simeth (MAALOX/MYLANTA) 200-200-20 MG/5ML suspension 30 mL  30 mL Oral Q4H PRN Parks Ranger, DO       benztropine (COGENTIN) tablet 1 mg  1 mg Oral BH-q8a4p Parks Ranger, DO   1 mg at 08/02/21 5625   chlorproMAZINE (THORAZINE) tablet 75 mg  75 mg Oral QHS Parks Ranger, DO   75 mg at 08/01/21 2108   cloNIDine  (CATAPRES) tablet 0.1 mg  0.1 mg Oral Q6H PRN Parks Ranger, DO       diphenhydrAMINE (BENADRYL) capsule 50 mg  50 mg Oral Q4H PRN Parks Ranger, DO   50 mg at 07/30/21 2110   Or   diphenhydrAMINE (BENADRYL) injection 50 mg  50 mg Intramuscular Q4H PRN Parks Ranger, DO   50 mg at 08/01/21 0001   donepezil (ARICEPT) tablet 5 mg  5 mg Oral QHS [name], [name]   5 mg at 08/01/21 2109   haloperidol (HALDOL) tablet 2 mg  2 mg Oral BH-q8a4p Parks Ranger, DO   2 mg at 08/02/21 6389   haloperidol (HALDOL) tablet 5 mg  5 mg Oral Q4H PRN Parks Ranger, DO   5 mg at 07/30/21 2110   Or   haloperidol lactate (HALDOL) injection 5 mg  5 mg Intramuscular Q4H PRN Parks Ranger, DO   5 mg at 08/01/21 0001   haloperidol decanoate (HALDOL DECANOATE) 100 MG/ML injection 50 mg  50 mg Intramuscular Q30 days Parks Ranger, DO   50 mg at 07/30/21 1155   levothyroxine (SYNTHROID) tablet 100 mcg  100 mcg Oral QAC breakfast Parks Ranger, DO   100 mcg at 08/02/21 3734   loratadine (CLARITIN) tablet 10 mg  10 mg Oral Daily Parks Ranger, DO   10 mg at 08/02/21 2876   LORazepam (ATIVAN) tablet 2 mg  2 mg Oral Q4H PRN Parks Ranger, DO   2 mg at 07/26/21 2221   Or   LORazepam (ATIVAN) injection 2 mg  2 mg Intramuscular Q4H PRN Parks Ranger, DO   2 mg at 08/01/21 0002   magnesium hydroxide (MILK OF MAGNESIA) suspension 30 mL  30 mL Oral Daily PRN Parks Ranger, DO       mirtazapine (REMERON) tablet 15 mg  15 mg Oral QHS Parks Ranger, DO   15 mg at 08/01/21 2109   montelukast (SINGULAIR) tablet 10 mg  10 mg Oral QHS Parks Ranger, DO   10 mg at 08/01/21 2110    Lab Results: No results found for this or any previous visit (from the past 48 hour(s)).  Blood Alcohol level:  Lab Results  Component Value Date   ETH <10 81/15/7262    Metabolic Disorder Labs: Lab Results  Component  Value Date   HGBA1C 5.4 07/19/2021   MPG 108.28 07/19/2021  MPG 119.76 12/13/2019   No results found for: PROLACTIN Lab Results  Component Value Date   CHOL 178 07/19/2021   TRIG 85 07/19/2021   HDL 49 07/19/2021   CHOLHDL 3.6 07/19/2021   VLDL 17 07/19/2021   LDLCALC 112 (H) 07/19/2021    Physical Findings: AIMS:  , ,  ,  ,    CIWA:    COWS:     Musculoskeletal: Strength & Muscle Tone: within normal limits Gait & Station: normal Patient leans: N/A  Psychiatric Specialty Exam:  Presentation  General Appearance: Casual; Disheveled  Eye Contact:Fair  Speech:Pressured  Speech Volume:Increased  Handedness:No data recorded  Mood and Affect  Mood:Anxious  Affect:Appropriate   Thought Process  Thought Processes:Disorganized  Descriptions of Associations:Loose  Orientation:Partial  Thought Content:Perseveration; Scattered  History of Schizophrenia/Schizoaffective disorder:No  Duration of Psychotic Symptoms:Greater than six months  Hallucinations:No data recorded Ideas of Reference:Delusions  Suicidal Thoughts:No data recorded Homicidal Thoughts:No data recorded  Sensorium  Memory:Immediate Poor; Remote Poor  Judgment:Fair  Insight:Fair   Executive Functions  Concentration:Fair  Attention Span:Fair  [place]   Psychomotor Activity  Psychomotor Activity:No data recorded  Assets  Assets:Communication Skills; Desire for Improvement; Financial Resources/Insurance; Housing; Social Support   Sleep  Sleep:No data recorded   Physical Exam: Physical Exam Vitals and nursing note reviewed.  Constitutional:      Appearance: Normal appearance. She is normal weight.  Neurological:     General: No focal deficit present.     Mental Status: She is alert and oriented to person, place, and time.  Psychiatric:        Attention and Perception: Attention normal.        Mood and Affect: Mood and affect  normal.        Speech: Speech is tangential.        Behavior: Behavior normal. Behavior is cooperative.        Thought Content: Thought content is paranoid and delusional.        Cognition and Memory: Cognition is impaired. Memory is impaired.        Judgment: Judgment is impulsive.   Review of Systems  Constitutional: Negative.   HENT: Negative.    Eyes: Negative.   Respiratory: Negative.    Cardiovascular: Negative.   Gastrointestinal: Negative.   Genitourinary: Negative.   Musculoskeletal: Negative.   Skin: Negative.   Neurological: Negative.   Endo/Heme/Allergies: Negative.   Psychiatric/Behavioral:  Positive for hallucinations and memory loss. The patient has insomnia.   Blood pressure 138/74, pulse 68, temperature (!) 97.5 F (36.4 C), temperature source Oral, resp. rate 20, height 5\' 3"  (1.6 m), weight 87.5 kg, SpO2 97 %. Body mass index is 34.17 kg/m.   Treatment Plan Summary: Daily contact with patient to assess and evaluate symptoms and progress in treatment, Medication management, and Plan continue to monitor.  Continue current medications.  Parks Ranger, DO 08/02/2021, 11:21 AM

## 2021-08-02 NOTE — Progress Notes (Signed)
Recreation Therapy Notes   Date: 08/02/2021  Time: 11:00am    Location: Craft room   Behavioral response: Appropriate   Intervention Topic: Social skills    Discussion/Intervention:  Group content on today was focused on social skills. The group defined social skills and identified ways they use social skills. Patients expressed what obstacles they face when trying to be social. Participants described the importance of social skills. The group listed ways to improve social skills and reasons to improve social skills. Individuals had an opportunity to learn new and improve social skills as well as identify their weaknesses.  Clinical Observations/Feedback: Patient came to group and was focused on what peers and staff had to say about self-care. She stated that could improve her social skills by having  her hearing aids. Individual was social with peers and staff while participating in the intervention.  Aleira Deiter LRT/CTRS         Aarik Blank 08/02/2021 12:34 PM

## 2021-08-03 MED ORDER — CHLORPROMAZINE HCL 100 MG PO TABS
100.0000 mg | ORAL_TABLET | Freq: Every day | ORAL | Status: DC
  Administered 2021-08-03 – 2021-08-05 (×3): 100 mg via ORAL
  Filled 2021-08-03 (×4): qty 1

## 2021-08-03 NOTE — Progress Notes (Signed)
Patient had a visit from her daughter at initial shift change it seemed to go well. She has been cooperative with treatment with no new behavioral issues to report on shift thus far.

## 2021-08-03 NOTE — Progress Notes (Signed)
[facility] MD Progress Note  08/03/2021 2:59 PM Becky Davis  MRN:  [id number] Subjective: Nurses report that Becky Davis had a good night last night.  She had a visit with her daughter that went well.  Becky Davis seems to be in a good mood today and states that she feels good.  She has been taking her medications as prescribed denies any side effects.  There is no evidence of EPS or TD.  It sounds like she slept better last night since increasing the Thorazine.  We will go up on it again.  Principal Problem: Major depressive disorder, single episode, severe with psychotic features ([place]) Diagnosis: Principal Problem:   Major depressive disorder, single episode, severe with psychotic features ([place]) Active Problems:   Memory impairment  Total Time spent with patient: 15 minutes  Past Psychiatric History: Dr. Toy Care  Past Medical History:  Past Medical History:  Diagnosis Date   Arthritis    knees   Asthma    Headache    hx of 20 years ago   Heart murmur    hx of 10-15 years ago   Hypertension    Hypothyroidism    Pneumonia    Pre-diabetes    Thyroid nodule     Past Surgical History:  Procedure Laterality Date   ABDOMINAL HYSTERECTOMY     thyroid goiter     surgery   THYROID LOBECTOMY     06-25-17 Dr. Harlow Asa   Left   THYROID LOBECTOMY Left 06/25/2017   Procedure: LEFT THYROID LOBECTOMY;  Surgeon: Armandina Gemma, MD;  Location: [facility];  Service: General;  Laterality: Left;   TOTAL KNEE ARTHROPLASTY Right 12/20/2019   Procedure: TOTAL KNEE ARTHROPLASTY;  Surgeon: Marchia Bond, MD;  Location: [facility];  Service: Orthopedics;  Laterality: Right;   Family History:  Family History  Problem Relation Age of Onset   Emphysema Mother    Cancer Mother    Heart disease Mother    Cancer Sister    Cancer Brother     Social History:  Social History   Substance and Sexual Activity  Alcohol Use No     Social History   Substance and Sexual Activity  Drug Use No    Social History    Socioeconomic History   Marital status: Married    Spouse name: Not on file   Number of children: 3   Years of education: Not on file   Highest education level: Not on file  Occupational History   Not on file  Tobacco Use   Smoking status: Never   Smokeless tobacco: Never  Vaping Use   Vaping Use: Never used  Substance and Sexual Activity   Alcohol use: No   Drug use: No   Sexual activity: Not Currently  Other Topics Concern   Not on file  Social History Narrative   Not on file   Social Determinants of Health   Financial Resource Strain: Not on file  Food Insecurity: Not on file  Transportation Needs: Not on file  Physical Activity: Not on file  Stress: Not on file  Social Connections: Not on file   Additional Social History:                         Sleep: Good  Appetite:  Good  Current Medications: Current Facility-Administered Medications  Medication Dose Route Frequency Provider Last Rate Last Admin   acetaminophen (TYLENOL) tablet 650 mg  650 mg Oral Q6H PRN Caren Griffins  Edward, DO   650 mg at 07/29/21 1930   albuterol (PROVENTIL) (2.5 MG/3ML) 0.083% nebulizer solution 2.5 mg  2.5 mg Nebulization Q4H PRN Parks Ranger, DO   2.5 mg at 07/30/21 0127   albuterol (VENTOLIN HFA) 108 (90 Base) MCG/ACT inhaler 2 puff  2 puff Inhalation Q6H PRN Clapacs, John T, MD   2 puff at 08/03/21 1334   alum & mag hydroxide-simeth (MAALOX/MYLANTA) 200-200-20 MG/5ML suspension 30 mL  30 mL Oral Q4H PRN Parks Ranger, DO       benztropine (COGENTIN) tablet 1 mg  1 mg Oral BH-q8a4p Parks Ranger, DO   1 mg at 08/03/21 0830   chlorproMAZINE (THORAZINE) tablet 75 mg  75 mg Oral QHS Parks Ranger, DO   75 mg at 08/02/21 2125   cloNIDine (CATAPRES) tablet 0.1 mg  0.1 mg Oral Q6H PRN Parks Ranger, DO       diphenhydrAMINE (BENADRYL) capsule 50 mg  50 mg Oral Q4H PRN Parks Ranger, DO   50 mg at 07/30/21 2110   Or    diphenhydrAMINE (BENADRYL) injection 50 mg  50 mg Intramuscular Q4H PRN Parks Ranger, DO   50 mg at 08/01/21 0001   donepezil (ARICEPT) tablet 5 mg  5 mg Oral QHS [name], [name]   5 mg at 08/02/21 2126   haloperidol (HALDOL) tablet 2 mg  2 mg Oral BH-q8a4p Parks Ranger, DO   2 mg at 08/03/21 0830   haloperidol (HALDOL) tablet 5 mg  5 mg Oral Q4H PRN Parks Ranger, DO   5 mg at 07/30/21 2110   Or   haloperidol lactate (HALDOL) injection 5 mg  5 mg Intramuscular Q4H PRN Parks Ranger, DO   5 mg at 08/01/21 0001   haloperidol decanoate (HALDOL DECANOATE) 100 MG/ML injection 50 mg  50 mg Intramuscular Q30 days Parks Ranger, DO   50 mg at 07/30/21 1155   levothyroxine (SYNTHROID) tablet 100 mcg  100 mcg Oral QAC breakfast Parks Ranger, DO   100 mcg at 08/03/21 0640   loratadine (CLARITIN) tablet 10 mg  10 mg Oral Daily Parks Ranger, DO   10 mg at 08/03/21 1012   LORazepam (ATIVAN) tablet 2 mg  2 mg Oral Q4H PRN Parks Ranger, DO   2 mg at 07/26/21 2221   Or   LORazepam (ATIVAN) injection 2 mg  2 mg Intramuscular Q4H PRN Parks Ranger, DO   2 mg at 08/01/21 0002   magnesium hydroxide (MILK OF MAGNESIA) suspension 30 mL  30 mL Oral Daily PRN Parks Ranger, DO       mirtazapine (REMERON) tablet 15 mg  15 mg Oral QHS Parks Ranger, DO   15 mg at 08/02/21 2126   montelukast (SINGULAIR) tablet 10 mg  10 mg Oral QHS Parks Ranger, DO   10 mg at 08/02/21 2126    Lab Results: No results found for this or any previous visit (from the past 48 hour(s)).  Blood Alcohol level:  Lab Results  Component Value Date   ETH <10 24/40/1027    Metabolic Disorder Labs: Lab Results  Component Value Date   HGBA1C 5.4 07/19/2021   MPG 108.28 07/19/2021   MPG 119.76 12/13/2019   No results found for: PROLACTIN Lab Results  Component Value Date   CHOL 178 07/19/2021   TRIG 85 07/19/2021    HDL 49 07/19/2021   CHOLHDL 3.6 07/19/2021  VLDL 17 07/19/2021   LDLCALC 112 (H) 07/19/2021    Physical Findings: AIMS:  , ,  ,  ,    CIWA:    COWS:     Musculoskeletal: Strength & Muscle Tone: within normal limits Gait & Station: normal Patient leans: N/A  Psychiatric Specialty Exam:  Presentation  General Appearance: Casual; Disheveled  Eye Contact:Fair  Speech:Pressured  Speech Volume:Increased  Handedness:No data recorded  Mood and Affect  Mood:Anxious  Affect:Appropriate   Thought Process  Thought Processes:Disorganized  Descriptions of Associations:Loose  Orientation:Partial  Thought Content:Perseveration; Scattered  History of Schizophrenia/Schizoaffective disorder:No  Duration of Psychotic Symptoms:Greater than six months  Hallucinations:No data recorded Ideas of Reference:Delusions  Suicidal Thoughts:No data recorded Homicidal Thoughts:No data recorded  Sensorium  Memory:Immediate Poor; Remote Poor  Judgment:Fair  Insight:Fair   Executive Functions  Concentration:Fair  Attention Span:Fair  [place]   Psychomotor Activity  Psychomotor Activity:No data recorded  Assets  Assets:Communication Skills; Desire for Improvement; Financial Resources/Insurance; Housing; Social Support   Sleep  Sleep:No data recorded   Physical Exam: Physical Exam Vitals and nursing note reviewed.  Constitutional:      Appearance: Normal appearance. She is normal weight.  Neurological:     General: No focal deficit present.     Mental Status: She is alert and oriented to person, place, and time.  Psychiatric:        Attention and Perception: Attention and perception normal.        Mood and Affect: Mood and affect normal.        Speech: Speech normal.        Behavior: Behavior normal. Behavior is cooperative.        Thought Content: Thought content normal.        Cognition and Memory: Cognition is  impaired. Memory is impaired.        Judgment: Judgment is impulsive.   Review of Systems  Constitutional: Negative.   HENT: Negative.    Eyes: Negative.   Respiratory: Negative.    Cardiovascular: Negative.   Gastrointestinal: Negative.   Genitourinary: Negative.   Musculoskeletal: Negative.   Skin: Negative.   Neurological: Negative.   Endo/Heme/Allergies: Negative.   Psychiatric/Behavioral:  Positive for memory loss. The patient has insomnia.   Blood pressure (!) 158/86, pulse 89, temperature 97.7 F (36.5 C), temperature source Oral, resp. rate 19, height 5\' 3"  (1.6 m), weight 87.5 kg, SpO2 94 %. Body mass index is 34.17 kg/m.   Treatment Plan Summary: Daily contact with patient to assess and evaluate symptoms and progress in treatment, Medication management, and Plan increase Thorazine to 100 mg at bedtime.  Parks Ranger, DO 08/03/2021, 2:59 PM

## 2021-08-03 NOTE — BH IP Treatment Plan (Signed)
Interdisciplinary Treatment and Diagnostic Plan Update  08/03/2021 Time of Session: 8:30AM Becky Davis MRN: [id number]  Principal Diagnosis: Major depressive disorder, single episode, severe with psychotic features ([place])  Secondary Diagnoses: Principal Problem:   Major depressive disorder, single episode, severe with psychotic features (Becky Davis) Active Problems:   Memory impairment   Current Medications:  Current Facility-Administered Medications  Medication Dose Route Frequency Provider Last Rate Last Admin   acetaminophen (TYLENOL) tablet 650 mg  650 mg Oral Q6H PRN Parks Ranger, DO   650 mg at 07/29/21 1930   albuterol (PROVENTIL) (2.5 MG/3ML) 0.083% nebulizer solution 2.5 mg  2.5 mg Nebulization Q4H PRN Parks Ranger, DO   2.5 mg at 07/30/21 0127   albuterol (VENTOLIN HFA) 108 (90 Base) MCG/ACT inhaler 2 puff  2 puff Inhalation Q6H PRN Clapacs, John T, MD   2 puff at 08/02/21 1820   alum & mag hydroxide-simeth (MAALOX/MYLANTA) 200-200-20 MG/5ML suspension 30 mL  30 mL Oral Q4H PRN Parks Ranger, DO       benztropine (COGENTIN) tablet 1 mg  1 mg Oral BH-q8a4p Parks Ranger, DO   1 mg at 08/03/21 0830   chlorproMAZINE (THORAZINE) tablet 75 mg  75 mg Oral QHS Parks Ranger, DO   75 mg at 08/02/21 2125   cloNIDine (CATAPRES) tablet 0.1 mg  0.1 mg Oral Q6H PRN Parks Ranger, DO       diphenhydrAMINE (BENADRYL) capsule 50 mg  50 mg Oral Q4H PRN Parks Ranger, DO   50 mg at 07/30/21 2110   Or   diphenhydrAMINE (BENADRYL) injection 50 mg  50 mg Intramuscular Q4H PRN Parks Ranger, DO   50 mg at 08/01/21 0001   donepezil (ARICEPT) tablet 5 mg  5 mg Oral QHS [name], [name]   5 mg at 08/02/21 2126   haloperidol (HALDOL) tablet 2 mg  2 mg Oral BH-q8a4p Parks Ranger, DO   2 mg at 08/03/21 0830   haloperidol (HALDOL) tablet 5 mg  5 mg Oral Q4H PRN Parks Ranger, DO   5 mg at 07/30/21 2110   Or    haloperidol lactate (HALDOL) injection 5 mg  5 mg Intramuscular Q4H PRN Parks Ranger, DO   5 mg at 08/01/21 0001   haloperidol decanoate (HALDOL DECANOATE) 100 MG/ML injection 50 mg  50 mg Intramuscular Q30 days Parks Ranger, DO   50 mg at 07/30/21 1155   levothyroxine (SYNTHROID) tablet 100 mcg  100 mcg Oral QAC breakfast Parks Ranger, DO   100 mcg at 08/03/21 0640   loratadine (CLARITIN) tablet 10 mg  10 mg Oral Daily Parks Ranger, DO   10 mg at 08/02/21 9242   LORazepam (ATIVAN) tablet 2 mg  2 mg Oral Q4H PRN Parks Ranger, DO   2 mg at 07/26/21 2221   Or   LORazepam (ATIVAN) injection 2 mg  2 mg Intramuscular Q4H PRN Parks Ranger, DO   2 mg at 08/01/21 0002   magnesium hydroxide (MILK OF MAGNESIA) suspension 30 mL  30 mL Oral Daily PRN Parks Ranger, DO       mirtazapine (REMERON) tablet 15 mg  15 mg Oral QHS Parks Ranger, DO   15 mg at 08/02/21 2126   montelukast (SINGULAIR) tablet 10 mg  10 mg Oral QHS Parks Ranger, DO   10 mg at 08/02/21 2126   PTA Medications: Medications Prior to Admission  Medication Sig  Dispense Refill Last Dose   albuterol (PROAIR HFA) 108 (90 Base) MCG/ACT inhaler Inhale 2 puffs into the lungs as needed (Shortness of breath). 6.7 g 3 07/17/2021   albuterol (PROVENTIL) (2.5 MG/3ML) 0.083% nebulizer solution Take 2.5 mg by nebulization every 4 (four) hours as needed for wheezing or shortness of breath.   07/17/2021   ARIPiprazole (ABILIFY) 5 MG tablet Take 5 mg by mouth at bedtime.    07/17/2021   budesonide-formoterol (SYMBICORT) 160-4.5 MCG/ACT inhaler Inhale 2 puffs into the lungs 2 (two) times daily. 1 Inhaler 6 07/17/2021   calcitRIOL (ROCALTROL) 0.25 MCG capsule Take 0.25 mcg by mouth daily.   07/17/2021   Calcium Carbonate-Vitamin D (CALTRATE 600+D PO) Take 500 mg by mouth 2 (two) times daily.    07/17/2021   cetirizine (ZYRTEC) 10 MG tablet Take 10 mg by mouth daily.   07/17/2021    gabapentin (NEURONTIN) 100 MG capsule Take 100 mg by mouth 3 (three) times daily.   07/17/2021   hydrALAZINE (APRESOLINE) 10 MG tablet Take 5 mg by mouth 3 (three) times daily.   07/17/2021   levothyroxine (SYNTHROID, LEVOTHROID) 100 MCG tablet Take 100 mcg by mouth daily before breakfast.   07/17/2021   losartan (COZAAR) 100 MG tablet Take 0.5 tablets (50 mg total) by mouth daily. 5 tablet 0 07/17/2021   montelukast (SINGULAIR) 10 MG tablet Take 10 mg by mouth at bedtime.   07/17/2021   PARoxetine (PAXIL) 20 MG tablet Take 20 mg by mouth at bedtime.    07/17/2021   aspirin EC 325 MG tablet Take 1 tablet (325 mg total) by mouth 2 (two) times daily. 60 tablet 0    baclofen (LIORESAL) 10 MG tablet Take 1 tablet (10 mg total) by mouth 3 (three) times daily. As needed for muscle spasm (Patient taking differently: Take 10 mg by mouth 2 (two) times daily. As needed for muscle spasm) 50 tablet 0    HYDROcodone-acetaminophen (NORCO) 10-325 MG tablet Take 1 tablet by mouth every 6 (six) hours as needed. 28 tablet 0    sennosides-docusate sodium (SENOKOT-S) 8.6-50 MG tablet Take 2 tablets by mouth daily. 30 tablet 1     Patient Stressors:    Patient Strengths: Supportive family/friends   Treatment Modalities: Medication Management, Group therapy, Case management,  1 to 1 session with clinician, Psychoeducation, Recreational therapy.   Physician Treatment Plan for Primary Diagnosis: Major depressive disorder, single episode, severe with psychotic features ([place]) Long Term Goal(s): Improvement in symptoms so as ready for discharge   Short Term Goals: Ability to identify changes in lifestyle to reduce recurrence of condition will improve Ability to verbalize feelings will improve Ability to disclose and discuss suicidal ideas Ability to demonstrate self-control will improve Ability to identify and develop effective coping behaviors will improve Ability to maintain clinical measurements within normal limits will  improve Compliance with prescribed medications will improve Ability to identify triggers associated with substance abuse/mental health issues will improve  Medication Management: Evaluate patient's response, side effects, and tolerance of medication regimen.  Therapeutic Interventions: 1 to 1 sessions, Unit Group sessions and Medication administration.  Evaluation of Outcomes: Progressing  Physician Treatment Plan for Secondary Diagnosis: Principal Problem:   Major depressive disorder, single episode, severe with psychotic features ([place]) Active Problems:   Memory impairment  Long Term Goal(s): Improvement in symptoms so as ready for discharge   Short Term Goals: Ability to identify changes in lifestyle to reduce recurrence of condition will improve Ability to verbalize feelings  will improve Ability to disclose and discuss suicidal ideas Ability to demonstrate self-control will improve Ability to identify and develop effective coping behaviors will improve Ability to maintain clinical measurements within normal limits will improve Compliance with prescribed medications will improve Ability to identify triggers associated with substance abuse/mental health issues will improve     Medication Management: Evaluate patient's response, side effects, and tolerance of medication regimen.  Therapeutic Interventions: 1 to 1 sessions, Unit Group sessions and Medication administration.  Evaluation of Outcomes: Progressing   RN Treatment Plan for Primary Diagnosis: Major depressive disorder, single episode, severe with psychotic features ([place]) Long Term Goal(s): Knowledge of disease and therapeutic regimen to maintain health will improve  Short Term Goals: Ability to remain free from injury will improve, Ability to verbalize frustration and anger appropriately will improve, Ability to demonstrate self-control, Ability to participate in decision making will improve, Ability to verbalize feelings  will improve, Ability to identify and develop effective coping behaviors will improve, and Compliance with prescribed medications will improve  Medication Management: RN will administer medications as ordered by provider, will assess and evaluate patient's response and provide education to patient for prescribed medication. RN will report any adverse and/or side effects to prescribing provider.  Therapeutic Interventions: 1 on 1 counseling sessions, Psychoeducation, Medication administration, Evaluate responses to treatment, Monitor vital signs and CBGs as ordered, Perform/monitor CIWA, COWS, AIMS and Fall Risk screenings as ordered, Perform wound care treatments as ordered.  Evaluation of Outcomes: Progressing   LCSW Treatment Plan for Primary Diagnosis: Major depressive disorder, single episode, severe with psychotic features ([place]) Long Term Goal(s): Safe transition to appropriate next level of care at discharge, Engage patient in therapeutic group addressing interpersonal concerns.  Short Term Goals: Engage patient in aftercare planning with referrals and resources, Increase social support, Increase ability to appropriately verbalize feelings, Increase emotional regulation, Facilitate acceptance of mental health diagnosis and concerns, Identify triggers associated with mental health/substance abuse issues, and Increase skills for wellness and recovery  Therapeutic Interventions: Assess for all discharge needs, 1 to 1 time with Social worker, Explore available resources and support systems, Assess for adequacy in community support network, Educate family and significant other(s) on suicide prevention, Complete Psychosocial Assessment, Interpersonal group therapy.  Evaluation of Outcomes: Progressing   Progress in Treatment: Attending groups: Yes. Participating in groups: Yes. Taking medication as prescribed: Yes. Toleration medication: Yes. Family/Significant other contact made: Yes,  individual(s) contacted:  patient's partner and daughter Patient understands diagnosis: No. Discussing patient identified problems/goals with staff: Yes. Medical problems stabilized or resolved: Yes. Denies suicidal/homicidal ideation: Yes. Issues/concerns per patient self-inventory: No. Other: None.  New problem(s) identified: No, Describe:  none.  New Short Term/Long Term Goal(s): Patient to work towards elimination of symptoms of psychosis, medication management for mood stabilization; development of comprehensive mental wellness/sobriety plan. Update 07/24/21: No changes at this time. Update 07/29/21: No changes at this time. Update 08/03/2021: No changes at this time.   Patient Goals:  "to get out of here.Marland Kitchen exercise, lose 5 pounds" Update 07/24/21: No changes at this time. Update 07/29/21: No changes at this time. Update 08/03/2021: No changes at this time.   Discharge Plan or Barriers: CSW will assist pt in development of appropriate discharge/aftercare plan. Update 07/24/21: No changes at this time. Update 07/29/21: Pt's family requested assistance in referral for home health care upon discharge. CSW will follow up with referrals and update family accordingly. Update 08/03/2021: Patient's family is making arrangements for patient to be discharged to  a partial hospitalization program. CSW will continue to support patient and family with discharge planning.   Reason for Continuation of Hospitalization: Delusions  Hallucinations Medication stabilization  Estimated Length of Stay: TBD   Scribe for Treatment Team: Sherilyn Dacosta 08/03/2021 8:34 AM

## 2021-08-03 NOTE — Progress Notes (Signed)
Patient received sitting in the day room drinking coffee. Patient is alert and oriented x3, but disoriented to situation. Patient is calm and cooperative during assessment. Patient denies pain, anxiety and depression. Denies SI, HI, AVH. Ate breakfast in the day room among peers with good appetite. Remain safe on the unit with Q15 minute safety checks.

## 2021-08-04 MED ORDER — CARBAMAZEPINE ER 200 MG PO TB12
200.0000 mg | ORAL_TABLET | Freq: Every day | ORAL | Status: DC
  Administered 2021-08-04 – 2021-08-05 (×2): 200 mg via ORAL
  Filled 2021-08-04 (×4): qty 1

## 2021-08-04 NOTE — Progress Notes (Signed)
Patient has been up almost every hour asking for water and stated she had a night mare about leaving. Patient requested to call her family about going home at 0200 Patient was oriented to time.   Compliant with medications. Denies SI/HI/A/VH and verbally contracted for safety. Support and encouragement provided. Patient continue to use her front wheel walker for ambulation and falls protocol in place.

## 2021-08-04 NOTE — Progress Notes (Signed)
[facility] MD Progress Note  08/04/2021 12:27 PM Becky Davis  MRN:  [id number] Subjective: Becky Davis continues to have trouble sleeping.  She tells me that she does not have any problems.  She feels good and wants to go home.  Her behavior in the daytime has improved but continues to be bad at night.  Patient has been up almost every hour asking for water and stated she had a night mare about leaving. Patient requested to call her family about going home at 0200 Patient was oriented to time.    Compliant with medications. Denies SI/HI/A/VH and verbally contracted for safety. Support and encouragement provided. Patient continue to use her front wheel walker for ambulation and falls protocol in place.  Principal Problem: Major depressive disorder, single episode, severe with psychotic features ([place]) Diagnosis: Principal Problem:   Major depressive disorder, single episode, severe with psychotic features ([place]) Active Problems:   Memory impairment  Total Time spent with patient: 15 minutes  Past Psychiatric History: Dr. Toy Care  Past Medical History:  Past Medical History:  Diagnosis Date   Arthritis    knees   Asthma    Headache    hx of 20 years ago   Heart murmur    hx of 10-15 years ago   Hypertension    Hypothyroidism    Pneumonia    Pre-diabetes    Thyroid nodule     Past Surgical History:  Procedure Laterality Date   ABDOMINAL HYSTERECTOMY     thyroid goiter     surgery   THYROID LOBECTOMY     06-25-17 Dr. Harlow Asa   Left   THYROID LOBECTOMY Left 06/25/2017   Procedure: LEFT THYROID LOBECTOMY;  Surgeon: Armandina Gemma, MD;  Location: [facility];  Service: General;  Laterality: Left;   TOTAL KNEE ARTHROPLASTY Right 12/20/2019   Procedure: TOTAL KNEE ARTHROPLASTY;  Surgeon: Marchia Bond, MD;  Location: [facility];  Service: Orthopedics;  Laterality: Right;   Family History:  Family History  Problem Relation Age of Onset   Emphysema Mother    Cancer Mother    Heart disease Mother     Cancer Sister    Cancer Brother    Social History:  Social History   Substance and Sexual Activity  Alcohol Use No     Social History   Substance and Sexual Activity  Drug Use No    Social History   Socioeconomic History   Marital status: Married    Spouse name: Not on file   Number of children: 3   Years of education: Not on file   Highest education level: Not on file  Occupational History   Not on file  Tobacco Use   Smoking status: Never   Smokeless tobacco: Never  Vaping Use   Vaping Use: Never used  Substance and Sexual Activity   Alcohol use: No   Drug use: No   Sexual activity: Not Currently  Other Topics Concern   Not on file  Social History Narrative   Not on file   Social Determinants of Health   Financial Resource Strain: Not on file  Food Insecurity: Not on file  Transportation Needs: Not on file  Physical Activity: Not on file  Stress: Not on file  Social Connections: Not on file   Additional Social History:                         Sleep: Poor  Appetite:  Good  Current Medications:  Current Facility-Administered Medications  Medication Dose Route Frequency Provider Last Rate Last Admin   acetaminophen (TYLENOL) tablet 650 mg  650 mg Oral Q6H PRN Parks Ranger, DO   650 mg at 08/03/21 1516   albuterol (PROVENTIL) (2.5 MG/3ML) 0.083% nebulizer solution 2.5 mg  2.5 mg Nebulization Q4H PRN Parks Ranger, DO   2.5 mg at 07/30/21 0127   albuterol (VENTOLIN HFA) 108 (90 Base) MCG/ACT inhaler 2 puff  2 puff Inhalation Q6H PRN Clapacs, John T, MD   2 puff at 08/04/21 0301   alum & mag hydroxide-simeth (MAALOX/MYLANTA) 200-200-20 MG/5ML suspension 30 mL  30 mL Oral Q4H PRN Parks Ranger, DO       benztropine (COGENTIN) tablet 1 mg  1 mg Oral BH-q8a4p Parks Ranger, DO   1 mg at 08/04/21 2423   chlorproMAZINE (THORAZINE) tablet 100 mg  100 mg Oral QHS Parks Ranger, DO   100 mg at 08/03/21 2111    cloNIDine (CATAPRES) tablet 0.1 mg  0.1 mg Oral Q6H PRN Parks Ranger, DO       diphenhydrAMINE (BENADRYL) capsule 50 mg  50 mg Oral Q4H PRN Parks Ranger, DO   50 mg at 07/30/21 2110   Or   diphenhydrAMINE (BENADRYL) injection 50 mg  50 mg Intramuscular Q4H PRN Parks Ranger, DO   50 mg at 08/01/21 0001   donepezil (ARICEPT) tablet 5 mg  5 mg Oral QHS [name], [name]   5 mg at 08/03/21 2112   haloperidol (HALDOL) tablet 2 mg  2 mg Oral BH-q8a4p Parks Ranger, DO   2 mg at 08/04/21 5361   haloperidol (HALDOL) tablet 5 mg  5 mg Oral Q4H PRN Parks Ranger, DO   5 mg at 07/30/21 2110   Or   haloperidol lactate (HALDOL) injection 5 mg  5 mg Intramuscular Q4H PRN Parks Ranger, DO   5 mg at 08/01/21 0001   haloperidol decanoate (HALDOL DECANOATE) 100 MG/ML injection 50 mg  50 mg Intramuscular Q30 days Parks Ranger, DO   50 mg at 07/30/21 1155   levothyroxine (SYNTHROID) tablet 100 mcg  100 mcg Oral QAC breakfast Parks Ranger, DO   100 mcg at 08/04/21 0612   loratadine (CLARITIN) tablet 10 mg  10 mg Oral Daily Parks Ranger, DO   10 mg at 08/04/21 4431   LORazepam (ATIVAN) tablet 2 mg  2 mg Oral Q4H PRN Parks Ranger, DO   2 mg at 07/26/21 2221   Or   LORazepam (ATIVAN) injection 2 mg  2 mg Intramuscular Q4H PRN Parks Ranger, DO   2 mg at 08/01/21 0002   magnesium hydroxide (MILK OF MAGNESIA) suspension 30 mL  30 mL Oral Daily PRN Parks Ranger, DO       mirtazapine (REMERON) tablet 15 mg  15 mg Oral QHS Parks Ranger, DO   15 mg at 08/03/21 2111   montelukast (SINGULAIR) tablet 10 mg  10 mg Oral QHS Parks Ranger, DO   10 mg at 08/03/21 2112    Lab Results: No results found for this or any previous visit (from the past 48 hour(s)).  Blood Alcohol level:  Lab Results  Component Value Date   ETH <10 54/00/8676    Metabolic Disorder Labs: Lab Results   Component Value Date   HGBA1C 5.4 07/19/2021   MPG 108.28 07/19/2021   MPG 119.76 12/13/2019   No results found for:  PROLACTIN Lab Results  Component Value Date   CHOL 178 07/19/2021   TRIG 85 07/19/2021   HDL 49 07/19/2021   CHOLHDL 3.6 07/19/2021   VLDL 17 07/19/2021   LDLCALC 112 (H) 07/19/2021    Physical Findings: AIMS:  , ,  ,  ,    CIWA:    COWS:     Musculoskeletal: Strength & Muscle Tone: within normal limits Gait & Station: normal Patient leans: N/A  Psychiatric Specialty Exam:  Presentation  General Appearance: Casual; Disheveled  Eye Contact:Fair  Speech:Pressured  Speech Volume:Increased  Handedness:No data recorded  Mood and Affect  Mood:Anxious  Affect:Appropriate   Thought Process  Thought Processes:Disorganized  Descriptions of Associations:Loose  Orientation:Partial  Thought Content:Perseveration; Scattered  History of Schizophrenia/Schizoaffective disorder:No  Duration of Psychotic Symptoms:Greater than six months  Hallucinations:No data recorded Ideas of Reference:Delusions  Suicidal Thoughts:No data recorded Homicidal Thoughts:No data recorded  Sensorium  Memory:Immediate Poor; Remote Poor  Judgment:Fair  Insight:Fair   Executive Functions  Concentration:Fair  Attention Span:Fair  [place]   Psychomotor Activity  Psychomotor Activity:No data recorded  Assets  Assets:Communication Skills; Desire for Improvement; Financial Resources/Insurance; Housing; Social Support   Sleep  Sleep:No data recorded   Physical Exam: Physical Exam Vitals and nursing note reviewed.  Constitutional:      Appearance: Normal appearance. She is normal weight.  Neurological:     General: No focal deficit present.     Mental Status: She is alert and oriented to person, place, and time.  Psychiatric:        Attention and Perception: Attention and perception normal.        Mood  and Affect: Mood and affect normal.        Speech: Speech is tangential.        Behavior: Behavior normal. Behavior is cooperative.        Thought Content: Thought content is delusional.        Cognition and Memory: Cognition is impaired. Memory is impaired.        Judgment: Judgment is impulsive.   Review of Systems  Constitutional: Negative.   HENT: Negative.    Eyes: Negative.   Respiratory: Negative.    Cardiovascular: Negative.   Gastrointestinal: Negative.   Genitourinary: Negative.   Musculoskeletal: Negative.   Skin: Negative.   Neurological: Negative.   Endo/Heme/Allergies: Negative.   Psychiatric/Behavioral:  The patient has insomnia.   Blood pressure (!) 141/62, pulse 82, temperature 98 F (36.7 C), temperature source Oral, resp. rate 18, height 5\' 3"  (1.6 m), weight 87.5 kg, SpO2 96 %. Body mass index is 34.17 kg/m.   Treatment Plan Summary: Daily contact with patient to assess and evaluate symptoms and progress in treatment, Medication management, and Plan Tegretol at bedtime.  Parks Ranger, DO 08/04/2021, 12:27 PM

## 2021-08-04 NOTE — Plan of Care (Signed)
Patient visible in the milieu. Appropriate with staff & peers. Patient stated that the brown pill at night messed up her sleep and she had all weird dreams. Patient resisting to lie down for nap. Denies SI,HI and AVH. Appetite and energy level good. ADLs maintained. Support and encouragement given.

## 2021-08-04 NOTE — Group Note (Signed)
[facility] LCSW Group Therapy Note    Group Date: 08/04/2021 Start Time: 1300 End Time: 1400  Type of Therapy and Topic:  Group Therapy:  Overcoming Obstacles  Participation Level:  [facility] PARTICIPATION LEVEL: Active  Mood:  Description of Group:   In this group patients will be encouraged to explore what they see as obstacles to their own wellness and recovery. They will be guided to discuss their thoughts, feelings, and behaviors related to these obstacles. The group will process together ways to cope with barriers, with attention given to specific choices patients can make. Each patient will be challenged to identify changes they are motivated to make in order to overcome their obstacles. This group will be process-oriented, with patients participating in exploration of their own experiences as well as giving and receiving support and challenge from other group members.  Therapeutic Goals: 1. Patient will identify personal and current obstacles as they relate to admission. 2. Patient will identify barriers that currently interfere with their wellness or overcoming obstacles.  3. Patient will identify feelings, thought process and behaviors related to these barriers. 4. Patient will identify two changes they are willing to make to overcome these obstacles:    Summary of Patient Progress   Patient was present for the entirety of the group session. Patient was an active listener and participated in the topic of discussion, provided helpful advice to others, and added nuance to topic of conversation. Patient was largely tangential to topic of discussion, exhibiting manic features and oversharing. Patient was often difficult to redirect in group.    Therapeutic Modalities:   Cognitive Behavioral Therapy Solution Focused Therapy Motivational Interviewing Relapse Prevention Therapy   Durenda Hurt, [place]

## 2021-08-05 NOTE — Care Management Important Message (Signed)
Important Message  Patient Details  Name: Becky Davis MRN: [id number] Date of Birth: 1951-06-19   Medicare Important Message Given:        Becky Davis, Becky Davis 08/05/2021, 4:30 PM

## 2021-08-05 NOTE — Group Note (Signed)
[facility] LCSW Group Therapy Note    Group Date: 08/05/2021 Start Time: 1330 End Time: 1420  Type of Therapy and Topic:  Group Therapy:  Overcoming Obstacles  Participation Level:  [facility] PARTICIPATION LEVEL: Active  Mood:  Description of Group:   In this group patients will be encouraged to explore what they see as obstacles to their own wellness and recovery. They will be guided to discuss their thoughts, feelings, and behaviors related to these obstacles. The group will process together ways to cope with barriers, with attention given to specific choices patients can make. Each patient will be challenged to identify changes they are motivated to make in order to overcome their obstacles. This group will be process-oriented, with patients participating in exploration of their own experiences as well as giving and receiving support and challenge from other group members.  Therapeutic Goals: 1. Patient will identify personal and current obstacles as they relate to admission. 2. Patient will identify barriers that currently interfere with their wellness or overcoming obstacles.  3. Patient will identify feelings, thought process and behaviors related to these barriers. 4. Patient will identify two changes they are willing to make to overcome these obstacles:    Summary of Patient Progress   Patient was present for the entirety of the group session. Patient was an active listener and participated in the topic of discussion, provided helpful advice to others, and added nuance to topic of conversation. She said that her husband also provides advice when overcoming obstacles by stating one can imagine anything "that's scary or any adversary in a pink tutu and it'll take away any fears." She also stated that one obstacle is being a senior age and how she is a target for financial scams and how that's become a difficult part of aging.   Therapeutic  Modalities:   Cognitive Behavioral Therapy Solution Focused Therapy Motivational Interviewing Relapse Prevention Therapy   Elmer Boutelle A [place], LCSWA

## 2021-08-05 NOTE — Progress Notes (Signed)
Patient asking if she will be able to get her medication list when she gets discharged. She has been pleasant this shift compliant with all her medications slept through for 4 hours and woke up for water and went back to bed and slept for 2 hours. No adverse drug noted. Denies SI/HI/A/VH and verbally contracted for safety.  She is using her front wheel walker for ambulation and fall protocol in place. Support and encouragement provided.

## 2021-08-05 NOTE — Plan of Care (Signed)
Patient is A&Ox 4.  Affect is pleasant, calm and cooperative.  Denies anxiety, depression, SI, HI, AVH or pain.  Patient is compliant with all medication.  Will continue to monitor with Q 15 min safety checks.   Problem: Education: Goal: Knowledge of [facility] Education information/materials will improve Outcome: Progressing Goal: Emotional status will improve Outcome: Progressing Goal: Mental status will improve Outcome: Progressing Goal: Verbalization of understanding the information provided will improve Outcome: Progressing   Problem: Activity: Goal: Interest or engagement in activities will improve Outcome: Progressing Goal: Sleeping patterns will improve Outcome: Progressing   Problem: Coping: Goal: Ability to verbalize frustrations and anger appropriately will improve Outcome: Progressing Goal: Ability to demonstrate self-control will improve Outcome: Progressing   Problem: Health Behavior/Discharge Planning: Goal: Identification of resources available to assist in meeting health care needs will improve Outcome: Progressing Goal: Compliance with treatment plan for underlying cause of condition will improve Outcome: Progressing   Problem: Physical Regulation: Goal: Ability to maintain clinical measurements within normal limits will improve Outcome: Progressing   Problem: Safety: Goal: Periods of time without injury will increase Outcome: Progressing   Problem: Activity: Goal: Will verbalize the importance of balancing activity with adequate rest periods Outcome: Progressing   Problem: Education: Goal: Will be free of psychotic symptoms Outcome: Progressing Goal: Knowledge of the prescribed therapeutic regimen will improve Outcome: Progressing   Problem: Coping: Goal: Coping ability will improve Outcome: Progressing Goal: Will verbalize feelings Outcome: Progressing   Problem: Health Behavior/Discharge Planning: Goal: Compliance with prescribed  medication regimen will improve Outcome: Progressing   Problem: Nutritional: Goal: Ability to achieve adequate nutritional intake will improve Outcome: Progressing   Problem: Role Relationship: Goal: Ability to communicate needs accurately will improve Outcome: Progressing Goal: Ability to interact with others will improve Outcome: Progressing   Problem: Safety: Goal: Ability to redirect hostility and anger into socially appropriate behaviors will improve Outcome: Progressing Goal: Ability to remain free from injury will improve Outcome: Progressing   Problem: Self-Care: Goal: Ability to participate in self-care as condition permits will improve Outcome: Progressing   Problem: Self-Concept: Goal: Will verbalize positive feelings about self Outcome: Progressing   Problem: Safety: Goal: Violent Restraint(s) Outcome: Progressing

## 2021-08-05 NOTE — Progress Notes (Signed)
[facility] MD Progress Note  08/05/2021 10:31 AM Becky Davis  MRN:  [id number] Subjective: Becky Davis had a good night last night.  She finally slept 4 to 5 hours.  Thought process is much improved and more goal directed.  She denies any suicidal ideation or depression.  She is taking her medications as prescribed and denies any side effects.  Patient asking if she will be able to get her medication list when she gets discharged. She has been pleasant this shift compliant with all her medications slept through for 4 hours and woke up for water and went back to bed and slept for 2 hours. No adverse drug noted. Denies SI/HI/A/VH and verbally contracted for safety  Principal Problem: Major depressive disorder, single episode, severe with psychotic features ([place]) Diagnosis: Principal Problem:   Major depressive disorder, single episode, severe with psychotic features ([place]) Active Problems:   Memory impairment  Total Time spent with patient: 15 minutes  Past Psychiatric History: Dr. Toy Care  Past Medical History:  Past Medical History:  Diagnosis Date   Arthritis    knees   Asthma    Headache    hx of 20 years ago   Heart murmur    hx of 10-15 years ago   Hypertension    Hypothyroidism    Pneumonia    Pre-diabetes    Thyroid nodule     Past Surgical History:  Procedure Laterality Date   ABDOMINAL HYSTERECTOMY     thyroid goiter     surgery   THYROID LOBECTOMY     06-25-17 Dr. Harlow Asa   Left   THYROID LOBECTOMY Left 06/25/2017   Procedure: LEFT THYROID LOBECTOMY;  Surgeon: Armandina Gemma, MD;  Location: [facility];  Service: General;  Laterality: Left;   TOTAL KNEE ARTHROPLASTY Right 12/20/2019   Procedure: TOTAL KNEE ARTHROPLASTY;  Surgeon: Marchia Bond, MD;  Location: [facility];  Service: Orthopedics;  Laterality: Right;   Family History:  Family History  Problem Relation Age of Onset   Emphysema Mother    Cancer Mother    Heart disease Mother    Cancer Sister    Cancer Brother      Social History:  Social History   Substance and Sexual Activity  Alcohol Use No     Social History   Substance and Sexual Activity  Drug Use No    Social History   Socioeconomic History   Marital status: Married    Spouse name: Not on file   Number of children: 3   Years of education: Not on file   Highest education level: Not on file  Occupational History   Not on file  Tobacco Use   Smoking status: Never   Smokeless tobacco: Never  Vaping Use   Vaping Use: Never used  Substance and Sexual Activity   Alcohol use: No   Drug use: No   Sexual activity: Not Currently  Other Topics Concern   Not on file  Social History Narrative   Not on file   Social Determinants of Health   Financial Resource Strain: Not on file  Food Insecurity: Not on file  Transportation Needs: Not on file  Physical Activity: Not on file  Stress: Not on file  Social Connections: Not on file   Additional Social History:                         Sleep: Good  Appetite:  Good  Current Medications: Current Facility-Administered Medications  Medication Dose Route Frequency Provider Last Rate Last Admin   acetaminophen (TYLENOL) tablet 650 mg  650 mg Oral Q6H PRN Parks Ranger, DO   650 mg at 08/03/21 1516   albuterol (PROVENTIL) (2.5 MG/3ML) 0.083% nebulizer solution 2.5 mg  2.5 mg Nebulization Q4H PRN Parks Ranger, DO   2.5 mg at 07/30/21 0127   albuterol (VENTOLIN HFA) 108 (90 Base) MCG/ACT inhaler 2 puff  2 puff Inhalation Q6H PRN Clapacs, John T, MD   2 puff at 08/05/21 1004   alum & mag hydroxide-simeth (MAALOX/MYLANTA) 200-200-20 MG/5ML suspension 30 mL  30 mL Oral Q4H PRN Parks Ranger, DO       benztropine (COGENTIN) tablet 1 mg  1 mg Oral BH-q8a4p Parks Ranger, DO   1 mg at 08/05/21 5784   carbamazepine (TEGRETOL XR) 12 hr tablet 200 mg  200 mg Oral QHS Parks Ranger, DO   200 mg at 08/04/21 2201   chlorproMAZINE  (THORAZINE) tablet 100 mg  100 mg Oral QHS Parks Ranger, DO   100 mg at 08/04/21 2112   cloNIDine (CATAPRES) tablet 0.1 mg  0.1 mg Oral Q6H PRN Parks Ranger, DO   0.1 mg at 08/04/21 2113   diphenhydrAMINE (BENADRYL) capsule 50 mg  50 mg Oral Q4H PRN Parks Ranger, DO   50 mg at 07/30/21 2110   Or   diphenhydrAMINE (BENADRYL) injection 50 mg  50 mg Intramuscular Q4H PRN Parks Ranger, DO   50 mg at 08/01/21 0001   donepezil (ARICEPT) tablet 5 mg  5 mg Oral QHS [name], [name]   5 mg at 08/04/21 2113   haloperidol (HALDOL) tablet 2 mg  2 mg Oral BH-q8a4p Parks Ranger, DO   2 mg at 08/05/21 6962   haloperidol (HALDOL) tablet 5 mg  5 mg Oral Q4H PRN Parks Ranger, DO   5 mg at 07/30/21 2110   Or   haloperidol lactate (HALDOL) injection 5 mg  5 mg Intramuscular Q4H PRN Parks Ranger, DO   5 mg at 08/01/21 0001   haloperidol decanoate (HALDOL DECANOATE) 100 MG/ML injection 50 mg  50 mg Intramuscular Q30 days Parks Ranger, DO   50 mg at 07/30/21 1155   levothyroxine (SYNTHROID) tablet 100 mcg  100 mcg Oral QAC breakfast Parks Ranger, DO   100 mcg at 08/05/21 0844   loratadine (CLARITIN) tablet 10 mg  10 mg Oral Daily Parks Ranger, DO   10 mg at 08/05/21 9528   LORazepam (ATIVAN) tablet 2 mg  2 mg Oral Q4H PRN Parks Ranger, DO   2 mg at 07/26/21 2221   Or   LORazepam (ATIVAN) injection 2 mg  2 mg Intramuscular Q4H PRN Parks Ranger, DO   2 mg at 08/01/21 0002   magnesium hydroxide (MILK OF MAGNESIA) suspension 30 mL  30 mL Oral Daily PRN Parks Ranger, DO       mirtazapine (REMERON) tablet 15 mg  15 mg Oral QHS Parks Ranger, DO   15 mg at 08/04/21 2113   montelukast (SINGULAIR) tablet 10 mg  10 mg Oral QHS Parks Ranger, DO   10 mg at 08/04/21 2114    Lab Results: No results found for this or any previous visit (from the past 48 hour(s)).  Blood  Alcohol level:  Lab Results  Component Value Date   ETH <10 41/32/4401    Metabolic Disorder Labs: Lab Results  Component Value Date   HGBA1C 5.4 07/19/2021   MPG 108.28 07/19/2021   MPG 119.76 12/13/2019   No results found for: PROLACTIN Lab Results  Component Value Date   CHOL 178 07/19/2021   TRIG 85 07/19/2021   HDL 49 07/19/2021   CHOLHDL 3.6 07/19/2021   VLDL 17 07/19/2021   LDLCALC 112 (H) 07/19/2021    Physical Findings: AIMS:  , ,  ,  ,    CIWA:    COWS:     Musculoskeletal: Strength & Muscle Tone: within normal limits Gait & Station: normal Patient leans: N/A  Psychiatric Specialty Exam:  Presentation  General Appearance: Casual; Disheveled  Eye Contact:Fair  Speech:Pressured  Speech Volume:Increased  Handedness:No data recorded  Mood and Affect  Mood:Anxious  Affect:Appropriate   Thought Process  Thought Processes:Disorganized  Descriptions of Associations:Loose  Orientation:Partial  Thought Content:Perseveration; Scattered  History of Schizophrenia/Schizoaffective disorder:No  Duration of Psychotic Symptoms:Greater than six months  Hallucinations:No data recorded Ideas of Reference:Delusions  Suicidal Thoughts:No data recorded Homicidal Thoughts:No data recorded  Sensorium  Memory:Immediate Poor; Remote Poor  Judgment:Fair  Insight:Fair   Executive Functions  Concentration:Fair  Attention Span:Fair  Needles   Psychomotor Activity  Psychomotor Activity:No data recorded  Assets  Assets:Communication Skills; Desire for Improvement; Financial Resources/Insurance; Housing; Social Support   Sleep  Sleep:No data recorded   Physical Exam: Physical Exam Vitals and nursing note reviewed.  Constitutional:      Appearance: Normal appearance. She is normal weight.  Neurological:     General: No focal deficit present.     Mental Status: She is alert and oriented to  person, place, and time.  Psychiatric:        Attention and Perception: Attention and perception normal.        Mood and Affect: Mood and affect normal.        Speech: Speech normal.        Behavior: Behavior normal. Behavior is cooperative.        Thought Content: Thought content normal.        Cognition and Memory: Cognition is impaired. Memory is impaired.        Judgment: Judgment normal.   Review of Systems  Constitutional: Negative.   HENT: Negative.    Eyes: Negative.   Respiratory: Negative.    Cardiovascular: Negative.   Gastrointestinal: Negative.   Genitourinary: Negative.   Musculoskeletal: Negative.   Skin: Negative.   Neurological: Negative.   Endo/Heme/Allergies: Negative.   Psychiatric/Behavioral:  Positive for memory loss. The patient has insomnia.   Blood pressure 126/66, pulse 72, temperature 97.7 F (36.5 C), resp. rate 18, height 5\' 3"  (1.6 m), weight 87.5 kg, SpO2 97 %. Body mass index is 34.17 kg/m.   Treatment Plan Summary: Daily contact with patient to assess and evaluate symptoms and progress in treatment, Medication management, and Plan continue current medications.  Her daughter is going to come in tonight to visit and hopefully we can plan on discharge sometime this week.  Parks Ranger, DO 08/05/2021, 10:31 AM

## 2021-08-06 MED ORDER — BENZTROPINE MESYLATE 1 MG PO TABS: 1.0000 mg | ORAL_TABLET | ORAL | 2 refills | Status: DC

## 2021-08-06 MED ORDER — CHLORPROMAZINE HCL 100 MG PO TABS: 100.0000 mg | ORAL_TABLET | Freq: Every day | ORAL | 2 refills | Status: DC

## 2021-08-06 MED ORDER — DONEPEZIL HCL 5 MG PO TABS: 5.0000 mg | ORAL_TABLET | Freq: Every day | ORAL | 2 refills | Status: AC

## 2021-08-06 MED ORDER — MIRTAZAPINE 15 MG PO TABS: 15.0000 mg | ORAL_TABLET | Freq: Every day | ORAL | 2 refills | Status: AC

## 2021-08-06 MED ORDER — HALOPERIDOL 2 MG PO TABS: 2.0000 mg | ORAL_TABLET | ORAL | 2 refills | Status: DC

## 2021-08-06 MED ORDER — HALOPERIDOL DECANOATE 100 MG/ML IM SOLN: 50.0000 mg | INTRAMUSCULAR | 2 refills | Status: DC

## 2021-08-06 MED ORDER — CARBAMAZEPINE ER 200 MG PO TB12: 200.0000 mg | ORAL_TABLET | Freq: Every day | ORAL | 2 refills | Status: DC

## 2021-08-06 NOTE — Progress Notes (Signed)
Patient ID: Becky Davis, female   DOB: 1950-08-22, 71 y.o.   MRN: [id number]  Patient denies SI, HI, AVH, anxiety.  Pt endorses readiness for discharge and intention to follow up with scheduled provider.  Medication list and discharge plan documentation reviewed with patient by RN.  Patient belongings returned to patient.  Patient was escorted off unit by staff.  Safety was maintained during discharge process.

## 2021-08-06 NOTE — Progress Notes (Signed)
Patient is alert and oriented times 4. Mood and affect appropriate. Patient denies pain at this time. She denies SI, HI, and AVH. Also denies feelings of anxiety and depression at this time. States she slept good last night. Morning meds given whole by mouth W/O difficulty. Ate breakfast in day room- appetite good. Patient remains on unit with Q15 minute checks in place.

## 2021-08-06 NOTE — Plan of Care (Signed)
Pt prepared for discharge.  Medication plan and follow up plans reviewed with Pt.  Pt verbalized understanding.

## 2021-08-06 NOTE — Progress Notes (Signed)
°  [facility] Adult Case Management Discharge Plan :  Will you be returning to the same living situation after discharge:  Yes,  pt will be returning home At discharge, do you have transportation home?: Yes,  pt's husband is providing transportation Do you have the ability to pay for your medications  Release of information consent forms completed and in the chart;  Patient's signature needed at discharge.  Patient to Follow up at:  Follow-up Information     Chucky May, MD Follow up.   Specialty: Psychiatry Why: You have an appointment scheduled with Dr. Toy Care for Monday February 6th at 1:45pm. Notify practice if interested in virtual or in-person appointment. Please call to reschedule if needed. Thanks! Contact information: [address] [place] [address] [phone]                 Next level of care provider has access to [place] and Suicide Prevention discussed: Yes,  completed with pt's family member     Has patient been referred to the Quitline?: N/A patient is not a smoker  Patient has been referred for addiction treatment: N/A  Herron Fero A [place], [place] 08/06/2021, 9:58 AM

## 2021-08-06 NOTE — Discharge Summary (Signed)
Physician Discharge Summary Note  Patient:  Becky Davis is an 71 y.o., female MRN:  [id number] DOB:  06-May-1951 Patient phone:  [phone] (home)  Patient address:   [address] [place] [address],  Total Time spent with patient: 1 hour  Date of Admission:  07/17/2021 Date of Discharge: 08/06/2021  Reason for Admission:  71 yo F with a cc of increased agitation and hallucinations.  This apparently is not abnormal for her.  The patient is on Abilify and it stopped taking her medication for about 7 months and then just resumed about a week ago.  This medication was supposed to be increased and it sounds like the patient was not totally on board.  She tells me her primary complaint is she thinks someone has broken into her house and stolen her medicines and replace it with something that would make her sick.  She does endorse hallucinations otherwise.  Her daughter is here with her and endorses that she has never required hospitalization for mental illness.  Principal Problem: Major depressive disorder, single episode, severe with psychotic features [facility]) Discharge Diagnoses: Principal Problem:   Major depressive disorder, single episode, severe with psychotic features ([place]) Active Problems:   Memory impairment   Past Psychiatric History: Sees Dr. Toy Care out-patient for many years for a history of depression.  Past Medical History:  Past Medical History:  Diagnosis Date   Arthritis    knees   Asthma    Headache    hx of 20 years ago   Heart murmur    hx of 10-15 years ago   Hypertension    Hypothyroidism    Pneumonia    Pre-diabetes    Thyroid nodule     Past Surgical History:  Procedure Laterality Date   ABDOMINAL HYSTERECTOMY     thyroid goiter     surgery   THYROID LOBECTOMY     06-25-17 Dr. Harlow Asa   Left   THYROID LOBECTOMY Left 06/25/2017   Procedure: LEFT THYROID LOBECTOMY;  Surgeon: Armandina Gemma, MD;  Location: [facility];  Service: General;  Laterality: Left;    TOTAL KNEE ARTHROPLASTY Right 12/20/2019   Procedure: TOTAL KNEE ARTHROPLASTY;  Surgeon: Marchia Bond, MD;  Location: [facility];  Service: Orthopedics;  Laterality: Right;   Family History:  Family History  Problem Relation Age of Onset   Emphysema Mother    Cancer Mother    Heart disease Mother    Cancer Sister    Cancer Brother    Family Psychiatric  History: Unknown Social History:  Social History   Substance and Sexual Activity  Alcohol Use No     Social History   Substance and Sexual Activity  Drug Use No    Social History   Socioeconomic History   Marital status: Married    Spouse name: Not on file   Number of children: 3   Years of education: Not on file   Highest education level: Not on file  Occupational History   Not on file  Tobacco Use   Smoking status: Never   Smokeless tobacco: Never  Vaping Use   Vaping Use: Never used  Substance and Sexual Activity   Alcohol use: No   Drug use: No   Sexual activity: Not Currently  Other Topics Concern   Not on file  Social History Narrative   Not on file   Social Determinants of Health   Financial Resource Strain: Not on file  Food Insecurity: Not on file  Transportation Needs: Not on file  Physical Activity: Not on file  Stress: Not on file  Social Connections: Not on file    Hospital Course: Becky Davis was originally admitted to geriatric psychiatry with a recent history of insomnia, delusions, and cognitive decline.  She was committed by her family on an involuntary basis.  She refused to sign herself in.  Initially, she talked about going home.  She slept very little for the majority of her hospitalization.  Her Paxil and Abilify were discontinued and she was started on Zyprexa initially.  For the first week or 2 she was cheeking her meds and required as needed medications.  After a while she became more pleasant and cooperative mostly after some medication changes and sleeping better.  She was placed on Haldol  and Ativan originally.  The family would like her to be on a long-acting injection and I was hoping it would be Abilify but she did not improve so we changed it to Haldol 5 mg twice a day and gave her Haldol decanoate 50 mg and decreased her oral dose to 2 mg twice a day.  She was also started on Remeron at bedtime to help with sleep and in the end she required Thorazine and Tegretol which finally got her to sleep through the night.  Towards the end of her hospitalization she was pleasant and cooperative.  She denied depression and her cognition improved and her thought process became more goal directed and she did not talk about hallucinations or delusions.  Originally, she thought machines were talking to her in her bedroom and she complained of hearing voices.  This eventually subsided.  An on-call doctor thought that she was in the early stages of dementia and started her on Aricept 5 mg at bedtime.  Eventually, she took her medications as prescribed and there was no evidence of side effects.  There is no EPS or TD.  She denied any side effects.  She became very pleasant and cooperative and conversational and it was felt that she maximized hospitalization and she was discharged home.  On the day of discharge she denied suicidal ideation or homicidal ideation, auditory or visual hallucinations.  Her judgment and insight were fair.  Physical Findings: AIMS:  , ,  ,  ,    CIWA:    COWS:     Musculoskeletal: Strength & Muscle Tone: within normal limits Gait & Station: normal Patient leans: N/A   Psychiatric Specialty Exam:  Presentation  General Appearance: Casual; Disheveled  Eye Contact:Fair  Speech:Pressured  Speech Volume:Increased  Handedness:No data recorded  Mood and Affect  Mood:Anxious  Affect:Appropriate   Thought Process  Thought Processes:Disorganized  Descriptions of Associations:Loose  Orientation:Partial  Thought Content:Perseveration; Scattered  History of  Schizophrenia/Schizoaffective disorder:No  Duration of Psychotic Symptoms:Greater than six months  Hallucinations:No data recorded Ideas of Reference:Delusions  Suicidal Thoughts:No data recorded Homicidal Thoughts:No data recorded  Sensorium  Memory:Immediate Poor; Remote Poor  Judgment:Fair  Insight:Fair   Executive Functions  Concentration:Fair  Attention Span:Fair  [place]   Psychomotor Activity  Psychomotor Activity:No data recorded  Assets  Assets:Communication Skills; Desire for Improvement; Financial Resources/Insurance; Housing; Social Support   Sleep  Sleep:No data recorded   Physical Exam: Physical Exam Vitals and nursing note reviewed.  Constitutional:      Appearance: Normal appearance. She is normal weight.  Neurological:     General: No focal deficit present.     Mental Status: She is alert  and oriented to person, place, and time.  Psychiatric:        Attention and Perception: Attention and perception normal.        Mood and Affect: Mood and affect normal.        Speech: Speech normal.        Behavior: Behavior normal. Behavior is cooperative.        Thought Content: Thought content normal.        Cognition and Memory: Memory normal. Cognition is impaired.        Judgment: Judgment normal.   Review of Systems  Constitutional: Negative.   HENT: Negative.    Eyes: Negative.   Respiratory: Negative.    Cardiovascular: Negative.   Gastrointestinal: Negative.   Genitourinary: Negative.   Musculoskeletal: Negative.   Skin: Negative.   Neurological: Negative.   Endo/Heme/Allergies: Negative.   Psychiatric/Behavioral:  Positive for memory loss.   Blood pressure 137/82, pulse 70, temperature 97.8 F (36.6 C), temperature source Oral, resp. rate 18, height 5\' 3"  (1.6 m), weight 87.5 kg, SpO2 96 %. Body mass index is 34.17 kg/m.   Social History   Tobacco Use  Smoking Status Never  Smokeless  Tobacco Never   Tobacco Cessation:  N/A, patient does not currently use tobacco products   Blood Alcohol level:  Lab Results  Component Value Date   ETH <10 81/07/7508    Metabolic Disorder Labs:  Lab Results  Component Value Date   HGBA1C 5.4 07/19/2021   MPG 108.28 07/19/2021   MPG 119.76 12/13/2019   No results found for: PROLACTIN Lab Results  Component Value Date   CHOL 178 07/19/2021   TRIG 85 07/19/2021   HDL 49 07/19/2021   CHOLHDL 3.6 07/19/2021   VLDL 17 07/19/2021   LDLCALC 112 (H) 07/19/2021    See Psychiatric Specialty Exam and Suicide Risk Assessment completed by Attending Physician prior to discharge.  Discharge destination:  Home  Is patient on multiple antipsychotic therapies at discharge:  No   Has Patient had three or more failed trials of antipsychotic monotherapy by history:  Yes,   Antipsychotic medications that previously failed include:   1.  Abilify and Zyprexa.  Recommended Plan for Multiple Antipsychotic Therapies: NA   Allergies as of 08/06/2021       Reactions   Lisinopril Other (See Comments)   Prednisone    Had depression in 1991 after extended use of predisone. Pt has taken this medication since in a short time period, and has had no reaction.    Juniper Oil Rash   Penicillins Rash   Patient received ancef on 12/20/19 with no adverse reaction.        Medication List     STOP taking these medications    ARIPiprazole 5 MG tablet Commonly known as: ABILIFY   aspirin EC 325 MG tablet   HYDROcodone-acetaminophen 10-325 MG tablet Commonly known as: Norco   PARoxetine 20 MG tablet Commonly known as: PAXIL       TAKE these medications      Indication  albuterol 108 (90 Base) MCG/ACT inhaler Commonly known as: ProAir HFA Inhale 2 puffs into the lungs as needed (Shortness of breath). What changed: Another medication with the same name was removed. Continue taking this medication, and follow the directions you see here.     baclofen 10 MG tablet Commonly known as: LIORESAL Take 1 tablet (10 mg total) by mouth 3 (three) times daily. As needed for muscle spasm What changed: when  to take this    benztropine 1 MG tablet Commonly known as: COGENTIN Take 1 tablet (1 mg total) by mouth 2 (two) times daily at 8 am and 4 pm.    budesonide-formoterol 160-4.5 MCG/ACT inhaler Commonly known as: Symbicort Inhale 2 puffs into the lungs 2 (two) times daily.    calcitRIOL 0.25 MCG capsule Commonly known as: ROCALTROL Take 0.25 mcg by mouth daily.    CALTRATE 600+D PO Take 500 mg by mouth 2 (two) times daily.    carbamazepine 200 MG 12 hr tablet Commonly known as: TEGRETOL XR Take 1 tablet (200 mg total) by mouth at bedtime.    cetirizine 10 MG tablet Commonly known as: ZYRTEC Take 10 mg by mouth daily.    chlorproMAZINE 100 MG tablet Commonly known as: THORAZINE Take 1 tablet (100 mg total) by mouth at bedtime.    donepezil 5 MG tablet Commonly known as: ARICEPT Take 1 tablet (5 mg total) by mouth at bedtime.    gabapentin 100 MG capsule Commonly known as: NEURONTIN Take 100 mg by mouth 3 (three) times daily.    haloperidol 2 MG tablet Commonly known as: HALDOL Take 1 tablet (2 mg total) by mouth 2 (two) times daily at 8 am and 4 pm.    haloperidol decanoate 100 MG/ML injection Commonly known as: HALDOL DECANOATE Inject 0.5 mLs (50 mg total) into the muscle every 30 (thirty) days. Start taking on: August 29, 2021    hydrALAZINE 10 MG tablet Commonly known as: APRESOLINE Take 5 mg by mouth 3 (three) times daily.    levothyroxine 100 MCG tablet Commonly known as: SYNTHROID Take 100 mcg by mouth daily before breakfast.    losartan 100 MG tablet Commonly known as: COZAAR Take 0.5 tablets (50 mg total) by mouth daily.    mirtazapine 15 MG tablet Commonly known as: REMERON Take 1 tablet (15 mg total) by mouth at bedtime.    montelukast 10 MG tablet Commonly known as: SINGULAIR Take 10  mg by mouth at bedtime.    sennosides-docusate sodium 8.6-50 MG tablet Commonly known as: SENOKOT-S Take 2 tablets by mouth daily.         Follow-up Information     Chucky May, MD Follow up.   Specialty: Psychiatry Why: You have an appointment scheduled with Dr. Toy Care for Monday February 6th at 1:45pm. Notify practice if interested in virtual or in-person appointment. Please call to reschedule if needed. Thanks! Contact information: [address] [place] [address] [phone]                 Follow-up recommendations:  Dr. Elmarie Mainland is sent mail order due to expense   Signed: Parks Ranger, DO 08/06/2021, 11:00 AM

## 2021-08-06 NOTE — [facility] Suicide Risk Assessment (Signed)
[facility] Discharge Suicide Risk Assessment   Principal Problem: Major depressive disorder, single episode, severe with psychotic features [facility]) Discharge Diagnoses: Principal Problem:   Major depressive disorder, single episode, severe with psychotic features ([place]) Active Problems:   Memory impairment   Total Time spent with patient: 1 hour  Musculoskeletal: Strength & Muscle Tone: within normal limits Gait & Station: normal Patient leans: N/A  Psychiatric Specialty Exam  Presentation  General Appearance: Casual; Disheveled  Eye Contact:Fair  Speech:Pressured  Speech Volume:Increased  Handedness:No data recorded  Mood and Affect  Mood:Anxious  Duration of Depression Symptoms: Less than two weeks  Affect:Appropriate   Thought Process  Thought Processes:Disorganized  Descriptions of Associations:Loose  Orientation:Partial  Thought Content:Perseveration; Scattered  History of Schizophrenia/Schizoaffective disorder:No  Duration of Psychotic Symptoms:Greater than six months  Hallucinations:No data recorded Ideas of Reference:Delusions  Suicidal Thoughts:No data recorded Homicidal Thoughts:No data recorded  Sensorium  Memory:Immediate Poor; Remote Poor  Judgment:Fair  Insight:Fair   Executive Functions  Concentration:Fair  Attention Span:Fair  [place]   Psychomotor Activity  Psychomotor Activity:No data recorded  Assets  Assets:Communication Skills; Desire for Improvement; Financial Resources/Insurance; Housing; Social Support   Sleep  Sleep:No data recorded  Physical Exam: Physical Exam Vitals and nursing note reviewed.  Constitutional:      Appearance: Normal appearance. She is normal weight.  Neurological:     General: No focal deficit present.     Mental Status: She is alert and oriented to person, place, and time.  Psychiatric:        Attention and Perception: Attention and perception  normal.        Mood and Affect: Mood and affect normal.        Speech: Speech normal.        Behavior: Behavior normal. Behavior is cooperative.        Thought Content: Thought content normal.        Cognition and Memory: Cognition is impaired. Memory is impaired.        Judgment: Judgment normal.   Review of Systems  Constitutional: Negative.   HENT: Negative.    Eyes: Negative.   Respiratory: Negative.    Cardiovascular: Negative.   Gastrointestinal: Negative.   Genitourinary: Negative.   Musculoskeletal: Negative.   Skin: Negative.   Neurological: Negative.   Endo/Heme/Allergies: Negative.   Psychiatric/Behavioral:  Positive for memory loss.   Blood pressure 137/82, pulse 70, temperature 97.8 F (36.6 C), temperature source Oral, resp. rate 18, height 5\' 3"  (1.6 m), weight 87.5 kg, SpO2 96 %. Body mass index is 34.17 kg/m.  Mental Status Per Nursing Assessment::   On Admission:  NA  Demographic Factors:  Age 37 or older and Caucasian  Loss Factors: Decline in physical health  Historical Factors: NA  Risk Reduction Factors:   Living with another person, especially a relative  Continued Clinical Symptoms:  Previous Psychiatric Diagnoses and Treatments  Cognitive Features That Contribute To Risk:  Loss of executive function    Suicide Risk:  Minimal: No identifiable suicidal ideation.  Patients presenting with no risk factors but with morbid ruminations; may be classified as minimal risk based on the severity of the depressive symptoms   Follow-up Information     Chucky May, MD Follow up.   Specialty: Psychiatry Why: You have an appointment scheduled with Dr. Toy Care for Monday February 6th at 1:45pm. Notify practice if interested in virtual or in-person appointment. Please call to reschedule if needed. Thanks! Contact information: [address] [place] [address] [phone]                 Plan Of Care/Follow-up  recommendations: Dr. Talitha Givens, DO 08/06/2021, 10:44 AM

## 2021-08-07 DIAGNOSIS — J3089 Other allergic rhinitis: Secondary | ICD-10-CM | POA: Diagnosis not present

## 2021-08-07 DIAGNOSIS — J301 Allergic rhinitis due to pollen: Secondary | ICD-10-CM | POA: Diagnosis not present

## 2021-08-08 DIAGNOSIS — J301 Allergic rhinitis due to pollen: Secondary | ICD-10-CM | POA: Diagnosis not present

## 2021-08-08 DIAGNOSIS — J4540 Moderate persistent asthma, uncomplicated: Secondary | ICD-10-CM | POA: Diagnosis not present

## 2021-08-08 DIAGNOSIS — J3089 Other allergic rhinitis: Secondary | ICD-10-CM | POA: Diagnosis not present

## 2021-08-14 DIAGNOSIS — M25561 Pain in right knee: Secondary | ICD-10-CM | POA: Diagnosis not present

## 2021-08-14 DIAGNOSIS — M19012 Primary osteoarthritis, left shoulder: Secondary | ICD-10-CM | POA: Diagnosis not present

## 2021-08-14 DIAGNOSIS — M1712 Unilateral primary osteoarthritis, left knee: Secondary | ICD-10-CM | POA: Diagnosis not present

## 2021-08-15 DIAGNOSIS — E039 Hypothyroidism, unspecified: Secondary | ICD-10-CM | POA: Diagnosis not present

## 2021-08-15 DIAGNOSIS — I10 Essential (primary) hypertension: Secondary | ICD-10-CM | POA: Diagnosis not present

## 2021-08-15 DIAGNOSIS — J4540 Moderate persistent asthma, uncomplicated: Secondary | ICD-10-CM | POA: Diagnosis not present

## 2021-08-15 DIAGNOSIS — E782 Mixed hyperlipidemia: Secondary | ICD-10-CM | POA: Diagnosis not present

## 2021-08-16 DIAGNOSIS — J3089 Other allergic rhinitis: Secondary | ICD-10-CM | POA: Diagnosis not present

## 2021-08-16 DIAGNOSIS — J301 Allergic rhinitis due to pollen: Secondary | ICD-10-CM | POA: Diagnosis not present

## 2021-08-21 DIAGNOSIS — Z20822 Contact with and (suspected) exposure to covid-19: Secondary | ICD-10-CM | POA: Diagnosis not present

## 2021-08-22 DIAGNOSIS — H90A22 Sensorineural hearing loss, unilateral, left ear, with restricted hearing on the contralateral side: Secondary | ICD-10-CM | POA: Diagnosis not present

## 2021-08-22 DIAGNOSIS — H90A31 Mixed conductive and sensorineural hearing loss, unilateral, right ear with restricted hearing on the contralateral side: Secondary | ICD-10-CM | POA: Diagnosis not present

## 2021-08-30 DIAGNOSIS — J301 Allergic rhinitis due to pollen: Secondary | ICD-10-CM | POA: Diagnosis not present

## 2021-08-30 DIAGNOSIS — J3089 Other allergic rhinitis: Secondary | ICD-10-CM | POA: Diagnosis not present

## 2021-09-02 DIAGNOSIS — R269 Unspecified abnormalities of gait and mobility: Secondary | ICD-10-CM | POA: Diagnosis not present

## 2021-09-02 DIAGNOSIS — M6281 Muscle weakness (generalized): Secondary | ICD-10-CM | POA: Diagnosis not present

## 2021-09-02 DIAGNOSIS — R5381 Other malaise: Secondary | ICD-10-CM | POA: Diagnosis not present

## 2021-09-02 DIAGNOSIS — R293 Abnormal posture: Secondary | ICD-10-CM | POA: Diagnosis not present

## 2021-09-03 DIAGNOSIS — H5213 Myopia, bilateral: Secondary | ICD-10-CM | POA: Diagnosis not present

## 2021-09-03 DIAGNOSIS — H25013 Cortical age-related cataract, bilateral: Secondary | ICD-10-CM | POA: Diagnosis not present

## 2021-09-05 DIAGNOSIS — R293 Abnormal posture: Secondary | ICD-10-CM | POA: Diagnosis not present

## 2021-09-05 DIAGNOSIS — M6281 Muscle weakness (generalized): Secondary | ICD-10-CM | POA: Diagnosis not present

## 2021-09-05 DIAGNOSIS — R269 Unspecified abnormalities of gait and mobility: Secondary | ICD-10-CM | POA: Diagnosis not present

## 2021-09-05 DIAGNOSIS — R5381 Other malaise: Secondary | ICD-10-CM | POA: Diagnosis not present

## 2021-09-06 DIAGNOSIS — D485 Neoplasm of uncertain behavior of skin: Secondary | ICD-10-CM | POA: Diagnosis not present

## 2021-09-06 DIAGNOSIS — D225 Melanocytic nevi of trunk: Secondary | ICD-10-CM | POA: Diagnosis not present

## 2021-09-06 DIAGNOSIS — Z1283 Encounter for screening for malignant neoplasm of skin: Secondary | ICD-10-CM | POA: Diagnosis not present

## 2021-09-09 DIAGNOSIS — M6281 Muscle weakness (generalized): Secondary | ICD-10-CM | POA: Diagnosis not present

## 2021-09-09 DIAGNOSIS — R293 Abnormal posture: Secondary | ICD-10-CM | POA: Diagnosis not present

## 2021-09-09 DIAGNOSIS — R269 Unspecified abnormalities of gait and mobility: Secondary | ICD-10-CM | POA: Diagnosis not present

## 2021-09-09 DIAGNOSIS — R5381 Other malaise: Secondary | ICD-10-CM | POA: Diagnosis not present

## 2021-09-11 DIAGNOSIS — J301 Allergic rhinitis due to pollen: Secondary | ICD-10-CM | POA: Diagnosis not present

## 2021-09-11 DIAGNOSIS — J3089 Other allergic rhinitis: Secondary | ICD-10-CM | POA: Diagnosis not present

## 2021-09-12 DIAGNOSIS — R5381 Other malaise: Secondary | ICD-10-CM | POA: Diagnosis not present

## 2021-09-12 DIAGNOSIS — R269 Unspecified abnormalities of gait and mobility: Secondary | ICD-10-CM | POA: Diagnosis not present

## 2021-09-12 DIAGNOSIS — R293 Abnormal posture: Secondary | ICD-10-CM | POA: Diagnosis not present

## 2021-09-12 DIAGNOSIS — M6281 Muscle weakness (generalized): Secondary | ICD-10-CM | POA: Diagnosis not present

## 2021-09-14 DIAGNOSIS — J301 Allergic rhinitis due to pollen: Secondary | ICD-10-CM | POA: Diagnosis not present

## 2021-09-14 DIAGNOSIS — J3089 Other allergic rhinitis: Secondary | ICD-10-CM | POA: Diagnosis not present

## 2021-09-17 DIAGNOSIS — R269 Unspecified abnormalities of gait and mobility: Secondary | ICD-10-CM | POA: Diagnosis not present

## 2021-09-17 DIAGNOSIS — R5381 Other malaise: Secondary | ICD-10-CM | POA: Diagnosis not present

## 2021-09-17 DIAGNOSIS — R293 Abnormal posture: Secondary | ICD-10-CM | POA: Diagnosis not present

## 2021-09-17 DIAGNOSIS — M6281 Muscle weakness (generalized): Secondary | ICD-10-CM | POA: Diagnosis not present

## 2021-09-18 DIAGNOSIS — H029 Unspecified disorder of eyelid: Secondary | ICD-10-CM | POA: Diagnosis not present

## 2021-09-18 DIAGNOSIS — J3089 Other allergic rhinitis: Secondary | ICD-10-CM | POA: Diagnosis not present

## 2021-09-18 DIAGNOSIS — J301 Allergic rhinitis due to pollen: Secondary | ICD-10-CM | POA: Diagnosis not present

## 2021-09-18 DIAGNOSIS — E039 Hypothyroidism, unspecified: Secondary | ICD-10-CM | POA: Diagnosis not present

## 2021-09-18 DIAGNOSIS — E209 Hypoparathyroidism, unspecified: Secondary | ICD-10-CM | POA: Diagnosis not present

## 2021-09-19 DIAGNOSIS — R5381 Other malaise: Secondary | ICD-10-CM | POA: Diagnosis not present

## 2021-09-19 DIAGNOSIS — R269 Unspecified abnormalities of gait and mobility: Secondary | ICD-10-CM | POA: Diagnosis not present

## 2021-09-19 DIAGNOSIS — R293 Abnormal posture: Secondary | ICD-10-CM | POA: Diagnosis not present

## 2021-09-19 DIAGNOSIS — M6281 Muscle weakness (generalized): Secondary | ICD-10-CM | POA: Diagnosis not present

## 2021-09-25 DIAGNOSIS — Z1231 Encounter for screening mammogram for malignant neoplasm of breast: Secondary | ICD-10-CM | POA: Diagnosis not present

## 2021-09-25 DIAGNOSIS — R269 Unspecified abnormalities of gait and mobility: Secondary | ICD-10-CM | POA: Diagnosis not present

## 2021-09-25 DIAGNOSIS — R5381 Other malaise: Secondary | ICD-10-CM | POA: Diagnosis not present

## 2021-09-25 DIAGNOSIS — M6281 Muscle weakness (generalized): Secondary | ICD-10-CM | POA: Diagnosis not present

## 2021-09-25 DIAGNOSIS — R293 Abnormal posture: Secondary | ICD-10-CM | POA: Diagnosis not present

## 2021-09-30 DIAGNOSIS — R269 Unspecified abnormalities of gait and mobility: Secondary | ICD-10-CM | POA: Diagnosis not present

## 2021-09-30 DIAGNOSIS — M6281 Muscle weakness (generalized): Secondary | ICD-10-CM | POA: Diagnosis not present

## 2021-09-30 DIAGNOSIS — R5381 Other malaise: Secondary | ICD-10-CM | POA: Diagnosis not present

## 2021-09-30 DIAGNOSIS — R293 Abnormal posture: Secondary | ICD-10-CM | POA: Diagnosis not present

## 2021-10-01 DIAGNOSIS — H838X3 Other specified diseases of inner ear, bilateral: Secondary | ICD-10-CM | POA: Diagnosis not present

## 2021-10-01 DIAGNOSIS — H903 Sensorineural hearing loss, bilateral: Secondary | ICD-10-CM | POA: Diagnosis not present

## 2021-10-02 DIAGNOSIS — R269 Unspecified abnormalities of gait and mobility: Secondary | ICD-10-CM | POA: Diagnosis not present

## 2021-10-02 DIAGNOSIS — M6281 Muscle weakness (generalized): Secondary | ICD-10-CM | POA: Diagnosis not present

## 2021-10-02 DIAGNOSIS — R5381 Other malaise: Secondary | ICD-10-CM | POA: Diagnosis not present

## 2021-10-02 DIAGNOSIS — R293 Abnormal posture: Secondary | ICD-10-CM | POA: Diagnosis not present

## 2021-10-07 DIAGNOSIS — J301 Allergic rhinitis due to pollen: Secondary | ICD-10-CM | POA: Diagnosis not present

## 2021-10-07 DIAGNOSIS — J3089 Other allergic rhinitis: Secondary | ICD-10-CM | POA: Diagnosis not present

## 2021-10-09 DIAGNOSIS — J301 Allergic rhinitis due to pollen: Secondary | ICD-10-CM | POA: Diagnosis not present

## 2021-10-09 DIAGNOSIS — R5381 Other malaise: Secondary | ICD-10-CM | POA: Diagnosis not present

## 2021-10-09 DIAGNOSIS — R293 Abnormal posture: Secondary | ICD-10-CM | POA: Diagnosis not present

## 2021-10-09 DIAGNOSIS — J3081 Allergic rhinitis due to animal (cat) (dog) hair and dander: Secondary | ICD-10-CM | POA: Diagnosis not present

## 2021-10-09 DIAGNOSIS — J3089 Other allergic rhinitis: Secondary | ICD-10-CM | POA: Diagnosis not present

## 2021-10-09 DIAGNOSIS — R269 Unspecified abnormalities of gait and mobility: Secondary | ICD-10-CM | POA: Diagnosis not present

## 2021-10-09 DIAGNOSIS — M6281 Muscle weakness (generalized): Secondary | ICD-10-CM | POA: Diagnosis not present

## 2021-10-09 DIAGNOSIS — Z20822 Contact with and (suspected) exposure to covid-19: Secondary | ICD-10-CM | POA: Diagnosis not present

## 2021-10-11 DIAGNOSIS — R5381 Other malaise: Secondary | ICD-10-CM | POA: Diagnosis not present

## 2021-10-11 DIAGNOSIS — M6281 Muscle weakness (generalized): Secondary | ICD-10-CM | POA: Diagnosis not present

## 2021-10-11 DIAGNOSIS — R293 Abnormal posture: Secondary | ICD-10-CM | POA: Diagnosis not present

## 2021-10-11 DIAGNOSIS — R269 Unspecified abnormalities of gait and mobility: Secondary | ICD-10-CM | POA: Diagnosis not present

## 2021-10-14 DIAGNOSIS — M6281 Muscle weakness (generalized): Secondary | ICD-10-CM | POA: Diagnosis not present

## 2021-10-14 DIAGNOSIS — R5381 Other malaise: Secondary | ICD-10-CM | POA: Diagnosis not present

## 2021-10-14 DIAGNOSIS — R293 Abnormal posture: Secondary | ICD-10-CM | POA: Diagnosis not present

## 2021-10-14 DIAGNOSIS — R269 Unspecified abnormalities of gait and mobility: Secondary | ICD-10-CM | POA: Diagnosis not present

## 2021-10-16 DIAGNOSIS — J301 Allergic rhinitis due to pollen: Secondary | ICD-10-CM | POA: Diagnosis not present

## 2021-10-16 DIAGNOSIS — J3089 Other allergic rhinitis: Secondary | ICD-10-CM | POA: Diagnosis not present

## 2021-10-17 DIAGNOSIS — R7303 Prediabetes: Secondary | ICD-10-CM | POA: Diagnosis not present

## 2021-10-17 DIAGNOSIS — Z6835 Body mass index (BMI) 35.0-35.9, adult: Secondary | ICD-10-CM | POA: Diagnosis not present

## 2021-10-17 DIAGNOSIS — I10 Essential (primary) hypertension: Secondary | ICD-10-CM | POA: Diagnosis not present

## 2021-10-17 DIAGNOSIS — M1990 Unspecified osteoarthritis, unspecified site: Secondary | ICD-10-CM | POA: Diagnosis not present

## 2021-10-17 DIAGNOSIS — F332 Major depressive disorder, recurrent severe without psychotic features: Secondary | ICD-10-CM | POA: Diagnosis not present

## 2021-10-17 DIAGNOSIS — E039 Hypothyroidism, unspecified: Secondary | ICD-10-CM | POA: Diagnosis not present

## 2021-10-17 DIAGNOSIS — K219 Gastro-esophageal reflux disease without esophagitis: Secondary | ICD-10-CM | POA: Diagnosis not present

## 2021-10-17 DIAGNOSIS — J301 Allergic rhinitis due to pollen: Secondary | ICD-10-CM | POA: Diagnosis not present

## 2021-10-17 DIAGNOSIS — J4540 Moderate persistent asthma, uncomplicated: Secondary | ICD-10-CM | POA: Diagnosis not present

## 2021-10-17 DIAGNOSIS — E782 Mixed hyperlipidemia: Secondary | ICD-10-CM | POA: Diagnosis not present

## 2021-10-21 DIAGNOSIS — R269 Unspecified abnormalities of gait and mobility: Secondary | ICD-10-CM | POA: Diagnosis not present

## 2021-10-21 DIAGNOSIS — M6281 Muscle weakness (generalized): Secondary | ICD-10-CM | POA: Diagnosis not present

## 2021-10-21 DIAGNOSIS — R293 Abnormal posture: Secondary | ICD-10-CM | POA: Diagnosis not present

## 2021-10-21 DIAGNOSIS — R5381 Other malaise: Secondary | ICD-10-CM | POA: Diagnosis not present

## 2021-10-28 DIAGNOSIS — J3081 Allergic rhinitis due to animal (cat) (dog) hair and dander: Secondary | ICD-10-CM | POA: Diagnosis not present

## 2021-10-28 DIAGNOSIS — Z20828 Contact with and (suspected) exposure to other viral communicable diseases: Secondary | ICD-10-CM | POA: Diagnosis not present

## 2021-10-28 DIAGNOSIS — J3089 Other allergic rhinitis: Secondary | ICD-10-CM | POA: Diagnosis not present

## 2021-10-28 DIAGNOSIS — J301 Allergic rhinitis due to pollen: Secondary | ICD-10-CM | POA: Diagnosis not present

## 2021-10-29 DIAGNOSIS — R269 Unspecified abnormalities of gait and mobility: Secondary | ICD-10-CM | POA: Diagnosis not present

## 2021-10-29 DIAGNOSIS — M6281 Muscle weakness (generalized): Secondary | ICD-10-CM | POA: Diagnosis not present

## 2021-10-29 DIAGNOSIS — R293 Abnormal posture: Secondary | ICD-10-CM | POA: Diagnosis not present

## 2021-10-29 DIAGNOSIS — R5381 Other malaise: Secondary | ICD-10-CM | POA: Diagnosis not present

## 2021-11-05 DIAGNOSIS — Z20822 Contact with and (suspected) exposure to covid-19: Secondary | ICD-10-CM | POA: Diagnosis not present

## 2021-11-06 DIAGNOSIS — J3081 Allergic rhinitis due to animal (cat) (dog) hair and dander: Secondary | ICD-10-CM | POA: Diagnosis not present

## 2021-11-06 DIAGNOSIS — J3089 Other allergic rhinitis: Secondary | ICD-10-CM | POA: Diagnosis not present

## 2021-11-06 DIAGNOSIS — J301 Allergic rhinitis due to pollen: Secondary | ICD-10-CM | POA: Diagnosis not present

## 2021-11-15 DIAGNOSIS — Z20822 Contact with and (suspected) exposure to covid-19: Secondary | ICD-10-CM | POA: Diagnosis not present

## 2021-11-18 DIAGNOSIS — J301 Allergic rhinitis due to pollen: Secondary | ICD-10-CM | POA: Diagnosis not present

## 2021-11-18 DIAGNOSIS — J3089 Other allergic rhinitis: Secondary | ICD-10-CM | POA: Diagnosis not present

## 2021-11-19 DIAGNOSIS — Z23 Encounter for immunization: Secondary | ICD-10-CM | POA: Diagnosis not present

## 2021-11-19 DIAGNOSIS — E039 Hypothyroidism, unspecified: Secondary | ICD-10-CM | POA: Diagnosis not present

## 2021-12-02 DIAGNOSIS — J301 Allergic rhinitis due to pollen: Secondary | ICD-10-CM | POA: Diagnosis not present

## 2021-12-02 DIAGNOSIS — J3089 Other allergic rhinitis: Secondary | ICD-10-CM | POA: Diagnosis not present

## 2021-12-13 DIAGNOSIS — J3089 Other allergic rhinitis: Secondary | ICD-10-CM | POA: Diagnosis not present

## 2021-12-13 DIAGNOSIS — J301 Allergic rhinitis due to pollen: Secondary | ICD-10-CM | POA: Diagnosis not present

## 2021-12-30 DIAGNOSIS — J301 Allergic rhinitis due to pollen: Secondary | ICD-10-CM | POA: Diagnosis not present

## 2021-12-30 DIAGNOSIS — J3089 Other allergic rhinitis: Secondary | ICD-10-CM | POA: Diagnosis not present

## 2022-01-01 DIAGNOSIS — J301 Allergic rhinitis due to pollen: Secondary | ICD-10-CM | POA: Diagnosis not present

## 2022-01-01 DIAGNOSIS — J3089 Other allergic rhinitis: Secondary | ICD-10-CM | POA: Diagnosis not present

## 2022-01-09 DIAGNOSIS — J3081 Allergic rhinitis due to animal (cat) (dog) hair and dander: Secondary | ICD-10-CM | POA: Diagnosis not present

## 2022-01-09 DIAGNOSIS — J301 Allergic rhinitis due to pollen: Secondary | ICD-10-CM | POA: Diagnosis not present

## 2022-01-09 DIAGNOSIS — J3089 Other allergic rhinitis: Secondary | ICD-10-CM | POA: Diagnosis not present

## 2022-01-10 ENCOUNTER — Other Ambulatory Visit (HOSPITAL_BASED_OUTPATIENT_CLINIC_OR_DEPARTMENT_OTHER): Payer: Self-pay

## 2022-01-10 DIAGNOSIS — L304 Erythema intertrigo: Secondary | ICD-10-CM | POA: Diagnosis not present

## 2022-01-10 DIAGNOSIS — L218 Other seborrheic dermatitis: Secondary | ICD-10-CM | POA: Diagnosis not present

## 2022-01-10 DIAGNOSIS — D485 Neoplasm of uncertain behavior of skin: Secondary | ICD-10-CM | POA: Diagnosis not present

## 2022-01-10 MED ORDER — KETOCONAZOLE 2 % EX CREA
TOPICAL_CREAM | CUTANEOUS | 3 refills | Status: DC
  Filled 2022-01-10: qty 60, 30d supply, fill #0

## 2022-01-15 DIAGNOSIS — J3089 Other allergic rhinitis: Secondary | ICD-10-CM | POA: Diagnosis not present

## 2022-01-15 DIAGNOSIS — J301 Allergic rhinitis due to pollen: Secondary | ICD-10-CM | POA: Diagnosis not present

## 2022-01-21 DIAGNOSIS — D124 Benign neoplasm of descending colon: Secondary | ICD-10-CM | POA: Diagnosis not present

## 2022-01-21 DIAGNOSIS — D122 Benign neoplasm of ascending colon: Secondary | ICD-10-CM | POA: Diagnosis not present

## 2022-01-21 DIAGNOSIS — Z86010 Personal history of colonic polyps: Secondary | ICD-10-CM | POA: Diagnosis not present

## 2022-01-21 DIAGNOSIS — D128 Benign neoplasm of rectum: Secondary | ICD-10-CM | POA: Diagnosis not present

## 2022-01-21 DIAGNOSIS — Z09 Encounter for follow-up examination after completed treatment for conditions other than malignant neoplasm: Secondary | ICD-10-CM | POA: Diagnosis not present

## 2022-01-22 DIAGNOSIS — Z0000 Encounter for general adult medical examination without abnormal findings: Secondary | ICD-10-CM | POA: Diagnosis not present

## 2022-01-22 DIAGNOSIS — Z1389 Encounter for screening for other disorder: Secondary | ICD-10-CM | POA: Diagnosis not present

## 2022-01-22 DIAGNOSIS — Z6837 Body mass index (BMI) 37.0-37.9, adult: Secondary | ICD-10-CM | POA: Diagnosis not present

## 2022-01-23 DIAGNOSIS — D128 Benign neoplasm of rectum: Secondary | ICD-10-CM | POA: Diagnosis not present

## 2022-01-28 DIAGNOSIS — J3089 Other allergic rhinitis: Secondary | ICD-10-CM | POA: Diagnosis not present

## 2022-01-28 DIAGNOSIS — J3081 Allergic rhinitis due to animal (cat) (dog) hair and dander: Secondary | ICD-10-CM | POA: Diagnosis not present

## 2022-01-28 DIAGNOSIS — J301 Allergic rhinitis due to pollen: Secondary | ICD-10-CM | POA: Diagnosis not present

## 2022-02-10 DIAGNOSIS — J3089 Other allergic rhinitis: Secondary | ICD-10-CM | POA: Diagnosis not present

## 2022-02-10 DIAGNOSIS — J301 Allergic rhinitis due to pollen: Secondary | ICD-10-CM | POA: Diagnosis not present

## 2022-02-13 DIAGNOSIS — J4540 Moderate persistent asthma, uncomplicated: Secondary | ICD-10-CM | POA: Diagnosis not present

## 2022-02-13 DIAGNOSIS — I10 Essential (primary) hypertension: Secondary | ICD-10-CM | POA: Diagnosis not present

## 2022-02-13 DIAGNOSIS — E782 Mixed hyperlipidemia: Secondary | ICD-10-CM | POA: Diagnosis not present

## 2022-02-20 DIAGNOSIS — J3081 Allergic rhinitis due to animal (cat) (dog) hair and dander: Secondary | ICD-10-CM | POA: Diagnosis not present

## 2022-02-20 DIAGNOSIS — J3089 Other allergic rhinitis: Secondary | ICD-10-CM | POA: Diagnosis not present

## 2022-02-20 DIAGNOSIS — J301 Allergic rhinitis due to pollen: Secondary | ICD-10-CM | POA: Diagnosis not present

## 2022-03-04 DIAGNOSIS — J3089 Other allergic rhinitis: Secondary | ICD-10-CM | POA: Diagnosis not present

## 2022-03-04 DIAGNOSIS — J3081 Allergic rhinitis due to animal (cat) (dog) hair and dander: Secondary | ICD-10-CM | POA: Diagnosis not present

## 2022-03-04 DIAGNOSIS — J301 Allergic rhinitis due to pollen: Secondary | ICD-10-CM | POA: Diagnosis not present

## 2022-03-19 DIAGNOSIS — J3081 Allergic rhinitis due to animal (cat) (dog) hair and dander: Secondary | ICD-10-CM | POA: Diagnosis not present

## 2022-03-19 DIAGNOSIS — J301 Allergic rhinitis due to pollen: Secondary | ICD-10-CM | POA: Diagnosis not present

## 2022-03-19 DIAGNOSIS — J3089 Other allergic rhinitis: Secondary | ICD-10-CM | POA: Diagnosis not present

## 2022-03-20 DIAGNOSIS — E209 Hypoparathyroidism, unspecified: Secondary | ICD-10-CM | POA: Diagnosis not present

## 2022-03-20 DIAGNOSIS — E039 Hypothyroidism, unspecified: Secondary | ICD-10-CM | POA: Diagnosis not present

## 2022-03-31 DIAGNOSIS — J3089 Other allergic rhinitis: Secondary | ICD-10-CM | POA: Diagnosis not present

## 2022-03-31 DIAGNOSIS — J301 Allergic rhinitis due to pollen: Secondary | ICD-10-CM | POA: Diagnosis not present

## 2022-04-04 DIAGNOSIS — Z23 Encounter for immunization: Secondary | ICD-10-CM | POA: Diagnosis not present

## 2022-04-07 DIAGNOSIS — J3089 Other allergic rhinitis: Secondary | ICD-10-CM | POA: Diagnosis not present

## 2022-04-07 DIAGNOSIS — J4540 Moderate persistent asthma, uncomplicated: Secondary | ICD-10-CM | POA: Diagnosis not present

## 2022-04-07 DIAGNOSIS — J301 Allergic rhinitis due to pollen: Secondary | ICD-10-CM | POA: Diagnosis not present

## 2022-04-09 DIAGNOSIS — G4733 Obstructive sleep apnea (adult) (pediatric): Secondary | ICD-10-CM | POA: Diagnosis not present

## 2022-04-09 DIAGNOSIS — I10 Essential (primary) hypertension: Secondary | ICD-10-CM | POA: Diagnosis not present

## 2022-04-17 ENCOUNTER — Other Ambulatory Visit (HOSPITAL_BASED_OUTPATIENT_CLINIC_OR_DEPARTMENT_OTHER): Payer: Self-pay

## 2022-04-17 DIAGNOSIS — I10 Essential (primary) hypertension: Secondary | ICD-10-CM | POA: Diagnosis not present

## 2022-04-17 DIAGNOSIS — G629 Polyneuropathy, unspecified: Secondary | ICD-10-CM | POA: Diagnosis not present

## 2022-04-17 MED ORDER — AREXVY 120 MCG/0.5ML IM SUSR
INTRAMUSCULAR | 0 refills | Status: DC
  Filled 2022-04-17: qty 0.5, 1d supply, fill #0

## 2022-04-22 ENCOUNTER — Other Ambulatory Visit (HOSPITAL_BASED_OUTPATIENT_CLINIC_OR_DEPARTMENT_OTHER): Payer: Self-pay

## 2022-04-22 ENCOUNTER — Other Ambulatory Visit: Payer: Self-pay | Admitting: Family Medicine

## 2022-04-22 ENCOUNTER — Other Ambulatory Visit (HOSPITAL_BASED_OUTPATIENT_CLINIC_OR_DEPARTMENT_OTHER): Payer: Self-pay | Admitting: Family Medicine

## 2022-04-22 DIAGNOSIS — M545 Low back pain, unspecified: Secondary | ICD-10-CM | POA: Diagnosis not present

## 2022-04-22 MED ORDER — METHOCARBAMOL 500 MG PO TABS
750.0000 mg | ORAL_TABLET | ORAL | 0 refills | Status: DC
  Filled 2022-04-22: qty 30, 4d supply, fill #0

## 2022-04-23 ENCOUNTER — Ambulatory Visit (HOSPITAL_BASED_OUTPATIENT_CLINIC_OR_DEPARTMENT_OTHER)
Admission: RE | Admit: 2022-04-23 | Discharge: 2022-04-23 | Disposition: A | Discharge status: Home / Self Care | Payer: Medicare Other | Source: Ambulatory Visit | Attending: Family Medicine | Admitting: Family Medicine

## 2022-04-23 DIAGNOSIS — M545 Low back pain, unspecified: Secondary | ICD-10-CM | POA: Insufficient documentation

## 2022-04-23 DIAGNOSIS — J3089 Other allergic rhinitis: Secondary | ICD-10-CM | POA: Diagnosis not present

## 2022-04-23 DIAGNOSIS — J301 Allergic rhinitis due to pollen: Secondary | ICD-10-CM | POA: Diagnosis not present

## 2022-04-23 DIAGNOSIS — M1611 Unilateral primary osteoarthritis, right hip: Secondary | ICD-10-CM | POA: Diagnosis not present

## 2022-04-23 DIAGNOSIS — R319 Hematuria, unspecified: Secondary | ICD-10-CM | POA: Diagnosis not present

## 2022-04-30 DIAGNOSIS — J301 Allergic rhinitis due to pollen: Secondary | ICD-10-CM | POA: Diagnosis not present

## 2022-04-30 DIAGNOSIS — J3089 Other allergic rhinitis: Secondary | ICD-10-CM | POA: Diagnosis not present

## 2022-05-05 DIAGNOSIS — J301 Allergic rhinitis due to pollen: Secondary | ICD-10-CM | POA: Diagnosis not present

## 2022-05-05 DIAGNOSIS — J3081 Allergic rhinitis due to animal (cat) (dog) hair and dander: Secondary | ICD-10-CM | POA: Diagnosis not present

## 2022-05-05 DIAGNOSIS — J3089 Other allergic rhinitis: Secondary | ICD-10-CM | POA: Diagnosis not present

## 2022-05-12 DIAGNOSIS — M1712 Unilateral primary osteoarthritis, left knee: Secondary | ICD-10-CM | POA: Diagnosis not present

## 2022-05-19 DIAGNOSIS — J301 Allergic rhinitis due to pollen: Secondary | ICD-10-CM | POA: Diagnosis not present

## 2022-05-19 DIAGNOSIS — J3081 Allergic rhinitis due to animal (cat) (dog) hair and dander: Secondary | ICD-10-CM | POA: Diagnosis not present

## 2022-05-19 DIAGNOSIS — J3089 Other allergic rhinitis: Secondary | ICD-10-CM | POA: Diagnosis not present

## 2022-06-09 DIAGNOSIS — J301 Allergic rhinitis due to pollen: Secondary | ICD-10-CM | POA: Diagnosis not present

## 2022-06-09 DIAGNOSIS — J3089 Other allergic rhinitis: Secondary | ICD-10-CM | POA: Diagnosis not present

## 2022-06-09 DIAGNOSIS — J3081 Allergic rhinitis due to animal (cat) (dog) hair and dander: Secondary | ICD-10-CM | POA: Diagnosis not present

## 2022-06-18 DIAGNOSIS — R3981 Functional urinary incontinence: Secondary | ICD-10-CM | POA: Diagnosis not present

## 2022-06-18 DIAGNOSIS — R2689 Other abnormalities of gait and mobility: Secondary | ICD-10-CM | POA: Diagnosis not present

## 2022-06-24 DIAGNOSIS — J3089 Other allergic rhinitis: Secondary | ICD-10-CM | POA: Diagnosis not present

## 2022-06-24 DIAGNOSIS — J301 Allergic rhinitis due to pollen: Secondary | ICD-10-CM | POA: Diagnosis not present

## 2022-06-24 DIAGNOSIS — J3081 Allergic rhinitis due to animal (cat) (dog) hair and dander: Secondary | ICD-10-CM | POA: Diagnosis not present

## 2022-06-30 DIAGNOSIS — M19012 Primary osteoarthritis, left shoulder: Secondary | ICD-10-CM | POA: Diagnosis not present

## 2022-07-08 DIAGNOSIS — J3089 Other allergic rhinitis: Secondary | ICD-10-CM | POA: Diagnosis not present

## 2022-07-08 DIAGNOSIS — J3081 Allergic rhinitis due to animal (cat) (dog) hair and dander: Secondary | ICD-10-CM | POA: Diagnosis not present

## 2022-07-08 DIAGNOSIS — J301 Allergic rhinitis due to pollen: Secondary | ICD-10-CM | POA: Diagnosis not present

## 2022-07-09 DIAGNOSIS — M6281 Muscle weakness (generalized): Secondary | ICD-10-CM | POA: Diagnosis not present

## 2022-07-17 DIAGNOSIS — M6281 Muscle weakness (generalized): Secondary | ICD-10-CM | POA: Diagnosis not present

## 2022-07-21 DIAGNOSIS — M6281 Muscle weakness (generalized): Secondary | ICD-10-CM | POA: Diagnosis not present

## 2022-07-23 DIAGNOSIS — J301 Allergic rhinitis due to pollen: Secondary | ICD-10-CM | POA: Diagnosis not present

## 2022-07-23 DIAGNOSIS — J3081 Allergic rhinitis due to animal (cat) (dog) hair and dander: Secondary | ICD-10-CM | POA: Diagnosis not present

## 2022-07-23 DIAGNOSIS — J3089 Other allergic rhinitis: Secondary | ICD-10-CM | POA: Diagnosis not present

## 2022-07-24 DIAGNOSIS — M6281 Muscle weakness (generalized): Secondary | ICD-10-CM | POA: Diagnosis not present

## 2022-07-28 DIAGNOSIS — N393 Stress incontinence (female) (male): Secondary | ICD-10-CM | POA: Diagnosis not present

## 2022-07-28 DIAGNOSIS — N8183 Incompetence or weakening of rectovaginal tissue: Secondary | ICD-10-CM | POA: Diagnosis not present

## 2022-07-28 DIAGNOSIS — Z87898 Personal history of other specified conditions: Secondary | ICD-10-CM | POA: Diagnosis not present

## 2022-07-28 DIAGNOSIS — N816 Rectocele: Secondary | ICD-10-CM | POA: Diagnosis not present

## 2022-07-28 DIAGNOSIS — N3941 Urge incontinence: Secondary | ICD-10-CM | POA: Diagnosis not present

## 2022-07-29 DIAGNOSIS — U071 COVID-19: Secondary | ICD-10-CM | POA: Diagnosis not present

## 2022-08-04 DIAGNOSIS — M6281 Muscle weakness (generalized): Secondary | ICD-10-CM | POA: Diagnosis not present

## 2022-08-06 DIAGNOSIS — M6281 Muscle weakness (generalized): Secondary | ICD-10-CM | POA: Diagnosis not present

## 2022-08-11 DIAGNOSIS — M19012 Primary osteoarthritis, left shoulder: Secondary | ICD-10-CM | POA: Diagnosis not present

## 2022-08-12 DIAGNOSIS — J301 Allergic rhinitis due to pollen: Secondary | ICD-10-CM | POA: Diagnosis not present

## 2022-08-12 DIAGNOSIS — J3089 Other allergic rhinitis: Secondary | ICD-10-CM | POA: Diagnosis not present

## 2022-08-12 DIAGNOSIS — J3081 Allergic rhinitis due to animal (cat) (dog) hair and dander: Secondary | ICD-10-CM | POA: Diagnosis not present

## 2022-08-13 DIAGNOSIS — M6281 Muscle weakness (generalized): Secondary | ICD-10-CM | POA: Diagnosis not present

## 2022-08-15 DIAGNOSIS — M6281 Muscle weakness (generalized): Secondary | ICD-10-CM | POA: Diagnosis not present

## 2022-08-21 IMAGING — DX DG CHEST 1V PORT
1 series · 2 of 2 positions shown · non-contrast
Comparison: Chest x-ray 07/10/2017.

CLINICAL DATA: Possible overdose.

EXAM:
PORTABLE CHEST 1 VIEW

[Series 1: chest · 0.14mm/px · 2 of 2 slices shown]
[im 1/2]
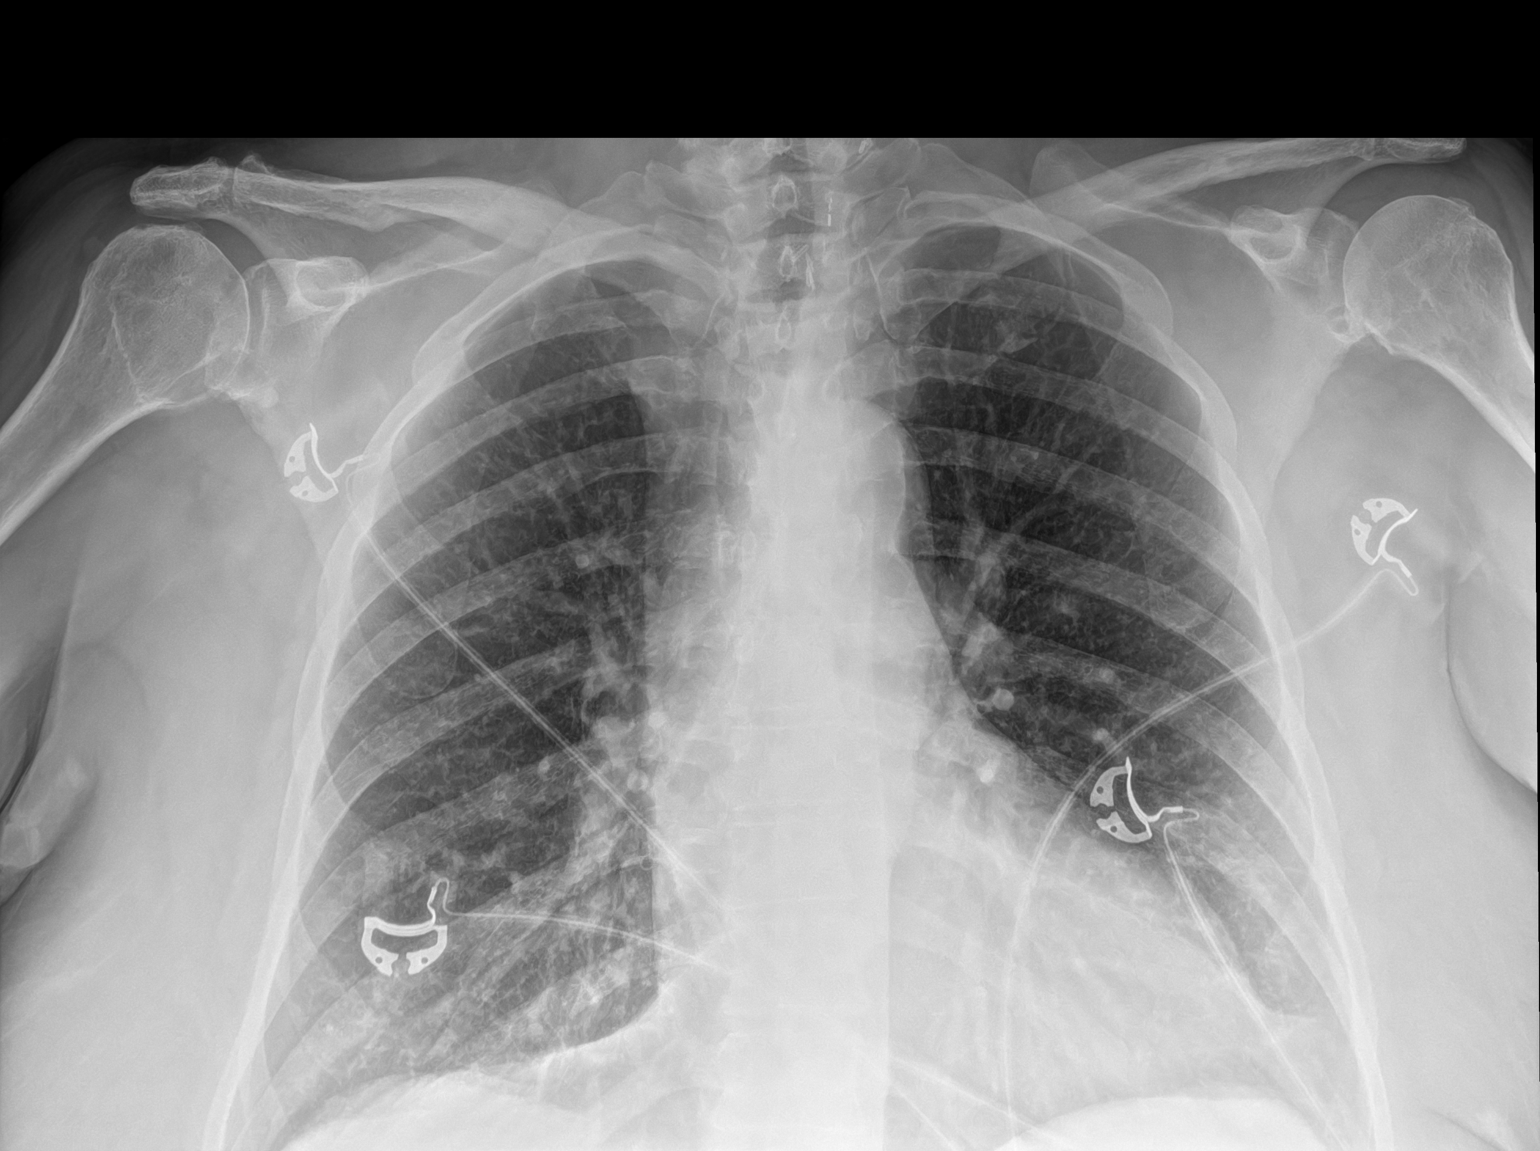
[im 2/2]
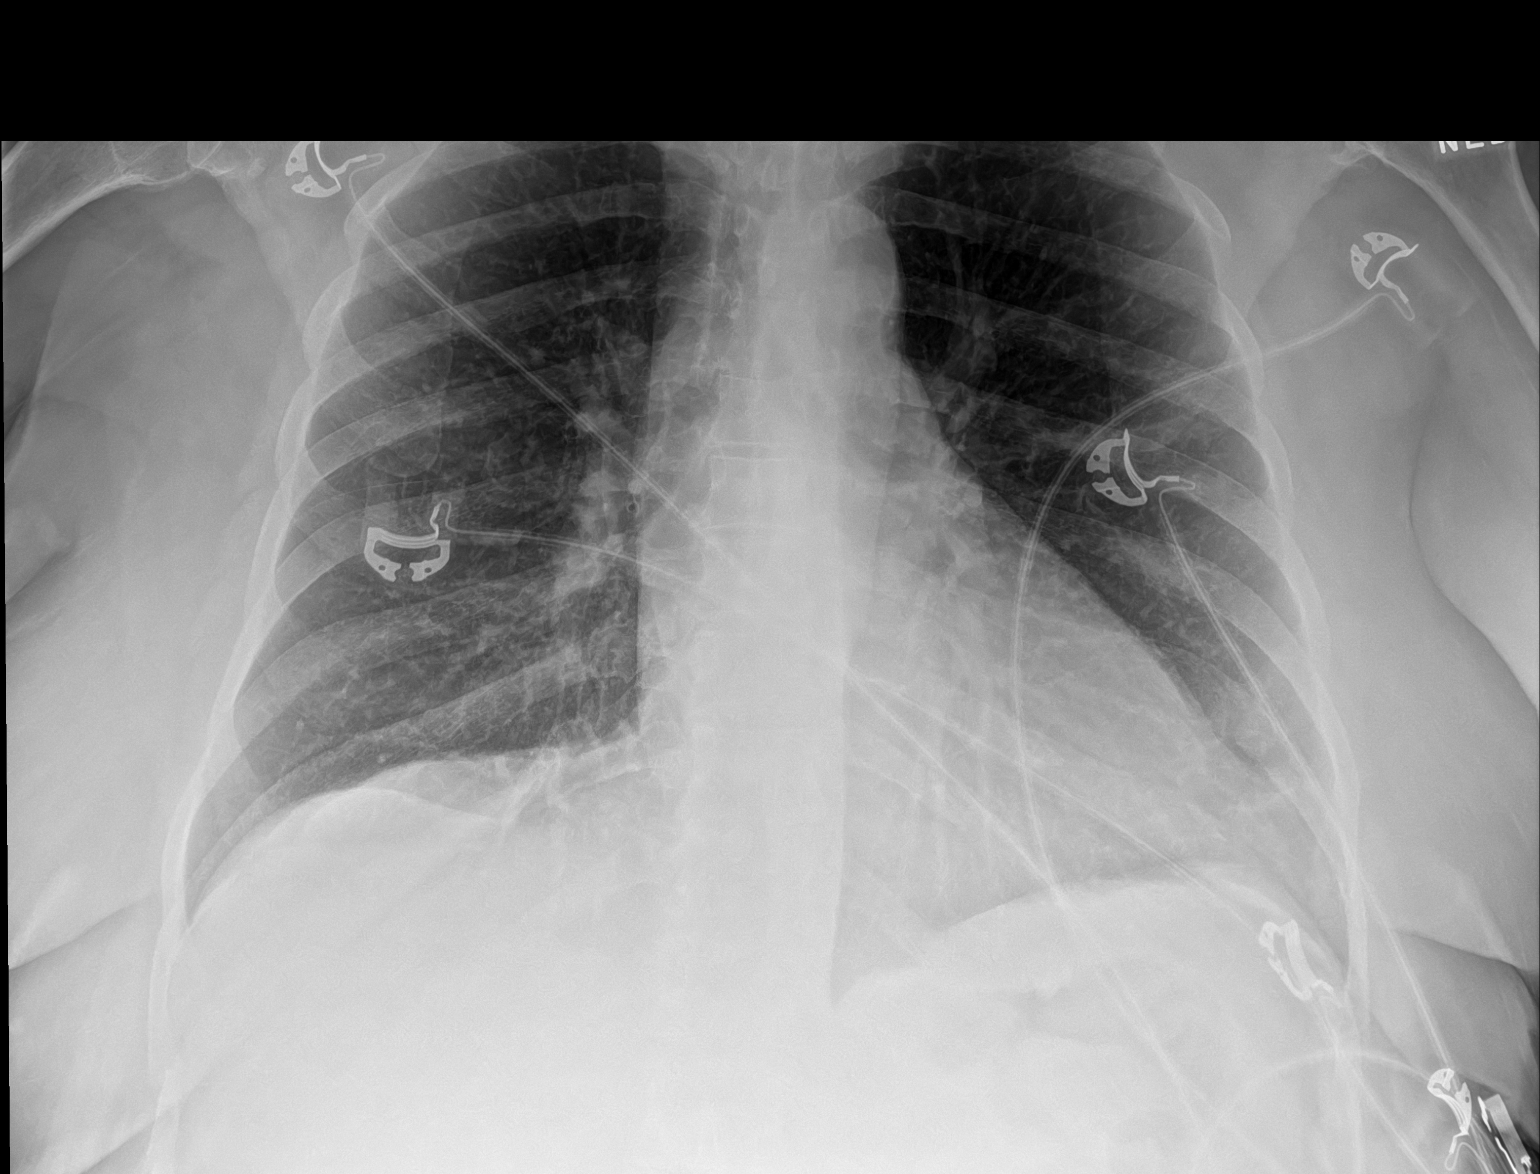

[2 of 2 positions shown; findings below may reference images not displayed]

FINDINGS: The heart size and mediastinal contours are within normal limits.
Both lungs are clear. The visualized skeletal structures are
unremarkable.
IMPRESSION: No active disease.

## 2022-08-28 DIAGNOSIS — J3089 Other allergic rhinitis: Secondary | ICD-10-CM | POA: Diagnosis not present

## 2022-08-28 DIAGNOSIS — J301 Allergic rhinitis due to pollen: Secondary | ICD-10-CM | POA: Diagnosis not present

## 2022-08-28 DIAGNOSIS — J3081 Allergic rhinitis due to animal (cat) (dog) hair and dander: Secondary | ICD-10-CM | POA: Diagnosis not present

## 2022-08-28 DIAGNOSIS — M6281 Muscle weakness (generalized): Secondary | ICD-10-CM | POA: Diagnosis not present

## 2022-09-02 DIAGNOSIS — J3089 Other allergic rhinitis: Secondary | ICD-10-CM | POA: Diagnosis not present

## 2022-09-02 DIAGNOSIS — J3081 Allergic rhinitis due to animal (cat) (dog) hair and dander: Secondary | ICD-10-CM | POA: Diagnosis not present

## 2022-09-02 DIAGNOSIS — J301 Allergic rhinitis due to pollen: Secondary | ICD-10-CM | POA: Diagnosis not present

## 2022-09-04 DIAGNOSIS — M6281 Muscle weakness (generalized): Secondary | ICD-10-CM | POA: Diagnosis not present

## 2022-09-08 DIAGNOSIS — M6281 Muscle weakness (generalized): Secondary | ICD-10-CM | POA: Diagnosis not present

## 2022-09-10 DIAGNOSIS — M6281 Muscle weakness (generalized): Secondary | ICD-10-CM | POA: Diagnosis not present

## 2022-09-10 DIAGNOSIS — H2513 Age-related nuclear cataract, bilateral: Secondary | ICD-10-CM | POA: Diagnosis not present

## 2022-09-10 DIAGNOSIS — J3081 Allergic rhinitis due to animal (cat) (dog) hair and dander: Secondary | ICD-10-CM | POA: Diagnosis not present

## 2022-09-10 DIAGNOSIS — J301 Allergic rhinitis due to pollen: Secondary | ICD-10-CM | POA: Diagnosis not present

## 2022-09-10 DIAGNOSIS — H02834 Dermatochalasis of left upper eyelid: Secondary | ICD-10-CM | POA: Diagnosis not present

## 2022-09-10 DIAGNOSIS — H02831 Dermatochalasis of right upper eyelid: Secondary | ICD-10-CM | POA: Diagnosis not present

## 2022-09-10 DIAGNOSIS — H5213 Myopia, bilateral: Secondary | ICD-10-CM | POA: Diagnosis not present

## 2022-09-10 DIAGNOSIS — H524 Presbyopia: Secondary | ICD-10-CM | POA: Diagnosis not present

## 2022-09-10 DIAGNOSIS — J3089 Other allergic rhinitis: Secondary | ICD-10-CM | POA: Diagnosis not present

## 2022-09-15 DIAGNOSIS — J301 Allergic rhinitis due to pollen: Secondary | ICD-10-CM | POA: Diagnosis not present

## 2022-09-15 DIAGNOSIS — J3089 Other allergic rhinitis: Secondary | ICD-10-CM | POA: Diagnosis not present

## 2022-09-15 DIAGNOSIS — J3081 Allergic rhinitis due to animal (cat) (dog) hair and dander: Secondary | ICD-10-CM | POA: Diagnosis not present

## 2022-09-17 DIAGNOSIS — H029 Unspecified disorder of eyelid: Secondary | ICD-10-CM | POA: Diagnosis not present

## 2022-09-17 DIAGNOSIS — E039 Hypothyroidism, unspecified: Secondary | ICD-10-CM | POA: Diagnosis not present

## 2022-09-17 DIAGNOSIS — E209 Hypoparathyroidism, unspecified: Secondary | ICD-10-CM | POA: Diagnosis not present

## 2022-09-17 DIAGNOSIS — N3941 Urge incontinence: Secondary | ICD-10-CM | POA: Diagnosis not present

## 2022-09-18 DIAGNOSIS — M6281 Muscle weakness (generalized): Secondary | ICD-10-CM | POA: Diagnosis not present

## 2022-09-22 DIAGNOSIS — M1712 Unilateral primary osteoarthritis, left knee: Secondary | ICD-10-CM | POA: Diagnosis not present

## 2022-09-24 DIAGNOSIS — M6281 Muscle weakness (generalized): Secondary | ICD-10-CM | POA: Diagnosis not present

## 2022-09-26 DIAGNOSIS — M6281 Muscle weakness (generalized): Secondary | ICD-10-CM | POA: Diagnosis not present

## 2022-10-02 DIAGNOSIS — J3089 Other allergic rhinitis: Secondary | ICD-10-CM | POA: Diagnosis not present

## 2022-10-02 DIAGNOSIS — J3081 Allergic rhinitis due to animal (cat) (dog) hair and dander: Secondary | ICD-10-CM | POA: Diagnosis not present

## 2022-10-02 DIAGNOSIS — J301 Allergic rhinitis due to pollen: Secondary | ICD-10-CM | POA: Diagnosis not present

## 2022-10-02 DIAGNOSIS — M6281 Muscle weakness (generalized): Secondary | ICD-10-CM | POA: Diagnosis not present

## 2022-10-06 DIAGNOSIS — M6281 Muscle weakness (generalized): Secondary | ICD-10-CM | POA: Diagnosis not present

## 2022-10-08 DIAGNOSIS — J3081 Allergic rhinitis due to animal (cat) (dog) hair and dander: Secondary | ICD-10-CM | POA: Diagnosis not present

## 2022-10-08 DIAGNOSIS — J301 Allergic rhinitis due to pollen: Secondary | ICD-10-CM | POA: Diagnosis not present

## 2022-10-08 DIAGNOSIS — M6281 Muscle weakness (generalized): Secondary | ICD-10-CM | POA: Diagnosis not present

## 2022-10-08 DIAGNOSIS — J3089 Other allergic rhinitis: Secondary | ICD-10-CM | POA: Diagnosis not present

## 2022-10-15 DIAGNOSIS — M6281 Muscle weakness (generalized): Secondary | ICD-10-CM | POA: Diagnosis not present

## 2022-10-16 DIAGNOSIS — M6281 Muscle weakness (generalized): Secondary | ICD-10-CM | POA: Diagnosis not present

## 2022-10-22 DIAGNOSIS — Z1231 Encounter for screening mammogram for malignant neoplasm of breast: Secondary | ICD-10-CM | POA: Diagnosis not present

## 2022-10-22 DIAGNOSIS — Z780 Asymptomatic menopausal state: Secondary | ICD-10-CM | POA: Diagnosis not present

## 2022-10-23 DIAGNOSIS — J301 Allergic rhinitis due to pollen: Secondary | ICD-10-CM | POA: Diagnosis not present

## 2022-10-23 DIAGNOSIS — J3081 Allergic rhinitis due to animal (cat) (dog) hair and dander: Secondary | ICD-10-CM | POA: Diagnosis not present

## 2022-10-23 DIAGNOSIS — J3089 Other allergic rhinitis: Secondary | ICD-10-CM | POA: Diagnosis not present

## 2022-10-24 DIAGNOSIS — M6281 Muscle weakness (generalized): Secondary | ICD-10-CM | POA: Diagnosis not present

## 2022-10-27 DIAGNOSIS — N3941 Urge incontinence: Secondary | ICD-10-CM | POA: Diagnosis not present

## 2022-10-27 DIAGNOSIS — N393 Stress incontinence (female) (male): Secondary | ICD-10-CM | POA: Diagnosis not present

## 2022-10-28 DIAGNOSIS — M6281 Muscle weakness (generalized): Secondary | ICD-10-CM | POA: Diagnosis not present

## 2022-10-29 DIAGNOSIS — I10 Essential (primary) hypertension: Secondary | ICD-10-CM | POA: Diagnosis not present

## 2022-10-29 DIAGNOSIS — E782 Mixed hyperlipidemia: Secondary | ICD-10-CM | POA: Diagnosis not present

## 2022-10-31 DIAGNOSIS — M6281 Muscle weakness (generalized): Secondary | ICD-10-CM | POA: Diagnosis not present

## 2022-11-03 DIAGNOSIS — J3089 Other allergic rhinitis: Secondary | ICD-10-CM | POA: Diagnosis not present

## 2022-11-03 DIAGNOSIS — M6281 Muscle weakness (generalized): Secondary | ICD-10-CM | POA: Diagnosis not present

## 2022-11-03 DIAGNOSIS — J301 Allergic rhinitis due to pollen: Secondary | ICD-10-CM | POA: Diagnosis not present

## 2022-11-03 DIAGNOSIS — J3081 Allergic rhinitis due to animal (cat) (dog) hair and dander: Secondary | ICD-10-CM | POA: Diagnosis not present

## 2022-11-04 DIAGNOSIS — K219 Gastro-esophageal reflux disease without esophagitis: Secondary | ICD-10-CM | POA: Diagnosis not present

## 2022-11-04 DIAGNOSIS — I10 Essential (primary) hypertension: Secondary | ICD-10-CM | POA: Diagnosis not present

## 2022-11-04 DIAGNOSIS — E039 Hypothyroidism, unspecified: Secondary | ICD-10-CM | POA: Diagnosis not present

## 2022-11-04 DIAGNOSIS — E782 Mixed hyperlipidemia: Secondary | ICD-10-CM | POA: Diagnosis not present

## 2022-11-04 DIAGNOSIS — J4540 Moderate persistent asthma, uncomplicated: Secondary | ICD-10-CM | POA: Diagnosis not present

## 2022-11-05 DIAGNOSIS — M6281 Muscle weakness (generalized): Secondary | ICD-10-CM | POA: Diagnosis not present

## 2022-11-06 DIAGNOSIS — J301 Allergic rhinitis due to pollen: Secondary | ICD-10-CM | POA: Diagnosis not present

## 2022-11-06 DIAGNOSIS — J3089 Other allergic rhinitis: Secondary | ICD-10-CM | POA: Diagnosis not present

## 2022-11-06 DIAGNOSIS — J4540 Moderate persistent asthma, uncomplicated: Secondary | ICD-10-CM | POA: Diagnosis not present

## 2022-11-17 DIAGNOSIS — J3089 Other allergic rhinitis: Secondary | ICD-10-CM | POA: Diagnosis not present

## 2022-11-17 DIAGNOSIS — J3081 Allergic rhinitis due to animal (cat) (dog) hair and dander: Secondary | ICD-10-CM | POA: Diagnosis not present

## 2022-11-17 DIAGNOSIS — J301 Allergic rhinitis due to pollen: Secondary | ICD-10-CM | POA: Diagnosis not present

## 2022-12-01 DIAGNOSIS — J3089 Other allergic rhinitis: Secondary | ICD-10-CM | POA: Diagnosis not present

## 2022-12-01 DIAGNOSIS — J301 Allergic rhinitis due to pollen: Secondary | ICD-10-CM | POA: Diagnosis not present

## 2022-12-01 DIAGNOSIS — J3081 Allergic rhinitis due to animal (cat) (dog) hair and dander: Secondary | ICD-10-CM | POA: Diagnosis not present

## 2022-12-05 DIAGNOSIS — M1712 Unilateral primary osteoarthritis, left knee: Secondary | ICD-10-CM | POA: Diagnosis not present

## 2022-12-15 DIAGNOSIS — J3081 Allergic rhinitis due to animal (cat) (dog) hair and dander: Secondary | ICD-10-CM | POA: Diagnosis not present

## 2022-12-15 DIAGNOSIS — J301 Allergic rhinitis due to pollen: Secondary | ICD-10-CM | POA: Diagnosis not present

## 2022-12-15 DIAGNOSIS — J3089 Other allergic rhinitis: Secondary | ICD-10-CM | POA: Diagnosis not present

## 2022-12-22 DIAGNOSIS — J301 Allergic rhinitis due to pollen: Secondary | ICD-10-CM | POA: Diagnosis not present

## 2022-12-22 DIAGNOSIS — J3089 Other allergic rhinitis: Secondary | ICD-10-CM | POA: Diagnosis not present

## 2022-12-22 DIAGNOSIS — J3081 Allergic rhinitis due to animal (cat) (dog) hair and dander: Secondary | ICD-10-CM | POA: Diagnosis not present

## 2022-12-29 DIAGNOSIS — J3081 Allergic rhinitis due to animal (cat) (dog) hair and dander: Secondary | ICD-10-CM | POA: Diagnosis not present

## 2022-12-29 DIAGNOSIS — H02413 Mechanical ptosis of bilateral eyelids: Secondary | ICD-10-CM | POA: Diagnosis not present

## 2022-12-29 DIAGNOSIS — J3089 Other allergic rhinitis: Secondary | ICD-10-CM | POA: Diagnosis not present

## 2022-12-29 DIAGNOSIS — J301 Allergic rhinitis due to pollen: Secondary | ICD-10-CM | POA: Diagnosis not present

## 2022-12-29 DIAGNOSIS — H0279 Other degenerative disorders of eyelid and periocular area: Secondary | ICD-10-CM | POA: Diagnosis not present

## 2022-12-29 DIAGNOSIS — H02411 Mechanical ptosis of right eyelid: Secondary | ICD-10-CM | POA: Diagnosis not present

## 2022-12-29 DIAGNOSIS — H02423 Myogenic ptosis of bilateral eyelids: Secondary | ICD-10-CM | POA: Diagnosis not present

## 2022-12-29 DIAGNOSIS — H53483 Generalized contraction of visual field, bilateral: Secondary | ICD-10-CM | POA: Diagnosis not present

## 2022-12-29 DIAGNOSIS — H02834 Dermatochalasis of left upper eyelid: Secondary | ICD-10-CM | POA: Diagnosis not present

## 2022-12-29 DIAGNOSIS — Z01818 Encounter for other preprocedural examination: Secondary | ICD-10-CM | POA: Diagnosis not present

## 2022-12-29 DIAGNOSIS — H02422 Myogenic ptosis of left eyelid: Secondary | ICD-10-CM | POA: Diagnosis not present

## 2022-12-29 DIAGNOSIS — H57813 Brow ptosis, bilateral: Secondary | ICD-10-CM | POA: Diagnosis not present

## 2022-12-29 DIAGNOSIS — H02831 Dermatochalasis of right upper eyelid: Secondary | ICD-10-CM | POA: Diagnosis not present

## 2022-12-29 DIAGNOSIS — H02412 Mechanical ptosis of left eyelid: Secondary | ICD-10-CM | POA: Diagnosis not present

## 2022-12-29 DIAGNOSIS — H02421 Myogenic ptosis of right eyelid: Secondary | ICD-10-CM | POA: Diagnosis not present

## 2023-01-08 DIAGNOSIS — H6121 Impacted cerumen, right ear: Secondary | ICD-10-CM | POA: Diagnosis not present

## 2023-01-12 DIAGNOSIS — J3081 Allergic rhinitis due to animal (cat) (dog) hair and dander: Secondary | ICD-10-CM | POA: Diagnosis not present

## 2023-01-12 DIAGNOSIS — J3089 Other allergic rhinitis: Secondary | ICD-10-CM | POA: Diagnosis not present

## 2023-01-12 DIAGNOSIS — J301 Allergic rhinitis due to pollen: Secondary | ICD-10-CM | POA: Diagnosis not present

## 2023-01-19 DIAGNOSIS — H53483 Generalized contraction of visual field, bilateral: Secondary | ICD-10-CM | POA: Diagnosis not present

## 2023-01-23 DIAGNOSIS — J3081 Allergic rhinitis due to animal (cat) (dog) hair and dander: Secondary | ICD-10-CM | POA: Diagnosis not present

## 2023-01-23 DIAGNOSIS — J3089 Other allergic rhinitis: Secondary | ICD-10-CM | POA: Diagnosis not present

## 2023-01-23 DIAGNOSIS — J301 Allergic rhinitis due to pollen: Secondary | ICD-10-CM | POA: Diagnosis not present

## 2023-01-28 DIAGNOSIS — Z0000 Encounter for general adult medical examination without abnormal findings: Secondary | ICD-10-CM | POA: Diagnosis not present

## 2023-01-28 DIAGNOSIS — Z23 Encounter for immunization: Secondary | ICD-10-CM | POA: Diagnosis not present

## 2023-01-28 DIAGNOSIS — Z1389 Encounter for screening for other disorder: Secondary | ICD-10-CM | POA: Diagnosis not present

## 2023-01-29 DIAGNOSIS — E782 Mixed hyperlipidemia: Secondary | ICD-10-CM | POA: Diagnosis not present

## 2023-01-29 DIAGNOSIS — R590 Localized enlarged lymph nodes: Secondary | ICD-10-CM | POA: Diagnosis not present

## 2023-01-29 DIAGNOSIS — I10 Essential (primary) hypertension: Secondary | ICD-10-CM | POA: Diagnosis not present

## 2023-01-29 DIAGNOSIS — Z6836 Body mass index (BMI) 36.0-36.9, adult: Secondary | ICD-10-CM | POA: Diagnosis not present

## 2023-01-30 ENCOUNTER — Other Ambulatory Visit: Payer: Self-pay | Admitting: Family Medicine

## 2023-01-30 DIAGNOSIS — R590 Localized enlarged lymph nodes: Secondary | ICD-10-CM

## 2023-02-04 DIAGNOSIS — J3081 Allergic rhinitis due to animal (cat) (dog) hair and dander: Secondary | ICD-10-CM | POA: Diagnosis not present

## 2023-02-04 DIAGNOSIS — J301 Allergic rhinitis due to pollen: Secondary | ICD-10-CM | POA: Diagnosis not present

## 2023-02-04 DIAGNOSIS — J3089 Other allergic rhinitis: Secondary | ICD-10-CM | POA: Diagnosis not present

## 2023-02-05 DIAGNOSIS — N63 Unspecified lump in unspecified breast: Secondary | ICD-10-CM | POA: Diagnosis not present

## 2023-02-05 DIAGNOSIS — R599 Enlarged lymph nodes, unspecified: Secondary | ICD-10-CM | POA: Diagnosis not present

## 2023-02-10 ENCOUNTER — Other Ambulatory Visit: Payer: Self-pay | Admitting: Radiology

## 2023-02-10 DIAGNOSIS — L7682 Other postprocedural complications of skin and subcutaneous tissue: Secondary | ICD-10-CM | POA: Diagnosis not present

## 2023-02-10 DIAGNOSIS — N6331 Unspecified lump in axillary tail of the right breast: Secondary | ICD-10-CM | POA: Diagnosis not present

## 2023-02-10 DIAGNOSIS — L720 Epidermal cyst: Secondary | ICD-10-CM | POA: Diagnosis not present

## 2023-02-10 DIAGNOSIS — R922 Inconclusive mammogram: Secondary | ICD-10-CM | POA: Diagnosis not present

## 2023-02-13 DIAGNOSIS — L089 Local infection of the skin and subcutaneous tissue, unspecified: Secondary | ICD-10-CM | POA: Diagnosis not present

## 2023-02-15 DIAGNOSIS — L0291 Cutaneous abscess, unspecified: Secondary | ICD-10-CM | POA: Diagnosis not present

## 2023-02-15 DIAGNOSIS — L02413 Cutaneous abscess of right upper limb: Secondary | ICD-10-CM | POA: Diagnosis not present

## 2023-02-16 DIAGNOSIS — J3081 Allergic rhinitis due to animal (cat) (dog) hair and dander: Secondary | ICD-10-CM | POA: Diagnosis not present

## 2023-02-16 DIAGNOSIS — J301 Allergic rhinitis due to pollen: Secondary | ICD-10-CM | POA: Diagnosis not present

## 2023-02-16 DIAGNOSIS — J3089 Other allergic rhinitis: Secondary | ICD-10-CM | POA: Diagnosis not present

## 2023-02-23 DIAGNOSIS — N3941 Urge incontinence: Secondary | ICD-10-CM | POA: Diagnosis not present

## 2023-03-03 DIAGNOSIS — J301 Allergic rhinitis due to pollen: Secondary | ICD-10-CM | POA: Diagnosis not present

## 2023-03-03 DIAGNOSIS — J3089 Other allergic rhinitis: Secondary | ICD-10-CM | POA: Diagnosis not present

## 2023-03-17 DIAGNOSIS — J3081 Allergic rhinitis due to animal (cat) (dog) hair and dander: Secondary | ICD-10-CM | POA: Diagnosis not present

## 2023-03-17 DIAGNOSIS — J301 Allergic rhinitis due to pollen: Secondary | ICD-10-CM | POA: Diagnosis not present

## 2023-03-17 DIAGNOSIS — J3089 Other allergic rhinitis: Secondary | ICD-10-CM | POA: Diagnosis not present

## 2023-03-18 DIAGNOSIS — J3089 Other allergic rhinitis: Secondary | ICD-10-CM | POA: Diagnosis not present

## 2023-03-18 DIAGNOSIS — J301 Allergic rhinitis due to pollen: Secondary | ICD-10-CM | POA: Diagnosis not present

## 2023-03-20 DIAGNOSIS — E209 Hypoparathyroidism, unspecified: Secondary | ICD-10-CM | POA: Diagnosis not present

## 2023-03-20 DIAGNOSIS — M1712 Unilateral primary osteoarthritis, left knee: Secondary | ICD-10-CM | POA: Diagnosis not present

## 2023-03-23 ENCOUNTER — Other Ambulatory Visit (HOSPITAL_BASED_OUTPATIENT_CLINIC_OR_DEPARTMENT_OTHER): Payer: Self-pay

## 2023-03-23 DIAGNOSIS — Z23 Encounter for immunization: Secondary | ICD-10-CM | POA: Diagnosis not present

## 2023-03-23 MED ORDER — INFLUENZA VAC A&B SURF ANT ADJ 0.5 ML IM SUSY
0.5000 mL | PREFILLED_SYRINGE | Freq: Once | INTRAMUSCULAR | 0 refills | Status: AC
  Filled 2023-03-23: qty 0.5, 1d supply, fill #0

## 2023-03-23 MED ORDER — COMIRNATY 30 MCG/0.3ML IM SUSY
0.3000 mL | PREFILLED_SYRINGE | INTRAMUSCULAR | 0 refills | Status: DC
  Filled 2023-03-23: qty 0.3, 1d supply, fill #0

## 2023-03-30 DIAGNOSIS — N3941 Urge incontinence: Secondary | ICD-10-CM | POA: Diagnosis not present

## 2023-03-31 DIAGNOSIS — J3081 Allergic rhinitis due to animal (cat) (dog) hair and dander: Secondary | ICD-10-CM | POA: Diagnosis not present

## 2023-03-31 DIAGNOSIS — J301 Allergic rhinitis due to pollen: Secondary | ICD-10-CM | POA: Diagnosis not present

## 2023-03-31 DIAGNOSIS — J3089 Other allergic rhinitis: Secondary | ICD-10-CM | POA: Diagnosis not present

## 2023-04-07 DIAGNOSIS — H02423 Myogenic ptosis of bilateral eyelids: Secondary | ICD-10-CM | POA: Diagnosis not present

## 2023-04-07 DIAGNOSIS — H02831 Dermatochalasis of right upper eyelid: Secondary | ICD-10-CM | POA: Diagnosis not present

## 2023-04-07 DIAGNOSIS — H53483 Generalized contraction of visual field, bilateral: Secondary | ICD-10-CM | POA: Diagnosis not present

## 2023-04-07 DIAGNOSIS — H02411 Mechanical ptosis of right eyelid: Secondary | ICD-10-CM | POA: Diagnosis not present

## 2023-04-07 DIAGNOSIS — H02422 Myogenic ptosis of left eyelid: Secondary | ICD-10-CM | POA: Diagnosis not present

## 2023-04-07 DIAGNOSIS — H02412 Mechanical ptosis of left eyelid: Secondary | ICD-10-CM | POA: Diagnosis not present

## 2023-04-07 DIAGNOSIS — H0279 Other degenerative disorders of eyelid and periocular area: Secondary | ICD-10-CM | POA: Diagnosis not present

## 2023-04-07 DIAGNOSIS — H02421 Myogenic ptosis of right eyelid: Secondary | ICD-10-CM | POA: Diagnosis not present

## 2023-04-07 DIAGNOSIS — H57813 Brow ptosis, bilateral: Secondary | ICD-10-CM | POA: Diagnosis not present

## 2023-04-07 DIAGNOSIS — H02413 Mechanical ptosis of bilateral eyelids: Secondary | ICD-10-CM | POA: Diagnosis not present

## 2023-04-07 DIAGNOSIS — Z01818 Encounter for other preprocedural examination: Secondary | ICD-10-CM | POA: Diagnosis not present

## 2023-04-07 DIAGNOSIS — H02834 Dermatochalasis of left upper eyelid: Secondary | ICD-10-CM | POA: Diagnosis not present

## 2023-04-13 DIAGNOSIS — J301 Allergic rhinitis due to pollen: Secondary | ICD-10-CM | POA: Diagnosis not present

## 2023-04-13 DIAGNOSIS — J3081 Allergic rhinitis due to animal (cat) (dog) hair and dander: Secondary | ICD-10-CM | POA: Diagnosis not present

## 2023-04-13 DIAGNOSIS — J3089 Other allergic rhinitis: Secondary | ICD-10-CM | POA: Diagnosis not present

## 2023-04-22 DIAGNOSIS — I10 Essential (primary) hypertension: Secondary | ICD-10-CM | POA: Diagnosis not present

## 2023-04-22 DIAGNOSIS — G4733 Obstructive sleep apnea (adult) (pediatric): Secondary | ICD-10-CM | POA: Diagnosis not present

## 2023-04-28 DIAGNOSIS — J301 Allergic rhinitis due to pollen: Secondary | ICD-10-CM | POA: Diagnosis not present

## 2023-04-28 DIAGNOSIS — J3081 Allergic rhinitis due to animal (cat) (dog) hair and dander: Secondary | ICD-10-CM | POA: Diagnosis not present

## 2023-04-28 DIAGNOSIS — J3089 Other allergic rhinitis: Secondary | ICD-10-CM | POA: Diagnosis not present

## 2023-05-13 DIAGNOSIS — J301 Allergic rhinitis due to pollen: Secondary | ICD-10-CM | POA: Diagnosis not present

## 2023-05-13 DIAGNOSIS — J3089 Other allergic rhinitis: Secondary | ICD-10-CM | POA: Diagnosis not present

## 2023-05-27 DIAGNOSIS — J301 Allergic rhinitis due to pollen: Secondary | ICD-10-CM | POA: Diagnosis not present

## 2023-05-27 DIAGNOSIS — M1712 Unilateral primary osteoarthritis, left knee: Secondary | ICD-10-CM | POA: Diagnosis not present

## 2023-05-27 DIAGNOSIS — J3089 Other allergic rhinitis: Secondary | ICD-10-CM | POA: Diagnosis not present

## 2023-05-27 DIAGNOSIS — J3081 Allergic rhinitis due to animal (cat) (dog) hair and dander: Secondary | ICD-10-CM | POA: Diagnosis not present

## 2023-06-01 DIAGNOSIS — J3089 Other allergic rhinitis: Secondary | ICD-10-CM | POA: Diagnosis not present

## 2023-06-01 DIAGNOSIS — J3081 Allergic rhinitis due to animal (cat) (dog) hair and dander: Secondary | ICD-10-CM | POA: Diagnosis not present

## 2023-06-01 DIAGNOSIS — J301 Allergic rhinitis due to pollen: Secondary | ICD-10-CM | POA: Diagnosis not present

## 2023-06-08 DIAGNOSIS — J3089 Other allergic rhinitis: Secondary | ICD-10-CM | POA: Diagnosis not present

## 2023-06-08 DIAGNOSIS — J3081 Allergic rhinitis due to animal (cat) (dog) hair and dander: Secondary | ICD-10-CM | POA: Diagnosis not present

## 2023-06-08 DIAGNOSIS — J301 Allergic rhinitis due to pollen: Secondary | ICD-10-CM | POA: Diagnosis not present

## 2023-06-15 DIAGNOSIS — J3089 Other allergic rhinitis: Secondary | ICD-10-CM | POA: Diagnosis not present

## 2023-06-15 DIAGNOSIS — J301 Allergic rhinitis due to pollen: Secondary | ICD-10-CM | POA: Diagnosis not present

## 2023-06-15 DIAGNOSIS — J3081 Allergic rhinitis due to animal (cat) (dog) hair and dander: Secondary | ICD-10-CM | POA: Diagnosis not present

## 2023-06-24 DIAGNOSIS — J3089 Other allergic rhinitis: Secondary | ICD-10-CM | POA: Diagnosis not present

## 2023-06-24 DIAGNOSIS — J3081 Allergic rhinitis due to animal (cat) (dog) hair and dander: Secondary | ICD-10-CM | POA: Diagnosis not present

## 2023-06-24 DIAGNOSIS — J301 Allergic rhinitis due to pollen: Secondary | ICD-10-CM | POA: Diagnosis not present

## 2023-06-24 DIAGNOSIS — M1712 Unilateral primary osteoarthritis, left knee: Secondary | ICD-10-CM | POA: Diagnosis not present

## 2023-06-26 DIAGNOSIS — M1712 Unilateral primary osteoarthritis, left knee: Secondary | ICD-10-CM | POA: Diagnosis not present

## 2023-06-30 DIAGNOSIS — J189 Pneumonia, unspecified organism: Secondary | ICD-10-CM | POA: Diagnosis not present

## 2023-06-30 DIAGNOSIS — J22 Unspecified acute lower respiratory infection: Secondary | ICD-10-CM | POA: Diagnosis not present

## 2023-07-02 DIAGNOSIS — J189 Pneumonia, unspecified organism: Secondary | ICD-10-CM | POA: Diagnosis not present

## 2023-07-03 DIAGNOSIS — M1712 Unilateral primary osteoarthritis, left knee: Secondary | ICD-10-CM | POA: Diagnosis not present

## 2023-07-09 DIAGNOSIS — J3089 Other allergic rhinitis: Secondary | ICD-10-CM | POA: Diagnosis not present

## 2023-07-09 DIAGNOSIS — J301 Allergic rhinitis due to pollen: Secondary | ICD-10-CM | POA: Diagnosis not present

## 2023-07-10 DIAGNOSIS — M1712 Unilateral primary osteoarthritis, left knee: Secondary | ICD-10-CM | POA: Diagnosis not present

## 2023-07-17 DIAGNOSIS — J301 Allergic rhinitis due to pollen: Secondary | ICD-10-CM | POA: Diagnosis not present

## 2023-07-17 DIAGNOSIS — J3081 Allergic rhinitis due to animal (cat) (dog) hair and dander: Secondary | ICD-10-CM | POA: Diagnosis not present

## 2023-07-17 DIAGNOSIS — J3089 Other allergic rhinitis: Secondary | ICD-10-CM | POA: Diagnosis not present

## 2023-07-20 DIAGNOSIS — R52 Pain, unspecified: Secondary | ICD-10-CM | POA: Diagnosis not present

## 2023-07-20 DIAGNOSIS — H57813 Brow ptosis, bilateral: Secondary | ICD-10-CM | POA: Diagnosis not present

## 2023-07-20 DIAGNOSIS — R051 Acute cough: Secondary | ICD-10-CM | POA: Diagnosis not present

## 2023-07-20 DIAGNOSIS — J209 Acute bronchitis, unspecified: Secondary | ICD-10-CM | POA: Diagnosis not present

## 2023-07-20 DIAGNOSIS — Z8709 Personal history of other diseases of the respiratory system: Secondary | ICD-10-CM | POA: Diagnosis not present

## 2023-07-20 DIAGNOSIS — H0279 Other degenerative disorders of eyelid and periocular area: Secondary | ICD-10-CM | POA: Diagnosis not present

## 2023-07-20 DIAGNOSIS — H02834 Dermatochalasis of left upper eyelid: Secondary | ICD-10-CM | POA: Diagnosis not present

## 2023-07-20 DIAGNOSIS — J101 Influenza due to other identified influenza virus with other respiratory manifestations: Secondary | ICD-10-CM | POA: Diagnosis not present

## 2023-07-20 DIAGNOSIS — H02831 Dermatochalasis of right upper eyelid: Secondary | ICD-10-CM | POA: Diagnosis not present

## 2023-07-20 DIAGNOSIS — Z09 Encounter for follow-up examination after completed treatment for conditions other than malignant neoplasm: Secondary | ICD-10-CM | POA: Diagnosis not present

## 2023-07-30 DIAGNOSIS — J301 Allergic rhinitis due to pollen: Secondary | ICD-10-CM | POA: Diagnosis not present

## 2023-07-30 DIAGNOSIS — J3089 Other allergic rhinitis: Secondary | ICD-10-CM | POA: Diagnosis not present

## 2023-07-30 DIAGNOSIS — J3081 Allergic rhinitis due to animal (cat) (dog) hair and dander: Secondary | ICD-10-CM | POA: Diagnosis not present

## 2023-08-05 DIAGNOSIS — J3081 Allergic rhinitis due to animal (cat) (dog) hair and dander: Secondary | ICD-10-CM | POA: Diagnosis not present

## 2023-08-05 DIAGNOSIS — J3089 Other allergic rhinitis: Secondary | ICD-10-CM | POA: Diagnosis not present

## 2023-08-05 DIAGNOSIS — J301 Allergic rhinitis due to pollen: Secondary | ICD-10-CM | POA: Diagnosis not present

## 2023-08-06 DIAGNOSIS — F339 Major depressive disorder, recurrent, unspecified: Secondary | ICD-10-CM | POA: Diagnosis not present

## 2023-08-06 DIAGNOSIS — E782 Mixed hyperlipidemia: Secondary | ICD-10-CM | POA: Diagnosis not present

## 2023-08-06 DIAGNOSIS — E039 Hypothyroidism, unspecified: Secondary | ICD-10-CM | POA: Diagnosis not present

## 2023-08-06 DIAGNOSIS — I10 Essential (primary) hypertension: Secondary | ICD-10-CM | POA: Diagnosis not present

## 2023-08-10 DIAGNOSIS — I10 Essential (primary) hypertension: Secondary | ICD-10-CM | POA: Diagnosis not present

## 2023-08-10 DIAGNOSIS — G4733 Obstructive sleep apnea (adult) (pediatric): Secondary | ICD-10-CM | POA: Diagnosis not present

## 2023-08-17 DIAGNOSIS — J301 Allergic rhinitis due to pollen: Secondary | ICD-10-CM | POA: Diagnosis not present

## 2023-08-17 DIAGNOSIS — J3089 Other allergic rhinitis: Secondary | ICD-10-CM | POA: Diagnosis not present

## 2023-08-17 DIAGNOSIS — J3081 Allergic rhinitis due to animal (cat) (dog) hair and dander: Secondary | ICD-10-CM | POA: Diagnosis not present

## 2023-08-28 DIAGNOSIS — M1712 Unilateral primary osteoarthritis, left knee: Secondary | ICD-10-CM | POA: Diagnosis not present

## 2023-08-28 DIAGNOSIS — M25572 Pain in left ankle and joints of left foot: Secondary | ICD-10-CM | POA: Diagnosis not present

## 2023-08-31 ENCOUNTER — Ambulatory Visit (HOSPITAL_COMMUNITY)
Admission: RE | Admit: 2023-08-31 | Discharge: 2023-08-31 | Disposition: A | Discharge status: Home / Self Care | Payer: Medicare Other | Source: Ambulatory Visit | Attending: Cardiovascular Disease | Admitting: Cardiovascular Disease

## 2023-08-31 ENCOUNTER — Other Ambulatory Visit (HOSPITAL_COMMUNITY): Payer: Self-pay | Admitting: Orthopedic Surgery

## 2023-08-31 DIAGNOSIS — M7989 Other specified soft tissue disorders: Secondary | ICD-10-CM | POA: Insufficient documentation

## 2023-08-31 DIAGNOSIS — J3089 Other allergic rhinitis: Secondary | ICD-10-CM | POA: Diagnosis not present

## 2023-08-31 DIAGNOSIS — J301 Allergic rhinitis due to pollen: Secondary | ICD-10-CM | POA: Diagnosis not present

## 2023-08-31 DIAGNOSIS — J3081 Allergic rhinitis due to animal (cat) (dog) hair and dander: Secondary | ICD-10-CM | POA: Diagnosis not present

## 2023-08-31 DIAGNOSIS — M79605 Pain in left leg: Secondary | ICD-10-CM | POA: Insufficient documentation

## 2023-09-01 DIAGNOSIS — M25862 Other specified joint disorders, left knee: Secondary | ICD-10-CM | POA: Diagnosis not present

## 2023-09-01 DIAGNOSIS — Z01818 Encounter for other preprocedural examination: Secondary | ICD-10-CM | POA: Diagnosis not present

## 2023-09-01 DIAGNOSIS — M25562 Pain in left knee: Secondary | ICD-10-CM | POA: Diagnosis not present

## 2023-09-01 DIAGNOSIS — Z01812 Encounter for preprocedural laboratory examination: Secondary | ICD-10-CM | POA: Diagnosis not present

## 2023-09-07 DIAGNOSIS — N3941 Urge incontinence: Secondary | ICD-10-CM | POA: Diagnosis not present

## 2023-09-07 DIAGNOSIS — N3281 Overactive bladder: Secondary | ICD-10-CM | POA: Diagnosis not present

## 2023-09-11 DIAGNOSIS — J301 Allergic rhinitis due to pollen: Secondary | ICD-10-CM | POA: Diagnosis not present

## 2023-09-11 DIAGNOSIS — J3081 Allergic rhinitis due to animal (cat) (dog) hair and dander: Secondary | ICD-10-CM | POA: Diagnosis not present

## 2023-09-11 DIAGNOSIS — J3089 Other allergic rhinitis: Secondary | ICD-10-CM | POA: Diagnosis not present

## 2023-09-23 DIAGNOSIS — J3081 Allergic rhinitis due to animal (cat) (dog) hair and dander: Secondary | ICD-10-CM | POA: Diagnosis not present

## 2023-09-23 DIAGNOSIS — J301 Allergic rhinitis due to pollen: Secondary | ICD-10-CM | POA: Diagnosis not present

## 2023-09-23 DIAGNOSIS — J3089 Other allergic rhinitis: Secondary | ICD-10-CM | POA: Diagnosis not present

## 2023-10-07 DIAGNOSIS — J3081 Allergic rhinitis due to animal (cat) (dog) hair and dander: Secondary | ICD-10-CM | POA: Diagnosis not present

## 2023-10-07 DIAGNOSIS — J301 Allergic rhinitis due to pollen: Secondary | ICD-10-CM | POA: Diagnosis not present

## 2023-10-07 DIAGNOSIS — J3089 Other allergic rhinitis: Secondary | ICD-10-CM | POA: Diagnosis not present

## 2023-10-13 DIAGNOSIS — J301 Allergic rhinitis due to pollen: Secondary | ICD-10-CM | POA: Diagnosis not present

## 2023-10-13 DIAGNOSIS — J3089 Other allergic rhinitis: Secondary | ICD-10-CM | POA: Diagnosis not present

## 2023-10-13 DIAGNOSIS — J4540 Moderate persistent asthma, uncomplicated: Secondary | ICD-10-CM | POA: Diagnosis not present

## 2023-10-20 DIAGNOSIS — J3081 Allergic rhinitis due to animal (cat) (dog) hair and dander: Secondary | ICD-10-CM | POA: Diagnosis not present

## 2023-10-20 DIAGNOSIS — J3089 Other allergic rhinitis: Secondary | ICD-10-CM | POA: Diagnosis not present

## 2023-10-20 DIAGNOSIS — J301 Allergic rhinitis due to pollen: Secondary | ICD-10-CM | POA: Diagnosis not present

## 2023-10-22 NOTE — Progress Notes (Signed)
 Surgery orders requested via Epic inbox.

## 2023-10-26 DIAGNOSIS — M1712 Unilateral primary osteoarthritis, left knee: Secondary | ICD-10-CM | POA: Diagnosis not present

## 2023-10-27 NOTE — Progress Notes (Addendum)
 COVID Vaccine Completed: yes  Date of COVID positive in last 90 days: no  PCP - Mallissa Seals Cardiologist - Dr. Filiberto Hug LOV 01/05/2020 f/u PRN  Chest x-ray - n/a EKG - 10/28/23 Epic/chart Stress Test - n/a ECHO - 09/20/18 Epic Cardiac Cath - n/a Pacemaker/ICD device last checked: n/a Spinal Cord Stimulator: n/a  Bowel Prep - no  Sleep Study - yes CPAP - yes every night   Fasting Blood Sugar - n/a Checks Blood Sugar _____ times a day  Last dose of GLP1 agonist-  N/A GLP1 instructions:  Hold 7 days before surgery    Last dose of SGLT-2 inhibitors-  N/A SGLT-2 instructions:  Hold 3 days before surgery    Blood Thinner Instructions:  Last dose:  n/a  Time: Aspirin  Instructions: Last Dose:  Activity level: Can go up a flight of stairs and perform activities of daily living without stopping and without symptoms of chest pain or shortness of breath.   Anesthesia review: HTN, OSA, heart murmur   Patient denies shortness of breath, fever, cough and chest pain at PAT appointment  Patient verbalized understanding of instructions that were given to them at the PAT appointment. Patient was also instructed that they will need to review over the PAT instructions again at home before surgery.

## 2023-10-28 ENCOUNTER — Encounter (HOSPITAL_COMMUNITY)
Admission: RE | Admit: 2023-10-28 | Discharge: 2023-10-28 | Disposition: A | Discharge status: Home / Self Care | Source: Ambulatory Visit | Attending: Orthopedic Surgery | Admitting: Orthopedic Surgery

## 2023-10-28 ENCOUNTER — Encounter (HOSPITAL_COMMUNITY): Payer: Self-pay

## 2023-10-28 ENCOUNTER — Other Ambulatory Visit: Payer: Self-pay

## 2023-10-28 VITALS — BP 146/79 | HR 63 | Temp 98.5°F | Resp 12 | Ht 63.0 in | Wt 182.0 lb

## 2023-10-28 DIAGNOSIS — I10 Essential (primary) hypertension: Secondary | ICD-10-CM | POA: Diagnosis not present

## 2023-10-28 DIAGNOSIS — Z01818 Encounter for other preprocedural examination: Secondary | ICD-10-CM | POA: Diagnosis not present

## 2023-10-28 HISTORY — DX: Gastro-esophageal reflux disease without esophagitis: K21.9

## 2023-10-28 HISTORY — DX: Depression, unspecified: F32.A

## 2023-10-28 HISTORY — DX: Anxiety disorder, unspecified: F41.9

## 2023-10-28 LAB — BASIC METABOLIC PANEL WITH GFR
Anion gap: 7 (ref 5–15)
BUN: 12 mg/dL (ref 8–23)
CO2: 24 mmol/L (ref 22–32)
Calcium: 9.2 mg/dL (ref 8.9–10.3)
Chloride: 106 mmol/L (ref 98–111)
Creatinine, Ser: 0.83 mg/dL (ref 0.44–1.00)
GFR, Estimated: >60 mL/min (ref >60)
Glucose, Bld: 95 mg/dL (ref 70–99)
Potassium: 3.7 mmol/L (ref 3.5–5.1)
Sodium: 137 mmol/L (ref 135–145)

## 2023-10-28 LAB — CBC
HCT: 39.3 % (ref 36.0–46.0)
Hemoglobin: 13.4 g/dL (ref 12.0–15.0)
MCH: 30.5 pg (ref 26.0–34.0)
MCHC: 34.1 g/dL (ref 30.0–36.0)
MCV: 89.5 fL (ref 80.0–100.0)
Platelets: 248 10*3/uL (ref 150–400)
RBC: 4.39 MIL/uL (ref 3.87–5.11)
RDW: 12.9 % (ref 11.5–15.5)
WBC: 6.8 10*3/uL (ref 4.0–10.5)
nRBC: 0 % (ref 0.0–0.2)

## 2023-10-28 LAB — SURGICAL PCR SCREEN
MRSA, PCR: NEGATIVE
Staphylococcus aureus: NEGATIVE

## 2023-10-28 NOTE — Progress Notes (Signed)
 Patient stuck 3 times at PAT blood draw. Patient is a very hard stick and veins blow easily.

## 2023-10-28 NOTE — Patient Instructions (Signed)
 SURGICAL WAITING ROOM VISITATION  Patients having surgery or a procedure may have no more than 2 support people in the waiting area - these visitors may rotate.    Children under the age of 33 must have an adult with them who is not the patient.  Due to an increase in RSV and influenza rates and associated hospitalizations, children ages 43 and under may not visit patients in [facility] hospitals.  Visitors with respiratory illnesses are discouraged from visiting and should remain at home.  If the patient needs to stay at the hospital during part of their recovery, the visitor guidelines for inpatient rooms apply. Pre-op nurse will coordinate an appropriate time for 1 support person to accompany patient in pre-op.  This support person may not rotate.    Please refer to the [facility] website for the visitor guidelines for Inpatients (after your surgery is over and you are in a regular room).    Your procedure is scheduled on: 11/10/23   Report to [facility] Main Entrance    Report to admitting at 5:15AM   Call this number if you have problems the morning of surgery [phone]   Do not eat food :After Midnight.   After Midnight you may have the following liquids until 4:30 AM DAY OF SURGERY  Water Non-Citrus Juices (without pulp, NO RED-Apple, White grape, White cranberry) Black Coffee (NO MILK/CREAM OR CREAMERS, sugar ok)  Clear Tea (NO MILK/CREAM OR CREAMERS, sugar ok) regular and decaf                             Plain Jell-O (NO RED)                                           Fruit ices (not with fruit pulp, NO RED)                                     Popsicles (NO RED)                                                               Sports drinks like Gatorade (NO RED)     The day of surgery:  Drink ONE (1) Pre-Surgery Clear Ensure at 4:30 AM the morning of surgery. Drink in one sitting. Do not sip.  This drink was given to you during your hospital  pre-op  appointment visit. Nothing else to drink after completing the  Pre-Surgery Clear Ensure .          If you have questions, please contact your surgeon's office.   FOLLOW BOWEL PREP AND ANY ADDITIONAL PRE OP INSTRUCTIONS YOU RECEIVED FROM YOUR SURGEON'S OFFICE!!!     Oral Hygiene is also important to reduce your risk of infection.                                    Remember - BRUSH YOUR TEETH THE MORNING OF SURGERY WITH YOUR REGULAR TOOTHPASTE  DENTURES WILL BE REMOVED PRIOR TO SURGERY PLEASE DO NOT APPLY "Poly grip" OR ADHESIVES!!!   Do NOT smoke after Midnight   Stop all vitamins and herbal supplements 7 days before surgery.   Take these medicines the morning of surgery with A SIP OF WATER: Inhalers, Zyrtec, Hydralazine, Rosuvastatin                               You may not have any metal on your body including hair pins, jewelry, and body piercing             Do not wear make-up, lotions, powders, perfumes, or deodorant  Do not wear nail polish including gel and S&S, artificial/acrylic nails, or any other type of covering on natural nails including finger and toenails. If you have artificial nails, gel coating, etc. that needs to be removed by a nail salon please have this removed prior to surgery or surgery may need to be canceled/ delayed if the surgeon/ anesthesia feels like they are unable to be safely monitored.   Do not shave  48 hours prior to surgery.    Do not bring valuables to the hospital. [facility] IS NOT             RESPONSIBLE   FOR VALUABLES.   Contacts, glasses, dentures or bridgework may not be worn into surgery.  DO NOT BRING YOUR HOME MEDICATIONS TO THE HOSPITAL. PHARMACY WILL DISPENSE MEDICATIONS LISTED ON YOUR MEDICATION LIST TO YOU DURING YOUR ADMISSION IN THE HOSPITAL!    Patients discharged on the day of surgery will not be allowed to drive home.  Someone NEEDS to stay with you for the first 24 hours after anesthesia.   Special Instructions: Bring a  copy of your healthcare power of attorney and living will documents the day of surgery if you haven't scanned them before.              Please read over the following fact sheets you were given: IF YOU HAVE QUESTIONS ABOUT YOUR PRE-OP INSTRUCTIONS PLEASE CALL [phone] Party    If you received a COVID test during your pre-op visit  it is requested that you wear a mask when out in public, stay away from anyone that may not be feeling well and notify your surgeon if you develop symptoms. If you test positive for Covid or have been in contact with anyone that has tested positive in the last 10 days please notify you surgeon.      Pre-operative 5 CHG Bath Instructions   You can play a key role in reducing the risk of infection after surgery. Your skin needs to be as free of germs as possible. You can reduce the number of germs on your skin by washing with CHG (chlorhexidine gluconate) soap before surgery. CHG is an antiseptic soap that kills germs and continues to kill germs even after washing.   DO NOT use if you have an allergy to chlorhexidine/CHG or antibacterial soaps. If your skin becomes reddened or irritated, stop using the CHG and notify one of our RNs at [phone].   Please shower with the CHG soap starting 4 days before surgery using the following schedule:     Please keep in mind the following:  DO NOT shave, including legs and underarms, starting the day of your first shower.   You may shave your face at any point before/day of surgery.  Place clean sheets  on your bed the day you start using CHG soap. Use a clean washcloth (not used since being washed) for each shower. DO NOT sleep with pets once you start using the CHG.   CHG Shower Instructions:  If you choose to wash your hair and private area, wash first with your normal shampoo/soap.  After you use shampoo/soap, rinse your hair and body thoroughly to remove shampoo/soap residue.  Turn the water OFF and apply about 3  tablespoons (45 ml) of CHG soap to a CLEAN washcloth.  Apply CHG soap ONLY FROM YOUR NECK DOWN TO YOUR TOES (washing for 3-5 minutes)  DO NOT use CHG soap on face, private areas, open wounds, or sores.  Pay special attention to the area where your surgery is being performed.  If you are having back surgery, having someone wash your back for you may be helpful. Wait 2 minutes after CHG soap is applied, then you may rinse off the CHG soap.  Pat dry with a clean towel  Put on clean clothes/pajamas   If you choose to wear lotion, please use ONLY the CHG-compatible lotions on the back of this paper.     Additional instructions for the day of surgery: DO NOT APPLY any lotions, deodorants, cologne, or perfumes.   Put on clean/comfortable clothes.  Brush your teeth.  Ask your nurse before applying any prescription medications to the skin.      CHG Compatible Lotions   Aveeno Moisturizing lotion  Cetaphil Moisturizing Cream  Cetaphil Moisturizing Lotion  Clairol Herbal Essence Moisturizing Lotion, Dry Skin  Clairol Herbal Essence Moisturizing Lotion, Extra Dry Skin  Clairol Herbal Essence Moisturizing Lotion, Normal Skin  Curel Age Defying Therapeutic Moisturizing Lotion with Alpha Hydroxy  Curel Extreme Care Body Lotion  Curel Soothing Hands Moisturizing Hand Lotion  Curel Therapeutic Moisturizing Cream, Fragrance-Free  Curel Therapeutic Moisturizing Lotion, Fragrance-Free  Curel Therapeutic Moisturizing Lotion, Original Formula  Eucerin Daily Replenishing Lotion  Eucerin Dry Skin Therapy Plus Alpha Hydroxy Crme  Eucerin Dry Skin Therapy Plus Alpha Hydroxy Lotion  Eucerin Original Crme  Eucerin Original Lotion  Eucerin Plus Crme Eucerin Plus Lotion  Eucerin TriLipid Replenishing Lotion  Keri Anti-Bacterial Hand Lotion  Keri Deep Conditioning Original Lotion Dry Skin Formula Softly Scented  Keri Deep Conditioning Original Lotion, Fragrance Free Sensitive Skin Formula  Keri  Lotion Fast Absorbing Fragrance Free Sensitive Skin Formula  Keri Lotion Fast Absorbing Softly Scented Dry Skin Formula  Keri Original Lotion  Keri Skin Renewal Lotion Keri Silky Smooth Lotion  Keri Silky Smooth Sensitive Skin Lotion  Nivea Body Creamy Conditioning Oil  Nivea Body Extra Enriched Lotion  Nivea Body Original Lotion  Nivea Body Sheer Moisturizing Lotion Nivea Crme  Nivea Skin Firming Lotion  NutraDerm 30 Skin Lotion  NutraDerm Skin Lotion  NutraDerm Therapeutic Skin Cream  NutraDerm Therapeutic Skin Lotion  ProShield Protective Hand Cream  Provon moisturizing lotion   Incentive Spirometer  An incentive spirometer is a tool that can help keep your lungs clear and active. This tool measures how well you are filling your lungs with each breath. Taking long deep breaths may help reverse or decrease the chance of developing breathing (pulmonary) problems (especially infection) following: A long period of time when you are unable to move or be active. BEFORE THE PROCEDURE  If the spirometer includes an indicator to show your best effort, your nurse or respiratory therapist will set it to a desired goal. If possible, sit up straight or lean slightly forward. Try  not to slouch. Hold the incentive spirometer in an upright position. INSTRUCTIONS FOR USE  Sit on the edge of your bed if possible, or sit up as far as you can in bed or on a chair. Hold the incentive spirometer in an upright position. Breathe out normally. Place the mouthpiece in your mouth and seal your lips tightly around it. Breathe in slowly and as deeply as possible, raising the piston or the ball toward the top of the column. Hold your breath for 3-5 seconds or for as long as possible. Allow the piston or ball to fall to the bottom of the column. Remove the mouthpiece from your mouth and breathe out normally. Rest for a few seconds and repeat Steps 1 through 7 at least 10 times every 1-2 hours when you are  awake. Take your time and take a few normal breaths between deep breaths. The spirometer may include an indicator to show your best effort. Use the indicator as a goal to work toward during each repetition. After each set of 10 deep breaths, practice coughing to be sure your lungs are clear. If you have an incision (the cut made at the time of surgery), support your incision when coughing by placing a pillow or rolled up towels firmly against it. Once you are able to get out of bed, walk around indoors and cough well. You may stop using the incentive spirometer when instructed by your caregiver.  RISKS AND COMPLICATIONS Take your time so you do not get dizzy or light-headed. If you are in pain, you may need to take or ask for pain medication before doing incentive spirometry. It is harder to take a deep breath if you are having pain. AFTER USE Rest and breathe slowly and easily. It can be helpful to keep track of a log of your progress. Your caregiver can provide you with a simple table to help with this. If you are using the spirometer at home, follow these instructions: SEEK MEDICAL CARE IF:  You are having difficultly using the spirometer. You have trouble using the spirometer as often as instructed. Your pain medication is not giving enough relief while using the spirometer. You develop fever of 100.5 F (38.1 C) or higher. SEEK IMMEDIATE MEDICAL CARE IF:  You cough up bloody sputum that had not been present before. You develop fever of 102 F (38.9 C) or greater. You develop worsening pain at or near the incision site. MAKE SURE YOU:  Understand these instructions. Will watch your condition. Will get help right away if you are not doing well or get worse. Document Released: 11/10/2006 Document Revised: 09/22/2011 Document Reviewed: 01/11/2007 [facility] Patient Information 2014 [place], [place].   ________________________________________________________________________

## 2023-11-02 DIAGNOSIS — N3941 Urge incontinence: Secondary | ICD-10-CM | POA: Diagnosis not present

## 2023-11-02 DIAGNOSIS — J301 Allergic rhinitis due to pollen: Secondary | ICD-10-CM | POA: Diagnosis not present

## 2023-11-02 DIAGNOSIS — J3089 Other allergic rhinitis: Secondary | ICD-10-CM | POA: Diagnosis not present

## 2023-11-02 DIAGNOSIS — J3081 Allergic rhinitis due to animal (cat) (dog) hair and dander: Secondary | ICD-10-CM | POA: Diagnosis not present

## 2023-11-03 NOTE — Care Plan (Signed)
 Ortho Bundle Case Management Note  Patient Details  Name: Becky Davis MRN: [id number] Date of Birth: 1951/03/05   met with patient in the office for H&P. will discharge to home with friends to help. has RW at home. [facility] will see her at home for 4-5 visits prior to starting in the clinic. discharge instructions discussed and questions answered. Patient and MD in agreement. Choice offered.                   DME Arranged:    DME Agency:     HH Arranged:    HH Agency:     Additional Comments: Please contact me with any questions of if this plan should need to change.  Cornelia Dieter,  RN,BSN,MHA,CCM  [facility] Orthopaedic Specialist  [phone] 11/03/2023, 4:52 PM

## 2023-11-09 NOTE — H&P (Signed)
 KNEE ARTHROPLASTY ADMISSION H&P  Patient ID: Becky Davis MRN: [id number] DOB/AGE: [birth year] 73 y.o.  Chief Complaint: left knee pain.  Planned Procedure Date: 11/10/23 Medical Clearance by Dr. Donalynn Fry   HPI: Becky Davis is a 73 y.o. female who presents for evaluation of osteoarthritis knee, left. The patient has a history of pain and functional disability in the left knee due to arthritis and has failed non-surgical conservative treatments for greater than 12 weeks to include NSAID's and/or analgesics, corticosteriod injections, viscosupplementation injections, and activity modification.  Onset of symptoms was gradual, starting 2 years ago with rapidlly worsening course since that time. The patient noted no past surgery on the left knee.  Patient currently rates pain at 6 out of 10 with activity. Patient has worsening of pain with activity and weight bearing and pain that interferes with activities of daily living.  Patient has evidence of joint space narrowing by imaging studies.  There is no active infection.  Past Medical History:  Diagnosis Date   Anxiety    Arthritis    knees   Asthma    Depression    GERD (gastroesophageal reflux disease)    Headache    hx of 20 years ago   Heart murmur    hx of 10-15 years ago   Hypertension    Hypothyroidism    Pneumonia    Pre-diabetes    Thyroid  nodule    Past Surgical History:  Procedure Laterality Date   ABDOMINAL HYSTERECTOMY     thyroid  goiter     surgery   THYROID  LOBECTOMY     06-25-17 Dr. Sofia Dunn   Left   THYROID  LOBECTOMY Left 06/25/2017   Procedure: LEFT THYROID  LOBECTOMY;  Surgeon: Oralee Billow, MD;  Location: [facility];  Service: General;  Laterality: Left;   TOTAL KNEE ARTHROPLASTY Right 12/20/2019   Procedure: TOTAL KNEE ARTHROPLASTY;  Surgeon: Osa Blase, MD;  Location: [facility] OR[facility];  Service: Orthopedics;  Laterality: Right;   Allergies  Allergen Reactions   Amlodipine Itching   Dyazide  [Hydrochlorothiazide -Triamterene]     Fainting    Lisinopril Other (See Comments)    Sneezing    Prednisone      Had depression in 1991 after extended use of predisone. Pt has taken this medication since in a short time period, and has had no reaction.    Juniper Oil Rash   Penicillins Rash    Patient received ancef  on 12/20/19 with no adverse reaction.    Prior to Admission medications   Medication Sig Start Date End Date Taking? Authorizing Provider  albuterol  (PROAIR  HFA) 108 (90 Base) MCG/ACT inhaler Inhale 2 puffs into the lungs as needed (Shortness of breath). 03/30/19  Yes Antonio Baumgarten, NP  amantadine (SYMMETREL) 100 MG capsule Take 100 mg by mouth 2 (two) times daily.   Yes [provider]  calcitRIOL  (ROCALTROL ) 0.25 MCG capsule Take 0.25 mcg by mouth 2 (two) times daily. 12/01/19  Yes [provider]  Calcium  Carbonate-Vitamin D  (CALTRATE 600+D PO) Take 600 mg by mouth daily.   Yes [provider]  cetirizine (ZYRTEC) 10 MG tablet Take 10 mg by mouth daily. 10/12/19  Yes [provider]  donepezil  (ARICEPT ) 5 MG tablet Take 1 tablet (5 mg total) by mouth at bedtime. 08/06/21  Yes Juliana Ocean, DO  fluticasone  furoate-vilanterol (BREO ELLIPTA ) 100-25 MCG/ACT AEPB Inhale 1 puff into the lungs daily.   Yes [provider]  hydrALAZINE  (APRESOLINE ) 25 MG tablet Take 25 mg by  mouth 3 (three) times daily.   Yes [provider]  levothyroxine  (SYNTHROID ) 112 MCG tablet Take 112 mcg by mouth at bedtime.   Yes [provider]  losartan  (COZAAR ) 100 MG tablet Take 0.5 tablets (50 mg total) by mouth daily. Patient taking differently: Take 100 mg by mouth daily. 12/21/19  Yes Mattea Seger K, PA-C  lurasidone (LATUDA) 40 MG TABS tablet Take 40 mg by mouth at bedtime.   Yes [provider]  mirtazapine  (REMERON ) 15 MG tablet Take 1 tablet (15 mg total) by mouth at bedtime. 08/06/21  Yes Juliana Ocean, DO   montelukast  (SINGULAIR ) 10 MG tablet Take 10 mg by mouth at bedtime.   Yes [provider]  rosuvastatin (CRESTOR) 10 MG tablet Take 1 tablet by mouth daily.   Yes [provider]   Social History   Socioeconomic History   Marital status: Married    Spouse name: Not on file   Number of children: 3   Years of education: Not on file   Highest education level: Not on file  Occupational History   Not on file  Tobacco Use   Smoking status: Never   Smokeless tobacco: Never  Vaping Use   Vaping status: Never Used  Substance and Sexual Activity   Alcohol use: No   Drug use: No   Sexual activity: Not Currently  Other Topics Concern   Not on file  Social History Narrative   Not on file   Social Drivers of Health   Financial Resource Strain: Low Risk  (10/30/2023)   Received from [facility]   Overall Financial Resource Strain (CARDIA)    Difficulty of Paying Living Expenses: Not hard at all  Food Insecurity: No Food Insecurity (10/30/2023)   Received from [facility]   Hunger Vital Sign    Worried About Running Out of Food in the Last Year: Never true    Ran Out of Food in the Last Year: Never true  Transportation Needs: No Transportation Needs (10/30/2023)   Received [facility] - Transportation    Lack of Transportation (Medical): No    Lack of Transportation (Non-Medical): No  Physical Activity: Insufficiently Active (10/30/2023)   Received from [facility]   Exercise Vital Sign    Days of Exercise per Week: 2 days    Minutes of Exercise per Session: 40 min  Stress: No Stress Concern Present (10/30/2023)   Received from [facility] of Occupational [facility] - Occupational Stress Questionnaire    Feeling of Stress : Not at all  Social Connections: Socially Integrated (10/30/2023)   Received from [facility]   Social Network    How would you rate your social network (family, work, friends)?: Good participation  with social networks   Family History  Problem Relation Age of Onset   Emphysema Mother    Cancer Mother    Heart disease Mother    Cancer Sister    Cancer Brother     ROS: Currently denies lightheadedness, dizziness, Fever, chills, CP, SOB.   No personal history of DVT, PE, MI, or CVA. No loose teeth or dentures All other systems have been reviewed and were otherwise currently negative with the exception of those mentioned in the HPI and as above.  Objective: Vitals: Ht: 5' 2.5" Wt: 186.7 lbs Temp: 97.8 BP: 126/80 Pulse: 77 O2 95% on room air.   Physical Exam: General: Alert, NAD.  Antalgic Gait  HEENT: EOMI,  Good Neck Extension  Pulm: No increased work of breathing.  Clear B/L A/P w/o crackle or wheeze.  CV: RRR, No m/g/r appreciated  GI: soft, NT, ND Neuro: Neuro without gross focal deficit.  Sensation intact distally Skin: No lesions in the area of chief complaint MSK/Surgical Site: left knee w/o redness or effusion.  no JLT. ROM 5-120.  5/5 strength in extension and flexion.  +EHL/FHL.  NVI.  Stable varus and valgus stress.    Imaging Review Plain radiographs demonstrate severe degenerative joint disease of the left knee.   Preoperative templating of the joint replacement has been completed, documented, and submitted to the Operating Room personnel in order to optimize intra-operative equipment management.  Assessment: osteoarthritis knee, left   Plan: Plan for Procedure(s): ARTHROPLASTY, KNEE, TOTAL  The patient history, physical exam, clinical judgement of the provider and imaging are consistent with end stage degenerative joint disease and total joint arthroplasty is deemed medically necessary. The treatment options including medical management, injection therapy, and arthroplasty were discussed at length. The risks and benefits of Procedure(s): ARTHROPLASTY, KNEE, TOTAL were presented and reviewed.  The risks of nonoperative treatment, versus surgical intervention  including but not limited to continued pain, aseptic loosening, stiffness, dislocation/subluxation, infection, bleeding, nerve injury, blood clots, cardiopulmonary complications, morbidity, mortality, among others were discussed. The patient verbalizes understanding and wishes to proceed with the plan.  Patient is being admitted for inpatient treatment for surgery, pain control, PT, prophylactic antibiotics, VTE prophylaxis, progressive ambulation, ADL's and discharge planning.   The patient does meet the criteria for TXA which will be used perioperatively.   ASA 325 mg will be used postoperatively for DVT prophylaxis in addition to SCDs, and early ambulation.   Geneive Sandstrom K Horice Carrero, PA-C 11/09/2023 10:12 AM

## 2023-11-09 NOTE — Discharge Instructions (Signed)

## 2023-11-10 ENCOUNTER — Ambulatory Visit (HOSPITAL_COMMUNITY): Admitting: Certified Registered"

## 2023-11-10 ENCOUNTER — Observation Stay (HOSPITAL_COMMUNITY)

## 2023-11-10 ENCOUNTER — Encounter (HOSPITAL_COMMUNITY): Payer: Self-pay | Admitting: Orthopedic Surgery

## 2023-11-10 ENCOUNTER — Ambulatory Visit (HOSPITAL_COMMUNITY): Payer: Self-pay | Admitting: Physician Assistant

## 2023-11-10 ENCOUNTER — Encounter (HOSPITAL_COMMUNITY)
Admission: RE | Disposition: A | Discharge status: Home / Self Care | Payer: Self-pay | Source: Ambulatory Visit | Attending: Orthopedic Surgery

## 2023-11-10 ENCOUNTER — Other Ambulatory Visit: Payer: Self-pay

## 2023-11-10 ENCOUNTER — Observation Stay (HOSPITAL_COMMUNITY)
Admission: RE | Admit: 2023-11-10 | Discharge: 2023-11-11 | Disposition: A | Discharge status: Home / Self Care | Payer: Medicare Other | Source: Ambulatory Visit | Attending: Orthopedic Surgery | Admitting: Orthopedic Surgery

## 2023-11-10 DIAGNOSIS — M1712 Unilateral primary osteoarthritis, left knee: Secondary | ICD-10-CM | POA: Diagnosis not present

## 2023-11-10 DIAGNOSIS — J45909 Unspecified asthma, uncomplicated: Secondary | ICD-10-CM | POA: Diagnosis not present

## 2023-11-10 DIAGNOSIS — Z96652 Presence of left artificial knee joint: Secondary | ICD-10-CM | POA: Diagnosis not present

## 2023-11-10 DIAGNOSIS — Z79899 Other long term (current) drug therapy: Secondary | ICD-10-CM | POA: Diagnosis not present

## 2023-11-10 DIAGNOSIS — F418 Other specified anxiety disorders: Secondary | ICD-10-CM | POA: Diagnosis not present

## 2023-11-10 DIAGNOSIS — E039 Hypothyroidism, unspecified: Secondary | ICD-10-CM | POA: Insufficient documentation

## 2023-11-10 DIAGNOSIS — R609 Edema, unspecified: Secondary | ICD-10-CM | POA: Diagnosis not present

## 2023-11-10 DIAGNOSIS — I10 Essential (primary) hypertension: Secondary | ICD-10-CM | POA: Diagnosis not present

## 2023-11-10 DIAGNOSIS — G8918 Other acute postprocedural pain: Secondary | ICD-10-CM | POA: Diagnosis not present

## 2023-11-10 DIAGNOSIS — Z471 Aftercare following joint replacement surgery: Secondary | ICD-10-CM | POA: Diagnosis not present

## 2023-11-10 DIAGNOSIS — Z96651 Presence of right artificial knee joint: Secondary | ICD-10-CM | POA: Diagnosis not present

## 2023-11-10 HISTORY — PX: TOTAL KNEE ARTHROPLASTY: SHX125

## 2023-11-10 SURGERY — ARTHROPLASTY, KNEE, TOTAL
Anesthesia: Regional | Site: Knee | Laterality: Left

## 2023-11-10 MED ORDER — MORPHINE SULFATE (PF) 2 MG/ML IV SOLN
0.5000 mg | INTRAVENOUS | Status: DC | PRN
  Administered 2023-11-10: 0.5 mg via INTRAVENOUS
  Filled 2023-11-10: qty 1

## 2023-11-10 MED ORDER — MENTHOL 3 MG MT LOZG: 1.0000 | LOZENGE | OROMUCOSAL | Status: DC | PRN

## 2023-11-10 MED ORDER — KETOROLAC TROMETHAMINE 30 MG/ML IJ SOLN
INTRAMUSCULAR | Status: DC | PRN
  Administered 2023-11-10: 30 mg via INTRA_ARTICULAR

## 2023-11-10 MED ORDER — DONEPEZIL HCL 10 MG PO TABS
5.0000 mg | ORAL_TABLET | Freq: Every day | ORAL | Status: DC
  Administered 2023-11-10: 5 mg via ORAL
  Filled 2023-11-10: qty 1

## 2023-11-10 MED ORDER — ALBUTEROL SULFATE (2.5 MG/3ML) 0.083% IN NEBU: 2.5000 mg | INHALATION_SOLUTION | Freq: Four times a day (QID) | RESPIRATORY_TRACT | Status: DC | PRN

## 2023-11-10 MED ORDER — FENTANYL CITRATE PF 50 MCG/ML IJ SOSY: 25.0000 ug | PREFILLED_SYRINGE | INTRAMUSCULAR | Status: DC | PRN

## 2023-11-10 MED ORDER — FENTANYL CITRATE PF 50 MCG/ML IJ SOSY
50.0000 ug | PREFILLED_SYRINGE | INTRAMUSCULAR | Status: DC
  Administered 2023-11-10: 50 ug via INTRAVENOUS
  Filled 2023-11-10: qty 2

## 2023-11-10 MED ORDER — FLUTICASONE FUROATE-VILANTEROL 100-25 MCG/ACT IN AEPB
1.0000 | INHALATION_SPRAY | Freq: Every day | RESPIRATORY_TRACT | Status: DC
  Filled 2023-11-10: qty 28

## 2023-11-10 MED ORDER — WATER FOR IRRIGATION, STERILE IR SOLN
Status: DC | PRN
  Administered 2023-11-10: 1000 mL

## 2023-11-10 MED ORDER — ACETAMINOPHEN 325 MG PO TABS: 325.0000 mg | ORAL_TABLET | Freq: Four times a day (QID) | ORAL | Status: DC | PRN

## 2023-11-10 MED ORDER — METOCLOPRAMIDE HCL 5 MG/ML IJ SOLN: 5.0000 mg | Freq: Three times a day (TID) | INTRAMUSCULAR | Status: DC | PRN

## 2023-11-10 MED ORDER — ALUM & MAG HYDROXIDE-SIMETH 200-200-20 MG/5ML PO SUSP: 30.0000 mL | ORAL | Status: DC | PRN

## 2023-11-10 MED ORDER — ROSUVASTATIN CALCIUM 10 MG PO TABS
10.0000 mg | ORAL_TABLET | Freq: Every day | ORAL | Status: DC
  Administered 2023-11-11: 10 mg via ORAL
  Filled 2023-11-10: qty 1

## 2023-11-10 MED ORDER — BUPIVACAINE HCL 0.25 % IJ SOLN
INTRAMUSCULAR | Status: DC | PRN
  Administered 2023-11-10: 30 mL

## 2023-11-10 MED ORDER — MIRTAZAPINE 15 MG PO TABS
15.0000 mg | ORAL_TABLET | Freq: Every day | ORAL | Status: DC
  Administered 2023-11-10: 15 mg via ORAL
  Filled 2023-11-10: qty 1

## 2023-11-10 MED ORDER — BUPIVACAINE-EPINEPHRINE (PF) 0.5% -1:200000 IJ SOLN
INTRAMUSCULAR | Status: DC | PRN
  Administered 2023-11-10: 30 mL via PERINEURAL

## 2023-11-10 MED ORDER — PROPOFOL 500 MG/50ML IV EMUL
INTRAVENOUS | Status: DC | PRN
  Administered 2023-11-10: 30 ug/kg/min via INTRAVENOUS

## 2023-11-10 MED ORDER — LOSARTAN POTASSIUM 50 MG PO TABS
100.0000 mg | ORAL_TABLET | Freq: Every day | ORAL | Status: DC
  Administered 2023-11-11: 100 mg via ORAL
  Filled 2023-11-10: qty 2

## 2023-11-10 MED ORDER — ONDANSETRON HCL 4 MG/2ML IJ SOLN: 4.0000 mg | Freq: Four times a day (QID) | INTRAMUSCULAR | Status: DC | PRN

## 2023-11-10 MED ORDER — PHENYLEPHRINE HCL-NACL 20-0.9 MG/250ML-% IV SOLN
INTRAVENOUS | Status: DC | PRN
  Administered 2023-11-10: 20 ug/min via INTRAVENOUS

## 2023-11-10 MED ORDER — ACETAMINOPHEN 500 MG PO TABS
500.0000 mg | ORAL_TABLET | Freq: Four times a day (QID) | ORAL | Status: DC
Start: 2023-11-10 — End: 2023-11-11
  Administered 2023-11-10: 500 mg via ORAL
  Filled 2023-11-10 (×2): qty 1

## 2023-11-10 MED ORDER — METHOCARBAMOL 500 MG PO TABS: 500.0000 mg | ORAL_TABLET | Freq: Four times a day (QID) | ORAL | Status: DC | PRN

## 2023-11-10 MED ORDER — METHOCARBAMOL 1000 MG/10ML IJ SOLN: 500.0000 mg | Freq: Four times a day (QID) | INTRAMUSCULAR | Status: DC | PRN

## 2023-11-10 MED ORDER — METOCLOPRAMIDE HCL 5 MG PO TABS: 5.0000 mg | ORAL_TABLET | Freq: Three times a day (TID) | ORAL | Status: DC | PRN

## 2023-11-10 MED ORDER — BUPIVACAINE HCL (PF) 0.25 % IJ SOLN
INTRAMUSCULAR | Status: AC
  Filled 2023-11-10: qty 30

## 2023-11-10 MED ORDER — LACTATED RINGERS IV SOLN: INTRAVENOUS | Status: DC

## 2023-11-10 MED ORDER — LEVOTHYROXINE SODIUM 112 MCG PO TABS
112.0000 ug | ORAL_TABLET | Freq: Every day | ORAL | Status: DC
  Administered 2023-11-11: 112 ug via ORAL
  Filled 2023-11-10: qty 1

## 2023-11-10 MED ORDER — POVIDONE-IODINE 7.5 % EX SOLN
Freq: Once | CUTANEOUS | Status: DC
Start: 2023-11-10 — End: 2023-11-10

## 2023-11-10 MED ORDER — MIDAZOLAM HCL 2 MG/2ML IJ SOLN
INTRAMUSCULAR | Status: AC
  Filled 2023-11-10: qty 2

## 2023-11-10 MED ORDER — GLYCOPYRROLATE 0.2 MG/ML IJ SOLN
INTRAMUSCULAR | Status: DC | PRN
  Administered 2023-11-10: .2 mg via INTRAVENOUS

## 2023-11-10 MED ORDER — TRANEXAMIC ACID-NACL 1000-0.7 MG/100ML-% IV SOLN
1000.0000 mg | Freq: Once | INTRAVENOUS | Status: AC
  Administered 2023-11-10: 1000 mg via INTRAVENOUS
  Filled 2023-11-10: qty 100

## 2023-11-10 MED ORDER — MONTELUKAST SODIUM 10 MG PO TABS: 10.0000 mg | ORAL_TABLET | Freq: Every day | ORAL | Status: DC

## 2023-11-10 MED ORDER — LURASIDONE HCL 40 MG PO TABS
40.0000 mg | ORAL_TABLET | Freq: Every day | ORAL | Status: DC
  Administered 2023-11-10: 40 mg via ORAL
  Filled 2023-11-10 (×2): qty 1

## 2023-11-10 MED ORDER — DOCUSATE SODIUM 100 MG PO CAPS
100.0000 mg | ORAL_CAPSULE | Freq: Two times a day (BID) | ORAL | Status: DC
  Administered 2023-11-10 – 2023-11-11 (×2): 100 mg via ORAL
  Filled 2023-11-10 (×2): qty 1

## 2023-11-10 MED ORDER — DIPHENHYDRAMINE HCL 12.5 MG/5ML PO ELIX: 12.5000 mg | ORAL_SOLUTION | ORAL | Status: DC | PRN

## 2023-11-10 MED ORDER — ASPIRIN 325 MG PO TBEC
325.0000 mg | DELAYED_RELEASE_TABLET | Freq: Two times a day (BID) | ORAL | Status: DC
  Administered 2023-11-11: 325 mg via ORAL
  Filled 2023-11-10: qty 1

## 2023-11-10 MED ORDER — CALCITRIOL 0.25 MCG PO CAPS
0.2500 ug | ORAL_CAPSULE | Freq: Two times a day (BID) | ORAL | Status: DC
  Administered 2023-11-11: 0.25 ug via ORAL
  Filled 2023-11-10 (×2): qty 1

## 2023-11-10 MED ORDER — PHENOL 1.4 % MT LIQD: 1.0000 | OROMUCOSAL | Status: DC | PRN

## 2023-11-10 MED ORDER — BISACODYL 10 MG RE SUPP: 10.0000 mg | Freq: Every day | RECTAL | Status: DC | PRN

## 2023-11-10 MED ORDER — HYDROCODONE-ACETAMINOPHEN 7.5-325 MG PO TABS: 1.0000 | ORAL_TABLET | ORAL | Status: DC | PRN

## 2023-11-10 MED ORDER — ONDANSETRON HCL 4 MG/2ML IJ SOLN: 4.0000 mg | Freq: Once | INTRAMUSCULAR | Status: DC | PRN

## 2023-11-10 MED ORDER — TRANEXAMIC ACID-NACL 1000-0.7 MG/100ML-% IV SOLN
1000.0000 mg | INTRAVENOUS | Status: AC
  Administered 2023-11-10: 1000 mg via INTRAVENOUS
  Filled 2023-11-10: qty 100

## 2023-11-10 MED ORDER — MAGNESIUM CITRATE PO SOLN: 1.0000 | Freq: Once | ORAL | Status: DC | PRN

## 2023-11-10 MED ORDER — AMISULPRIDE (ANTIEMETIC) 5 MG/2ML IV SOLN: 10.0000 mg | Freq: Once | INTRAVENOUS | Status: DC | PRN

## 2023-11-10 MED ORDER — POLYETHYLENE GLYCOL 3350 17 G PO PACK
17.0000 g | PACK | Freq: Every day | ORAL | Status: DC | PRN
  Administered 2023-11-11: 17 g via ORAL

## 2023-11-10 MED ORDER — BUPIVACAINE IN DEXTROSE 0.75-8.25 % IT SOLN
INTRATHECAL | Status: DC | PRN
Start: 2023-11-10 — End: 2023-11-10
  Administered 2023-11-10: 1.8 mL via INTRATHECAL

## 2023-11-10 MED ORDER — PROPOFOL 10 MG/ML IV BOLUS
INTRAVENOUS | Status: DC | PRN
Start: 2023-11-10 — End: 2023-11-10
  Administered 2023-11-10: 10 mg via INTRAVENOUS
  Administered 2023-11-10: 20 mg via INTRAVENOUS
  Administered 2023-11-10: 10 mg via INTRAVENOUS
  Administered 2023-11-10: 30 mg via INTRAVENOUS
  Administered 2023-11-10 (×2): 20 mg via INTRAVENOUS
  Administered 2023-11-10: 10 mg via INTRAVENOUS
  Administered 2023-11-10: 20 mg via INTRAVENOUS

## 2023-11-10 MED ORDER — GLYCOPYRROLATE PF 0.2 MG/ML IJ SOSY: PREFILLED_SYRINGE | INTRAMUSCULAR | Status: DC | PRN

## 2023-11-10 MED ORDER — ORAL CARE MOUTH RINSE: 15.0000 mL | Freq: Once | OROMUCOSAL | Status: AC

## 2023-11-10 MED ORDER — CEFAZOLIN SODIUM-DEXTROSE 2-4 GM/100ML-% IV SOLN
2.0000 g | INTRAVENOUS | Status: AC
  Administered 2023-11-10: 2 g via INTRAVENOUS
  Filled 2023-11-10: qty 100

## 2023-11-10 MED ORDER — CHLORHEXIDINE GLUCONATE 0.12 % MT SOLN
15.0000 mL | Freq: Once | OROMUCOSAL | Status: AC
  Administered 2023-11-10: 15 mL via OROMUCOSAL

## 2023-11-10 MED ORDER — MIDAZOLAM HCL 2 MG/2ML IJ SOLN: 1.0000 mg | Freq: Once | INTRAMUSCULAR | Status: DC

## 2023-11-10 MED ORDER — POVIDONE-IODINE 10 % EX SWAB: 2.0000 | Freq: Once | CUTANEOUS | Status: DC

## 2023-11-10 MED ORDER — HYDRALAZINE HCL 25 MG PO TABS
25.0000 mg | ORAL_TABLET | Freq: Three times a day (TID) | ORAL | Status: DC
  Administered 2023-11-10 – 2023-11-11 (×3): 25 mg via ORAL
  Filled 2023-11-10 (×3): qty 1

## 2023-11-10 MED ORDER — TRANEXAMIC ACID-NACL 1000-0.7 MG/100ML-% IV SOLN
INTRAVENOUS | Status: AC
  Filled 2023-11-10: qty 100

## 2023-11-10 MED ORDER — POTASSIUM CHLORIDE IN NACL 20-0.9 MEQ/L-% IV SOLN
INTRAVENOUS | Status: AC
  Filled 2023-11-10 (×2): qty 1000

## 2023-11-10 MED ORDER — ACETAMINOPHEN 500 MG PO TABS
1000.0000 mg | ORAL_TABLET | Freq: Once | ORAL | Status: AC
  Administered 2023-11-10: 1000 mg via ORAL
  Filled 2023-11-10: qty 2

## 2023-11-10 MED ORDER — DEXMEDETOMIDINE HCL IN NACL 200 MCG/50ML IV SOLN
INTRAVENOUS | Status: DC | PRN
Start: 2023-11-10 — End: 2023-11-10
  Administered 2023-11-10: 8 ug via INTRAVENOUS

## 2023-11-10 MED ORDER — SODIUM CHLORIDE 0.9 % IR SOLN
Status: DC | PRN
  Administered 2023-11-10: 1000 mL

## 2023-11-10 MED ORDER — ALBUTEROL SULFATE HFA 108 (90 BASE) MCG/ACT IN AERS: 2.0000 | INHALATION_SPRAY | RESPIRATORY_TRACT | Status: DC | PRN

## 2023-11-10 MED ORDER — ONDANSETRON HCL 4 MG PO TABS: 4.0000 mg | ORAL_TABLET | Freq: Four times a day (QID) | ORAL | Status: DC | PRN

## 2023-11-10 MED ORDER — AMANTADINE HCL 100 MG PO CAPS
100.0000 mg | ORAL_CAPSULE | Freq: Two times a day (BID) | ORAL | Status: DC
  Administered 2023-11-11: 100 mg via ORAL
  Filled 2023-11-10 (×2): qty 1

## 2023-11-10 MED ORDER — KETOROLAC TROMETHAMINE 30 MG/ML IJ SOLN
INTRAMUSCULAR | Status: AC
  Filled 2023-11-10: qty 1

## 2023-11-10 MED ORDER — HYDROCODONE-ACETAMINOPHEN 5-325 MG PO TABS
1.0000 | ORAL_TABLET | ORAL | Status: DC | PRN
  Administered 2023-11-10: 1 via ORAL
  Administered 2023-11-11: 2 via ORAL
  Administered 2023-11-11: 1 via ORAL
  Administered 2023-11-11: 2 via ORAL
  Filled 2023-11-10 (×2): qty 2
  Filled 2023-11-10 (×2): qty 1

## 2023-11-10 MED ORDER — DEXAMETHASONE SODIUM PHOSPHATE 10 MG/ML IJ SOLN
INTRAMUSCULAR | Status: DC | PRN
  Administered 2023-11-10: 8 mg via INTRAVENOUS

## 2023-11-10 MED ORDER — ONDANSETRON HCL 4 MG/2ML IJ SOLN
INTRAMUSCULAR | Status: DC | PRN
  Administered 2023-11-10: 4 mg via INTRAVENOUS

## 2023-11-10 MED ORDER — CEFAZOLIN SODIUM-DEXTROSE 2-4 GM/100ML-% IV SOLN
2.0000 g | Freq: Four times a day (QID) | INTRAVENOUS | Status: AC
  Administered 2023-11-10 (×2): 2 g via INTRAVENOUS
  Filled 2023-11-10 (×2): qty 100

## 2023-11-10 SURGICAL SUPPLY — 50 items
ATTUNE MED DOME PAT 32 KNEE (Knees) IMPLANT
ATTUNE PSFEM LTSZ4 NARCEM KNEE (Femur) IMPLANT
BAG COUNTER SPONGE SURGICOUNT (BAG) IMPLANT
BAG ZIPLOCK 12X15 (MISCELLANEOUS) IMPLANT
BASEPLATE TIB CMT FB PCKT SZ3 (Knees) IMPLANT
BLADE SAG 18X100X1.27 (BLADE) ×1 IMPLANT
BLADE SAW SGTL 11.0X1.19X90.0M (BLADE) IMPLANT
BLADE SAW SGTL 13X75X1.27 (BLADE) ×1 IMPLANT
BLADE SURG 15 STRL LF DISP TIS (BLADE) ×1 IMPLANT
BNDG ELASTIC 6X10 VLCR STRL LF (GAUZE/BANDAGES/DRESSINGS) ×1 IMPLANT
BOWL SMART MIX CTS (DISPOSABLE) ×1 IMPLANT
CEMENT HV SMART SET (Cement) ×2 IMPLANT
CLSR STERI-STRIP ANTIMIC 1/2X4 (GAUZE/BANDAGES/DRESSINGS) ×2 IMPLANT
COVER SURGICAL LIGHT HANDLE (MISCELLANEOUS) ×1 IMPLANT
CUFF TRNQT CYL 34X4.125X (TOURNIQUET CUFF) ×1 IMPLANT
DERMABOND ADVANCED .7 DNX12 (GAUZE/BANDAGES/DRESSINGS) IMPLANT
DRAPE SHEET LG 3/4 BI-LAMINATE (DRAPES) ×1 IMPLANT
DRAPE U-SHAPE 47X51 STRL (DRAPES) ×1 IMPLANT
DRSG MEPILEX POST OP 4X12 (GAUZE/BANDAGES/DRESSINGS) ×1 IMPLANT
DURAPREP 26ML APPLICATOR (WOUND CARE) ×2 IMPLANT
ELECT PENCIL ROCKER SW 15FT (MISCELLANEOUS) ×1 IMPLANT
ELECT REM PT RETURN 15FT ADLT (MISCELLANEOUS) ×1 IMPLANT
GAUZE PAD ABD 8X10 STRL (GAUZE/BANDAGES/DRESSINGS) ×2 IMPLANT
GLOVE BIO SURGEON STRL SZ 6.5 (GLOVE) ×1 IMPLANT
GLOVE BIO SURGEON STRL SZ7.5 (GLOVE) ×1 IMPLANT
GLOVE BIOGEL PI IND STRL 7.0 (GLOVE) ×1 IMPLANT
GLOVE BIOGEL PI IND STRL 8 (GLOVE) ×1 IMPLANT
GOWN STRL SURGICAL XL XLNG (GOWN DISPOSABLE) ×2 IMPLANT
HOLDER FOLEY CATH W/STRAP (MISCELLANEOUS) IMPLANT
IMMOBILIZER KNEE 20 (SOFTGOODS) ×1 IMPLANT
IMMOBILIZER KNEE 20 THIGH 36 (SOFTGOODS) ×1 IMPLANT
INSERT TIB ATTUNE FB SZ4X5 (Insert) IMPLANT
KIT TURNOVER KIT A (KITS) IMPLANT
MANIFOLD NEPTUNE II (INSTRUMENTS) ×1 IMPLANT
NS IRRIG 1000ML POUR BTL (IV SOLUTION) ×1 IMPLANT
PACK TOTAL KNEE CUSTOM (KITS) ×1 IMPLANT
PIN STEINMAN FIXATION KNEE (PIN) IMPLANT
PROTECTOR NERVE ULNAR (MISCELLANEOUS) ×1 IMPLANT
SET HNDPC FAN SPRY TIP SCT (DISPOSABLE) ×1 IMPLANT
SET PAD KNEE POSITIONER (MISCELLANEOUS) ×1 IMPLANT
SPIKE FLUID TRANSFER (MISCELLANEOUS) IMPLANT
SUT STRATAFIX PDS+ 0 24IN (SUTURE) ×1 IMPLANT
SUT VIC AB 0 CT1 36 (SUTURE) ×1 IMPLANT
SUT VIC AB 1 CT1 36 (SUTURE) ×2 IMPLANT
SUT VIC AB 2-0 CT1 TAPERPNT 27 (SUTURE) ×1 IMPLANT
SUT VIC AB 3-0 SH 27X BRD (SUTURE) IMPLANT
TRAY FOLEY MTR SLVR 16FR STAT (SET/KITS/TRAYS/PACK) ×1 IMPLANT
TUBE SUCTION HIGH CAP CLEAR NV (SUCTIONS) ×1 IMPLANT
WATER STERILE IRR 1000ML POUR (IV SOLUTION) ×2 IMPLANT
WRAP KNEE MAXI GEL POST OP (GAUZE/BANDAGES/DRESSINGS) ×1 IMPLANT

## 2023-11-10 NOTE — Anesthesia Procedure Notes (Signed)
 Spinal  Patient location during procedure: OR Start time: 11/10/2023 12:12 PM End time: 11/10/2023 12:17 PM Reason for block: surgical anesthesia Staffing Performed: anesthesiologist  Anesthesiologist: Peggi Bowels, MD Performed by: Peggi Bowels, MD Authorized by: Peggi Bowels, MD   Preanesthetic Checklist Completed: patient identified, IV checked, risks and benefits discussed, surgical consent, monitors and equipment checked, pre-op evaluation and timeout performed Spinal Block Patient position: sitting Prep: DuraPrep Patient monitoring: cardiac monitor, continuous pulse ox and blood pressure Approach: midline Location: L4-5 Injection technique: single-shot Needle Needle type: Pencan  Needle gauge: 24 G Needle length: 9 cm Assessment Sensory level: T10 Events: CSF return Additional Notes Functioning IV was confirmed and monitors were applied. Sterile prep and drape, including hand hygiene and sterile gloves were used. The patient was positioned and the spine was prepped. The skin was anesthetized with lidocaine .  Free flow of clear CSF was obtained prior to injecting local anesthetic into the CSF.  The spinal needle aspirated freely following injection.  The needle was carefully withdrawn.  The patient tolerated the procedure well.

## 2023-11-10 NOTE — Anesthesia Procedure Notes (Signed)
 Anesthesia Regional Block: Adductor canal block   Pre-Anesthetic Checklist: , timeout performed,  Correct Patient, Correct Site, Correct Laterality,  Correct Procedure,, site marked,  Risks and benefits discussed,  Surgical consent,  Pre-op evaluation,  At surgeon's request and post-op pain management  Laterality: Left  Prep: chloraprep       Needles:  Injection technique: Single-shot  Needle Type: Echogenic Stimulator Needle     Needle Length: 10cm  Needle Gauge: 20     Additional Needles:   Procedures:,,,, ultrasound used (permanent image in chart),,    Narrative:  Start time: 11/10/2023 11:20 AM End time: 11/10/2023 11:30 AM Injection made incrementally with aspirations every 5 mL.  Performed by: Personally  Anesthesiologist: Peggi Bowels, MD  Additional Notes: Functioning IV was confirmed and monitors were applied. A time-out was performed. Hand hygiene and sterile gloves were used. The thigh was placed in a frog-leg position and prepped in a sterile fashion. A 20ga Bbraun echogenic stimulator needle was placed using ultrasound guidance.  Negative aspiration and negative test dose prior to incremental administration of local anesthetic. The patient tolerated the procedure well.

## 2023-11-10 NOTE — Interval H&P Note (Signed)
 History and Physical Interval Note:  11/10/2023 9:16 AM  Becky Davis  has presented today for surgery, with the diagnosis of osteoarthritis knee, left.  The various methods of treatment have been discussed with the patient and family. After consideration of risks, benefits and other options for treatment, the patient has consented to  Procedure(s): ARTHROPLASTY, KNEE, TOTAL (Left) as a surgical intervention.  The patient's history has been reviewed, patient examined, no change in status, stable for surgery.  I have reviewed the patient's chart and labs.  Questions were answered to the patient's satisfaction.     Neville Barbone

## 2023-11-10 NOTE — Plan of Care (Signed)
  Problem: Nutrition: Goal: Adequate nutrition will be maintained Outcome: Progressing   Problem: Coping: Goal: Level of anxiety will decrease Outcome: Progressing   Problem: Elimination: Goal: Will not experience complications related to urinary retention Outcome: Progressing   Problem: Safety: Goal: Ability to remain free from injury will improve Outcome: Progressing   Problem: Skin Integrity: Goal: Risk for impaired skin integrity will decrease Outcome: Progressing   

## 2023-11-10 NOTE — Evaluation (Signed)
 Physical Therapy Evaluation Patient Details Name: Becky Davis MRN: [id number] DOB: 08/21/1950 Today's Date: 11/10/2023  History of Present Illness  Pt is a 73 year old female s/p L TKA 11/10/23  Clinical Impression  Pt is s/p TKA resulting in the deficits listed below (see PT Problem List). Pt able to come to sit EOB with CGA. STS at Min A with 2WW. Once standing, is able to complete weight shifting, O2 removed and monitored, remains 95% +. O2 replaced once pt back in bed. She is able to complete scooting along bed to reach better positioning at [facility]. CGA for LE management.  Pt will benefit from acute skilled PT to increase their independence and safety with mobility to allow discharge.          If plan is discharge home, recommend the following: A lot of help with walking and/or transfers;A little help with bathing/dressing/bathroom;Assistance with cooking/housework;Assist for transportation;Help with stairs or ramp for entrance   Can travel by private vehicle        Equipment Recommendations None recommended by PT  Recommendations for Other Services       Functional Status Assessment Patient has had a recent decline in their functional status and demonstrates the ability to make significant improvements in function in a reasonable and predictable amount of time.     Precautions / Restrictions Precautions Precautions: Fall Recall of Precautions/Restrictions: Intact Required Braces or Orthoses: Knee Immobilizer - Left Knee Immobilizer - Left: On when out of bed or walking Restrictions Weight Bearing Restrictions Per Provider Order: Yes LLE Weight Bearing Per Provider Order: Weight bearing as tolerated      Mobility  Bed Mobility Overal bed mobility: Needs Assistance Bed Mobility: Supine to Sit, Sit to Supine     Supine to sit: Contact guard, HOB elevated, Used rails Sit to supine: Contact guard assist        Transfers Overall transfer level: Needs  assistance Equipment used: Rolling walker (2 wheels) Transfers: Sit to/from Stand Sit to Stand: Min assist           General transfer comment: dec WB tolerance through LLE    Ambulation/Gait                  Stairs            Wheelchair Mobility     Tilt Bed    Modified Rankin (Stroke Patients Only)       Balance Overall balance assessment: Needs assistance Sitting-balance support: No upper extremity supported, Feet supported Sitting balance-Leahy Scale: Fair     Standing balance support: Reliant on assistive device for balance, During functional activity, Bilateral upper extremity supported Standing balance-Leahy Scale: Poor                               Pertinent Vitals/Pain Pain Assessment Pain Assessment: 0-10 Pain Score: 2  Pain Location: L knee Pain Descriptors / Indicators: Aching, Operative site guarding, Constant, Sore Pain Intervention(s): Limited activity within patient's tolerance, Monitored during session, Premedicated before session    Home Living Family/patient expects to be discharged to:: Private residence Living Arrangements: Spouse/significant other Available Help at Discharge: Family;Personal care attendant (PCA 9-5 until husband gets home from work) Type of Home: House Home Access: Stairs to enter Entrance Stairs-Rails: None (uses door jam) Entrance Stairs-Number of Steps: 2 Alternate Level Stairs-Number of Steps: 16 Home Layout: Two level Home Equipment: Agricultural consultant (2 wheels);Cane - single  point Additional Comments: pt can stay on the first floor intially    Prior Function Prior Level of Function : Independent/Modified Independent                     Extremity/Trunk Assessment             Cervical / Trunk Assessment Cervical / Trunk Assessment: Kyphotic  Communication   Communication Communication: No apparent difficulties    Cognition Arousal: Alert Behavior During Therapy: WFL for  tasks assessed/performed   PT - Cognitive impairments: No apparent impairments                         Following commands: Intact       Cueing Cueing Techniques: Verbal cues, Gestural cues     General Comments      Exercises     Assessment/Plan    PT Assessment Patient needs continued PT services  PT Problem List Decreased strength;Decreased activity tolerance;Decreased mobility;Decreased range of motion;Decreased balance       PT Treatment Interventions DME instruction;Functional mobility training;Balance training;Patient/family education;Gait training;Therapeutic activities;Stair training;Therapeutic exercise    PT Goals (Current goals can be found in the Care Plan section)  Acute Rehab PT Goals Patient Stated Goal: get up stairs to her bedroom PT Goal Formulation: With patient Time For Goal Achievement: 11/24/23 Potential to Achieve Goals: Good    Frequency Min 5X/week     Co-evaluation               AM-PAC PT "6 Clicks" Mobility  Outcome Measure Help needed turning from your back to your side while in a flat bed without using bedrails?: A Little Help needed moving from lying on your back to sitting on the side of a flat bed without using bedrails?: A Little Help needed moving to and from a bed to a chair (including a wheelchair)?: A Lot Help needed standing up from a chair using your arms (e.g., wheelchair or bedside chair)?: A Little Help needed to walk in hospital room?: A Lot Help needed climbing 3-5 steps with a railing? : Total 6 Click Score: 14    End of Session Equipment Utilized During Treatment: Gait belt Activity Tolerance: Patient tolerated treatment well;Patient limited by pain Patient left: in bed;with call bell/phone within reach Nurse Communication: Mobility status PT Visit Diagnosis: Difficulty in walking, not elsewhere classified (R26.2)    Time: 8295-6213 PT Time Calculation (min) (ACUTE ONLY): 24 min   Charges:   PT  Evaluation $PT Eval Low Complexity: 1 Low PT Treatments $Therapeutic Activity: 8-22 mins PT General Charges $$ ACUTE PT VISIT: 1 Visit         Darien Eden PT Acute Rehabilitation Services Office: [phone] 11/10/2023   Serafin Dames 11/10/2023, 6:44 PM

## 2023-11-10 NOTE — Transfer of Care (Signed)
 Immediate Anesthesia Transfer of Care Note  Patient: Becky Davis  Procedure(s) Performed: ARTHROPLASTY, KNEE, TOTAL (Left: Knee)  Patient Location: PACU  Anesthesia Type:Regional and MAC combined with regional for post-op pain  Level of Consciousness: awake, alert , oriented, and patient cooperative  Airway & Oxygen  Therapy: Patient Spontanous Breathing and Patient connected to face mask oxygen   Post-op Assessment: Report given to RN and Post -op Vital signs reviewed and stable  Post vital signs: Reviewed and stable  Last Vitals:  Vitals Value Taken Time  BP 152/83 11/10/23 1452  Temp    Pulse 73 11/10/23 1454  Resp 17 11/10/23 1454  SpO2 89 % 11/10/23 1454  Vitals shown include unfiled device data.  Last Pain:  Vitals:   11/10/23 1135  TempSrc:   PainSc: 0-No pain         Complications: No notable events documented.

## 2023-11-10 NOTE — Anesthesia Preprocedure Evaluation (Addendum)
 Anesthesia Evaluation  Patient identified by MRN, date of birth, ID band Patient awake    Reviewed: Allergy  & Precautions, NPO status , Patient's Chart, lab work & pertinent test results  Airway Mallampati: II  TM Distance: >3 FB Neck ROM: Full    Dental no notable dental hx.    Pulmonary asthma , sleep apnea and Continuous Positive Airway Pressure Ventilation    Pulmonary exam normal        Cardiovascular hypertension, Pt. on medications Normal cardiovascular exam     Neuro/Psych  Headaches PSYCHIATRIC DISORDERS Anxiety Depression       GI/Hepatic negative GI ROS, Neg liver ROS,,,  Endo/Other  Hypothyroidism    Renal/GU negative Renal ROS     Musculoskeletal  (+) Arthritis ,    Abdominal  (+) + obese  Peds  Hematology negative hematology ROS (+)   Anesthesia Other Findings osteoarthritis knee, left  Reproductive/Obstetrics                             Anesthesia Physical Anesthesia Plan  ASA: 3  Anesthesia Plan: Spinal and Regional   Post-op Pain Management:    Induction:   PONV Risk Score and Plan: 2 and Ondansetron , Dexamethasone , Propofol  infusion and Treatment may vary due to age or medical condition  Airway Management Planned: Simple Face Mask  Additional Equipment:   Intra-op Plan:   Post-operative Plan:   Informed Consent: I have reviewed the patients History and Physical, chart, labs and discussed the procedure including the risks, benefits and alternatives for the proposed anesthesia with the patient or authorized representative who has indicated his/her understanding and acceptance.     Dental advisory given  Plan Discussed with: CRNA  Anesthesia Plan Comments:        Anesthesia Quick Evaluation

## 2023-11-10 NOTE — Op Note (Signed)
 DATE OF SURGERY:  11/10/2023 TIME: 2:36 PM  PATIENT NAME:  Becky Davis   AGE: 73 y.o.    PRE-OPERATIVE DIAGNOSIS:  left knee osteoarthritis  POST-OPERATIVE DIAGNOSIS:  Same  PROCEDURE:  left Total Knee Arthroplasty  SURGEON:  Neville Barbone, MD   ASSISTANT:  [name], PA-C, present and scrubbed throughout the case, critical for assistance with exposure, retraction, instrumentation, and closure.  OPERATIVE IMPLANTS: Implant Name: CEMENT HV SMART SET - [id number] Type: Cement Inv. Item: CEMENT HV SMART SET Serial No.:  Manufacturer: [employer] Lot No.: [id number] LRB: Left No. Used: 2 Action: Implanted   Implant Name: ATTUNE PSFEM LTSZ4 NARCEM KNEE - [id number] Type: Femur Inv. Item: ATTUNE PSFEM LTSZ4 NARCEM KNEE Serial No.:  Manufacturer: [employer] Lot No.: [id number] LRB: Left No. Used: 1 Action: Implanted   Implant Name: INSERT TIB ATTUNE FB SZ4X5 - [id number] Type: Insert Inv. Item: INSERT TIB ATTUNE FB SZ4X5 Serial No.:  Manufacturer: [employer] Lot No.: [id number] LRB: Left No. Used: 1 Action: Implanted   Implant Name: BASEPLATE TIB CMT FB PCKT SZ3 - [id number] Type: Knees Inv. Item: BASEPLATE TIB CMT FB PCKT SZ3 Serial No.:  Manufacturer: [employer] Lot No.: [id number] LRB: Left No. Used: 1 Action: Implanted   Implant Name: ATTUNE MED DOME PAT 32 KNEE - [id number] Type: Knees Inv. Item: ATTUNE MED DOME PAT 32 KNEE Serial No.:  Manufacturer: [employer] Lot No.: [id number] LRB: Left No. Used: 1 Action: Implanted   PREOPERATIVE INDICATIONS:  Becky Davis is a 73 y.o. year old female with end stage bone on bone degenerative arthritis of the knee who failed conservative treatment, including injections, antiinflammatories, activity modification, and assistive devices, and had significant impairment of their activities of daily living, and elected for Total Knee Arthroplasty.   The risks, benefits,  and alternatives were discussed at length including but not limited to the risks of infection, bleeding, nerve injury, stiffness, blood clots, the need for revision surgery, cardiopulmonary complications, among others, and they were willing to proceed.  OPERATIVE FINDINGS AND UNIQUE ASPECTS OF THE CASE: She had severe valgus deformity, with excessive femoral and patellar bone loss.  I cut the femur on 7 degrees of external rotation because of the posterior femoral hypoplasia and wear.  The patella was only 19 mm thick, and was 12.5 mm after the cut.  It was very small.  I cut the femur on 10 mm in order to get enough lateral bone removed, and cut the tibia off of 3 mm removing and referencing from the lateral side.  Assessing the rotation of the tibia was challenging.  Her tibial tubercle was very lateral.  I ended up rotating the tray in order to match the femur, and had excellent patellar tracking at the completion of the case.  ESTIMATED BLOOD LOSS: 150 mL  OPERATIVE DESCRIPTION:  The patient was brought to the operative room and placed in a supine position.  Anesthesia was administered.  IV antibiotics were given.  The lower extremity was prepped and draped in the usual sterile fashion.  Time out was performed.  The leg was elevated and exsanguinated and the tourniquet was inflated.  Anterior quadriceps tendon splitting approach was performed.  The patella was everted and osteophytes were removed.  The anterior horn of the medial and lateral meniscus was removed.   The patella was then measured, and cut with the saw.  The thickness before the cut was 19 and after the cut was 12.5.  A  metal shield was used to protect the patella throughout the case.    The distal femur was opened with the drill and the intramedullary distal femoral cutting jig was utilized, set at 5 degrees resecting 9 mm off the distal femur.  Care was taken to protect the collateral ligaments.  Then the extramedullary tibial  cutting jig was utilized making the appropriate cut using the anterior tibial crest as a reference building in appropriate posterior slope.  Care was taken during the cut to protect the medial and collateral ligaments.  The proximal tibia was removed along with the posterior horns of the menisci.  The PCL was sacrificed.    The extensor gap was measured and found to have adequate resection, measuring to a size 5.    The distal femoral sizing jig was applied, taking care to avoid notching.  This was set at 3 degrees of external rotation.  Then the 4-in-1 cutting jig was applied and the anterior and posterior femur was cut, along with the chamfer cuts.  All posterior osteophytes were removed.  The flexion gap was then measured and was symmetric with the extension gap.  I completed the distal femoral preparation using the appropriate jig to prepare the box.  The proximal tibia sized and prepared accordingly with the reamer and the punch, and then all components were trialed with the poly insert.  The knee was found to have excellent balance and full motion.    The above named components were then cemented into place and all excess cement was removed.  The real polyethylene implant was placed.  After the cement had cured I released the tourniquet and confirmed excellent hemostasis with no major posterior vessel injury.    The knee was easily taken through a range of motion and the patella tracked well and the knee irrigated copiously and the parapatellar tissue closed with Stratafix and vicryl, and subcutaneous tissue closed with vicryl, and monocryl with steri strips for the skin.  The wounds were injected with marcaine , and dressed with sterile gauze and the patient was awakened and returned to the PACU in stable and satisfactory condition.  There were no complications.  Total tourniquet time was approximately 75 minutes.

## 2023-11-11 ENCOUNTER — Encounter (HOSPITAL_COMMUNITY): Payer: Self-pay | Admitting: Orthopedic Surgery

## 2023-11-11 DIAGNOSIS — M1712 Unilateral primary osteoarthritis, left knee: Secondary | ICD-10-CM | POA: Diagnosis not present

## 2023-11-11 DIAGNOSIS — I10 Essential (primary) hypertension: Secondary | ICD-10-CM | POA: Diagnosis not present

## 2023-11-11 DIAGNOSIS — Z96651 Presence of right artificial knee joint: Secondary | ICD-10-CM | POA: Diagnosis not present

## 2023-11-11 DIAGNOSIS — E039 Hypothyroidism, unspecified: Secondary | ICD-10-CM | POA: Diagnosis not present

## 2023-11-11 DIAGNOSIS — Z79899 Other long term (current) drug therapy: Secondary | ICD-10-CM | POA: Diagnosis not present

## 2023-11-11 DIAGNOSIS — J45909 Unspecified asthma, uncomplicated: Secondary | ICD-10-CM | POA: Diagnosis not present

## 2023-11-11 LAB — CBC
HCT: 35.1 % — ABNORMAL LOW (ref 36.0–46.0)
Hemoglobin: 11.5 g/dL — ABNORMAL LOW (ref 12.0–15.0)
MCH: 30.3 pg (ref 26.0–34.0)
MCHC: 32.8 g/dL (ref 30.0–36.0)
MCV: 92.4 fL (ref 80.0–100.0)
Platelets: 233 10*3/uL (ref 150–400)
RBC: 3.8 MIL/uL — ABNORMAL LOW (ref 3.87–5.11)
RDW: 12.9 % (ref 11.5–15.5)
WBC: 12 10*3/uL — ABNORMAL HIGH (ref 4.0–10.5)
nRBC: 0 % (ref 0.0–0.2)

## 2023-11-11 LAB — BASIC METABOLIC PANEL WITH GFR
Anion gap: 11 (ref 5–15)
BUN: 15 mg/dL (ref 8–23)
CO2: 22 mmol/L (ref 22–32)
Calcium: 8.4 mg/dL — ABNORMAL LOW (ref 8.9–10.3)
Chloride: 106 mmol/L (ref 98–111)
Creatinine, Ser: 0.72 mg/dL (ref 0.44–1.00)
GFR, Estimated: >60 mL/min (ref >60)
Glucose, Bld: 141 mg/dL — ABNORMAL HIGH (ref 70–99)
Potassium: 3.5 mmol/L (ref 3.5–5.1)
Sodium: 139 mmol/L (ref 135–145)

## 2023-11-11 MED ORDER — ASPIRIN 325 MG PO TBEC: 325.0000 mg | DELAYED_RELEASE_TABLET | Freq: Two times a day (BID) | ORAL | 0 refills | Status: AC

## 2023-11-11 MED ORDER — POLYETHYLENE GLYCOL 3350 17 G PO PACK
17.0000 g | PACK | Freq: Once | ORAL | Status: DC
  Filled 2023-11-11: qty 1

## 2023-11-11 MED ORDER — ONDANSETRON HCL 4 MG PO TABS: 4.0000 mg | ORAL_TABLET | Freq: Three times a day (TID) | ORAL | 0 refills | Status: AC | PRN

## 2023-11-11 MED ORDER — HYDROCODONE-ACETAMINOPHEN 10-325 MG PO TABS: 1.0000 | ORAL_TABLET | ORAL | 0 refills | Status: AC | PRN

## 2023-11-11 MED ORDER — SENNA-DOCUSATE SODIUM 8.6-50 MG PO TABS: 2.0000 | ORAL_TABLET | Freq: Every day | ORAL | 1 refills | Status: AC

## 2023-11-11 NOTE — Care Management Obs Status (Signed)
 MEDICARE OBSERVATION STATUS NOTIFICATION   Patient Details  Name: Becky Davis MRN: [id number] Date of Birth: [birth year]   Medicare Observation Status Notification Given:  Yes    Bari Leys, RN 11/11/2023, 10:05 AM

## 2023-11-11 NOTE — Anesthesia Postprocedure Evaluation (Signed)
 Anesthesia Post Note  Patient: Becky Davis  Procedure(s) Performed: ARTHROPLASTY, KNEE, TOTAL (Left: Knee)     Patient location during evaluation: PACU Anesthesia Type: Regional and Spinal Level of consciousness: awake Pain management: pain level controlled Vital Signs Assessment: post-procedure vital signs reviewed and stable Respiratory status: spontaneous breathing, nonlabored ventilation and respiratory function stable Cardiovascular status: blood pressure returned to baseline and stable Postop Assessment: no apparent nausea or vomiting Anesthetic complications: no   No notable events documented.  Last Vitals:  Vitals:   11/10/23 2104 11/11/23 0126  BP: (!) 146/86 (!) 141/61  Pulse: 70 73  Resp: 16 15  Temp: 36.4 C 36.8 C  SpO2: 98% 97%    Last Pain:  Vitals:   11/11/23 0425  TempSrc:   PainSc: 0-No pain                 Teddi Badalamenti P Catie Chiao

## 2023-11-11 NOTE — Discharge Summary (Signed)
 Discharge Summary  Patient ID: Becky Davis MRN: [id number] DOB/AGE: Feb 02, 1951 73 y.o.  Admit date: 11/10/2023 Discharge date: 11/11/2023  Admission Diagnoses:  S/P total knee arthroplasty, left  Discharge Diagnoses:  Principal Problem:   S/P total knee arthroplasty, left Active Problems:   S/P TKR (total knee replacement), left   Past Medical History:  Diagnosis Date   Anxiety    Arthritis    knees   Asthma    Depression    GERD (gastroesophageal reflux disease)    Headache    hx of 20 years ago   Heart murmur    hx of 10-15 years ago   Hypertension    Hypothyroidism    Pneumonia    Pre-diabetes    Thyroid  nodule     Surgeries: Procedure(s): ARTHROPLASTY, KNEE, TOTAL on 11/10/2023   Consultants (if any):   Discharged Condition: Improved  Hospital Course: Becky Davis is an 73 y.o. female who was admitted 11/10/2023 with a diagnosis of S/P total knee arthroplasty, left and went to the operating room on 11/10/2023 and underwent the above named procedures.    She was given perioperative antibiotics:  Anti-infectives (From admission, onward)    Start     Dose/Rate Route Frequency Ordered Stop   11/10/23 1800  ceFAZolin  (ANCEF ) IVPB 2g/100 mL premix        2 g 200 mL/hr over 30 Minutes Intravenous Every 6 hours 11/10/23 1550 11/10/23 2340   11/10/23 0930  ceFAZolin  (ANCEF ) IVPB 2g/100 mL premix        2 g 200 mL/hr over 30 Minutes Intravenous On call to O.R. 11/10/23 0919 11/10/23 1219     .  She was given sequential compression devices, early ambulation, and aspirin  for DVT prophylaxis.  She benefited maximally from the hospital stay and there were no complications.    Recent vital signs:  Vitals:   11/11/23 0641 11/11/23 1331  BP: (!) 141/68 (!) 148/85  Pulse: 61 72  Resp: 15 18  Temp: 98.2 F (36.8 C) 97.6 F (36.4 C)  SpO2: 98% 100%    Recent laboratory studies:  Lab Results  Component Value Date   HGB 11.5 (L) 11/11/2023   HGB 13.4  10/28/2023   HGB 13.3 07/16/2021   Lab Results  Component Value Date   WBC 12.0 (H) 11/11/2023   PLT 233 11/11/2023   No results found for: "INR" Lab Results  Component Value Date   NA 139 11/11/2023   K 3.5 11/11/2023   CL 106 11/11/2023   CO2 22 11/11/2023   BUN 15 11/11/2023   CREATININE 0.72 11/11/2023   GLUCOSE 141 (H) 11/11/2023    Discharge Medications:   Allergies as of 11/11/2023       Reactions   Amlodipine Itching   Dyazide [hydrochlorothiazide -triamterene]    Fainting    Lisinopril Other (See Comments)   Sneezing    Prednisone     Had depression in 1991 after extended use of predisone. Pt has taken this medication since in a short time period, and has had no reaction.    Juniper Oil Rash   Penicillins Rash   Patient received ancef  on 12/20/19 with no adverse reaction.        Medication List     TAKE these medications    albuterol  108 (90 Base) MCG/ACT inhaler Commonly known as: ProAir  HFA Inhale 2 puffs into the lungs as needed (Shortness of breath).   amantadine 100 MG capsule Commonly known as: SYMMETREL Take 100  mg by mouth 2 (two) times daily.   aspirin  EC 325 MG tablet Take 1 tablet (325 mg total) by mouth 2 (two) times daily.   Breo Ellipta  100-25 MCG/ACT Aepb Generic drug: fluticasone  furoate-vilanterol Inhale 1 puff into the lungs daily.   calcitRIOL  0.25 MCG capsule Commonly known as: ROCALTROL  Take 0.25 mcg by mouth 2 (two) times daily.   CALTRATE 600+D PO Take 600 mg by mouth daily.   cetirizine 10 MG tablet Commonly known as: ZYRTEC Take 10 mg by mouth daily.   donepezil  5 MG tablet Commonly known as: ARICEPT  Take 1 tablet (5 mg total) by mouth at bedtime.   hydrALAZINE  25 MG tablet Commonly known as: APRESOLINE  Take 25 mg by mouth 3 (three) times daily.   HYDROcodone -acetaminophen  10-325 MG tablet Commonly known as: NORCO Take 1 tablet by mouth every 4 (four) hours as needed.   levothyroxine  112 MCG tablet Commonly  known as: SYNTHROID  Take 112 mcg by mouth at bedtime.   losartan  100 MG tablet Commonly known as: COZAAR  Take 0.5 tablets (50 mg total) by mouth daily. What changed: how much to take   lurasidone 40 MG Tabs tablet Commonly known as: LATUDA Take 40 mg by mouth at bedtime.   mirtazapine  15 MG tablet Commonly known as: REMERON  Take 1 tablet (15 mg total) by mouth at bedtime.   montelukast  10 MG tablet Commonly known as: SINGULAIR  Take 10 mg by mouth at bedtime.   ondansetron  4 MG tablet Commonly known as: Zofran  Take 1 tablet (4 mg total) by mouth every 8 (eight) hours as needed for nausea or vomiting.   rosuvastatin 10 MG tablet Commonly known as: CRESTOR Take 1 tablet by mouth daily.   sennosides-docusate sodium  8.6-50 MG tablet Commonly known as: SENOKOT-S Take 2 tablets by mouth daily.        Diagnostic Studies: DG Knee Left Port Result Date: 11/10/2023 CLINICAL DATA:  Status post total knee replacement. EXAM: PORTABLE LEFT KNEE - 1-2 VIEW COMPARISON:  None Available. FINDINGS: Left knee arthroplasty in expected alignment. No periprosthetic lucency or fracture. There has been patellar resurfacing. Recent postsurgical change includes air and edema in the soft tissues and joint space. IMPRESSION: Left knee arthroplasty without immediate postoperative complication. Electronically Signed   By: Chadwick Colonel M.D.   On: 11/10/2023 16:44    Disposition: Discharge disposition: 01-Home or Self Care          Follow-up Information     Osa Blase, MD. Go on 11/25/2023.   Specialty: Orthopedic Surgery Why: your appointment is scheduled for 10:45 Contact information: [address] [place] [address] [phone]         [facility] Follow up.   Why: Dana Duncan, PT from our office will see you at home for 4 visits prior to starting in the outpatient clinic Contact information: [facility] [address] [place] [address] [phone]                  Signed: Johny Nap 11/11/2023, 4:40 PM

## 2023-11-11 NOTE — Progress Notes (Signed)
 Physical Therapy Treatment Patient Details Name: Becky Davis MRN: [id number] DOB: 19-Mar-1951 Today's Date: 11/11/2023   History of Present Illness Pt is a 73 year old female s/p L TKA 11/10/23    PT Comments  POD # 1 am session AxO x 3 pleasant Lady who lives home with Spouse.  Assisted OOB to amb required increased time.  Pt wearing her KI.  Self able to rise with minimal VC's.  General transfer comment: <25% VC's on proper hand placement and safety with turns.  Slow and steady. General Gait Details: assisted with amb using KI a limited distance of 28 feet due to fatigue.  Pt reports poor sleep last night.  VC's on proper walker to self distance and upright posture. Then returned to room to perform some TE's following HEP handout.  Instructed on proper tech, freq as well as use of ICE.   Pt will need another PT session for stair training and attempt to amb without KI.     If plan is discharge home, recommend the following: A lot of help with walking and/or transfers;A little help with bathing/dressing/bathroom;Assistance with cooking/housework;Assist for transportation;Help with stairs or ramp for entrance   Can travel by private vehicle        Equipment Recommendations  None recommended by PT    Recommendations for Other Services       Precautions / Restrictions Precautions Precautions: Fall Precaution/Restrictions Comments: no pillow under knee Restrictions Weight Bearing Restrictions Per Provider Order: No LLE Weight Bearing Per Provider Order: Weight bearing as tolerated     Mobility  Bed Mobility Overal bed mobility: Needs Assistance Bed Mobility: Supine to Sit, Sit to Supine     Supine to sit: Supervision, Contact guard Sit to supine: Supervision, Contact guard assist   General bed mobility comments: self able with increased time and use of strap to guide LE    Transfers Overall transfer level: Needs assistance Equipment used: Rolling walker (2  wheels) Transfers: Sit to/from Stand Sit to Stand: Supervision, Contact guard assist           General transfer comment: <25% VC's on proper hand placement and safety with turns.  Slow and steady.    Ambulation/Gait Ambulation/Gait assistance: Supervision, Contact guard assist Gait Distance (Feet): 28 Feet Assistive device: Rolling walker (2 wheels) Gait Pattern/deviations: Step-to pattern, Decreased stance time - left Gait velocity: decreased     General Gait Details: assisted with amb using KI a limited distance of 28 feet due to fatigue.  Pt reports poor sleep last night.  VC's on proper walker to self distance and upright posture.   Stairs             Wheelchair Mobility     Tilt Bed    Modified Rankin (Stroke Patients Only)       Balance                                            Communication Communication Communication: No apparent difficulties  Cognition Arousal: Alert Behavior During Therapy: WFL for tasks assessed/performed   PT - Cognitive impairments: No apparent impairments                       PT - Cognition Comments: AxO x 3 pleasant Lady who lives home with Spouse Following commands: Intact      Cueing  Cueing Techniques: Verbal cues, Gestural cues  Exercises  Total Knee Replacement TE's following HEP handout 10 reps B LE ankle pumps 05 reps towel squeezes 05 reps knee presses 05 reps heel slides  05 reps SAQ's 05 reps SLR's 05 reps ABD Educated on use of gait belt to assist with TE's Followed by ICE     General Comments        Pertinent Vitals/Pain Pain Assessment Pain Assessment: 0-10 Pain Score: 5  Pain Location: L knee Pain Descriptors / Indicators: Aching, Operative site guarding, Constant, Sore Pain Intervention(s): Monitored during session, Premedicated before session, Repositioned, Ice applied    Home Living                          Prior Function            PT Goals  (current goals can now be found in the care plan section) Progress towards PT goals: Progressing toward goals    Frequency    Min 5X/week      PT Plan      Co-evaluation              AM-PAC PT "6 Clicks" Mobility   Outcome Measure  Help needed turning from your back to your side while in a flat bed without using bedrails?: A Little Help needed moving from lying on your back to sitting on the side of a flat bed without using bedrails?: A Little Help needed moving to and from a bed to a chair (including a wheelchair)?: A Little Help needed standing up from a chair using your arms (e.g., wheelchair or bedside chair)?: A Little Help needed to walk in hospital room?: A Little Help needed climbing 3-5 steps with a railing? : A Lot 6 Click Score: 17    End of Session Equipment Utilized During Treatment: Gait belt Activity Tolerance: Patient tolerated treatment well;Patient limited by fatigue Patient left: in bed;with call bell/phone within reach Nurse Communication: Mobility status PT Visit Diagnosis: Difficulty in walking, not elsewhere classified (R26.2)     Time: 1610-9604 PT Time Calculation (min) (ACUTE ONLY): 26 min  Charges:    $Gait Training: 8-22 mins $Therapeutic Exercise: 8-22 mins PT General Charges $$ ACUTE PT VISIT: 1 Visit                     Bess Broody  PTA Acute  Rehabilitation Services Office M-F          [phone]

## 2023-11-11 NOTE — Progress Notes (Signed)
 Subjective: 1 Day Post-Op s/p Procedure(s): ARTHROPLASTY, KNEE, TOTAL   Patient is alert, oriented. Pain severe this morning because she just got up to the bathroom and back to the bed, but otherwise has been well controlled. No BM yet, worried about constipation and would like some miralax  today. Voiding on own. Denies chest pain, SOB, Calf pain. No nausea/vomiting. No other complaints. Eager to get home today.    Objective:  PE: VITALS:   Vitals:   11/10/23 1833 11/10/23 2104 11/11/23 0126 11/11/23 0641  BP: (!) 155/92 (!) 146/86 (!) 141/61 (!) 141/68  Pulse: 73 70 73 61  Resp: 16 16 15 15   Temp: 98.4 F (36.9 C) 97.6 F (36.4 C) 98.2 F (36.8 C) 98.2 F (36.8 C)  TempSrc:   Oral   SpO2: 98% 98% 97% 98%  Weight:      Height:        ABD soft Sensation intact distally Intact pulses distally Dorsiflexion/Plantar flexion intact Incision: moderate drainage  LABS  Results for orders placed or performed during the hospital encounter of 11/10/23 (from the past 24 hours)  CBC     Status: Abnormal   Collection Time: 11/11/23  3:25 AM  Result Value Ref Range   WBC 12.0 (H) 4.0 - 10.5 K/uL   RBC 3.80 (L) 3.87 - 5.11 MIL/uL   Hemoglobin 11.5 (L) 12.0 - 15.0 g/dL   HCT 95.2 (L) 84.1 - 32.4 %   MCV 92.4 80.0 - 100.0 fL   MCH 30.3 26.0 - 34.0 pg   MCHC 32.8 30.0 - 36.0 g/dL   RDW 40.1 02.7 - 25.3 %   Platelets 233 150 - 400 K/uL   nRBC 0.0 0.0 - 0.2 %  Basic metabolic panel     Status: Abnormal   Collection Time: 11/11/23  3:25 AM  Result Value Ref Range   Sodium 139 135 - 145 mmol/L   Potassium 3.5 3.5 - 5.1 mmol/L   Chloride 106 98 - 111 mmol/L   CO2 22 22 - 32 mmol/L   Glucose, Bld 141 (H) 70 - 99 mg/dL   BUN 15 8 - 23 mg/dL   Creatinine, Ser 6.64 0.44 - 1.00 mg/dL   Calcium  8.4 (L) 8.9 - 10.3 mg/dL   GFR, Estimated >40 >34 mL/min   Anion gap 11 5 - 15    DG Knee Left Port Result Date: 11/10/2023 CLINICAL DATA:  Status post total knee replacement. EXAM:  PORTABLE LEFT KNEE - 1-2 VIEW COMPARISON:  None Available. FINDINGS: Left knee arthroplasty in expected alignment. No periprosthetic lucency or fracture. There has been patellar resurfacing. Recent postsurgical change includes air and edema in the soft tissues and joint space. IMPRESSION: Left knee arthroplasty without immediate postoperative complication. Electronically Signed   By: Chadwick Colonel M.D.   On: 11/10/2023 16:44    Assessment/Plan: Principal Problem:   S/P total knee arthroplasty, left Active Problems:   S/P TKR (total knee replacement), left   1 Day Post-Op s/p Procedure(s): ARTHROPLASTY, KNEE, TOTAL  Weightbearing: WBAT LLE Insicional and dressing care: Mepilex with moderate drainage, replaced with new aquacell today. Reinforce as needed. VTE prophylaxis: Aspirin  325mg  BID x 30 days Pain control: low dose regimen, continue current plan Follow - up plan: 2 weeks with Dr. Agatha Horsfall Dispo: pending progress with PT today. Hopefully to discharge home after afternoon session.    Contact information:   Becky Davis, Becky Davis [id number] [id number]  After hours and holidays please check [email or web address] for group  call information for [facility]  Becky Davis 11/11/2023, 8:05 AM

## 2023-11-11 NOTE — TOC Transition Note (Signed)
 Transition of Care [facility]) - Discharge Note   Patient Details  Name: Becky Davis MRN: [id number] Date of Birth: Nov 27, 1950  [facility] of [facility] CM/SW Contact:  Bari Leys, RN Phone Number: 11/11/2023, 10:22 AM   Clinical Narrative:   Met with patient at bedside to review dc therapy and home equipment needs, pt confirmed she has a RW, OPPT [employer] No TOC needs.     Final next level of care: OP Rehab Barriers to Discharge: No Barriers Identified   Patient Goals and CMS Choice Patient states their goals for this hospitalization and ongoing recovery are:: return home          Discharge Placement                       Discharge Plan and Services Additional resources added to the After Visit Summary for                                       Social Drivers of Health (SDOH) Interventions SDOH Screenings   Food Insecurity: No Food Insecurity (11/10/2023)  Housing: Low Risk  (11/10/2023)  Transportation Needs: No Transportation Needs (11/10/2023)  Utilities: Not At Risk (11/10/2023)  Alcohol Screen: Low Risk  (07/17/2021)  Depression (PHQ2-9): Medium Risk (07/17/2021)  Financial Resource Strain: Low Risk  (10/30/2023)   Received from [facility]  Physical Activity: Insufficiently Active (10/30/2023)   Received from [facility]  Social Connections: Moderately Isolated (11/10/2023)  Stress: No Stress Concern Present (10/30/2023)   [facility]  Tobacco Use: Low Risk  (11/10/2023)     Readmission Risk Interventions     No data to display

## 2023-11-11 NOTE — Progress Notes (Addendum)
 Physical Therapy Treatment Patient Details Name: Becky Davis MRN: [id number] DOB: Dec 06, 1950 Today's Date: 11/11/2023   History of Present Illness Pt is a 73 year old female s/p L TKA 11/10/23    PT Comments  POD # 1 pm session Removed KI.  Assisted OOB.  General bed mobility comments: self able with increased time and use of strap to guide LE.  Assisted with amb in hallway.  General Gait Details: amb without KI VC's to take "smaller steps" to ensure increased knee stability.  Pt did well.  Tolerated a functional distance 30 feet. Practiced stairs.  General stair comments: Pt has 2 steps NO RAILS to enter her home. Performed with walker with "friend" present to secure walker.  Performed twice.  Tolerated well.  Pt with good recall. Addressed all mobility questions, discussed appropriate activity, educated on use of ICE.   Pt ready for D/C to home. Pt calling Spouse to come pick her up.  Friend in room.  Pt stated her husband fell this morning over his exercise step and currently was not answering his phone.  Left Pt in room with RN.    If plan is discharge home, recommend the following: A lot of help with walking and/or transfers;A little help with bathing/dressing/bathroom;Assistance with cooking/housework;Assist for transportation;Help with stairs or ramp for entrance   Can travel by private vehicle        Equipment Recommendations  None recommended by PT    Recommendations for Other Services       Precautions / Restrictions Precautions Precautions: Fall Precaution/Restrictions Comments: no pillow under knee Restrictions Weight Bearing Restrictions Per Provider Order: No LLE Weight Bearing Per Provider Order: Weight bearing as tolerated     Mobility  Bed Mobility Overal bed mobility: Needs Assistance Bed Mobility: Supine to Sit, Sit to Supine     Supine to sit: Supervision, Contact guard    General bed mobility comments: self able with increased time and use of strap  to guide LE    Transfers Overall transfer level: Needs assistance Equipment used: Rolling walker (2 wheels) Transfers: Sit to/from Stand Sit to Stand: Supervision, Contact guard assist           General transfer comment: <25% VC's on proper hand placement and safety with turns.  Slow and steady.    Ambulation/Gait Ambulation/Gait assistance: Supervision, Contact guard assist Gait Distance (Feet): 30 Feet Assistive device: Rolling walker (2 wheels) Gait Pattern/deviations: Step-to pattern, Decreased stance time - left Gait velocity: decreased     General Gait Details: amb without KI VC's to take "smaller steps" to ensure increased knee stability.  Pt did well.  Tolerated a functional distance 30 feet.   Stairs Stairs: Yes Stairs assistance: Min assist Stair Management: No rails, Step to pattern, Forwards, With walker Number of Stairs: 2 General stair comments: Pt has 2 steps NO RAILS to enter her home. Performed with walker with "friend" present to secure walker.  Performed twice.  Tolerated well.  Pt with good recall.   Wheelchair Mobility     Tilt Bed    Modified Rankin (Stroke Patients Only)       Balance                                            Communication Communication Communication: No apparent difficulties  Cognition Arousal: Alert Behavior During Therapy: WFL for tasks assessed/performed  PT - Cognitive impairments: No apparent impairments                       PT - Cognition Comments: AxO x 3 pleasant Lady who lives home with Spouse Following commands: Intact      Cueing Cueing Techniques: Verbal cues, Gestural cues  Exercises      General Comments        Pertinent Vitals/Pain Pain Assessment Pain Assessment: 0-10 Pain Score: 5  Pain Location: L knee Pain Descriptors / Indicators: Aching, Operative site guarding, Constant, Sore Pain Intervention(s): Monitored during session, Premedicated before  session, Repositioned, Ice applied    Home Living                          Prior Function            PT Goals (current goals can now be found in the care plan section) Progress towards PT goals: Progressing toward goals    Frequency    Min 5X/week      PT Plan      Co-evaluation              AM-PAC PT "6 Clicks" Mobility   Outcome Measure  Help needed turning from your back to your side while in a flat bed without using bedrails?: A Little Help needed moving from lying on your back to sitting on the side of a flat bed without using bedrails?: A Little Help needed moving to and from a bed to a chair (including a wheelchair)?: A Little Help needed standing up from a chair using your arms (e.g., wheelchair or bedside chair)?: A Little Help needed to walk in hospital room?: A Little Help needed climbing 3-5 steps with a railing? : A Lot 6 Click Score: 17    End of Session Equipment Utilized During Treatment: Gait belt Activity Tolerance: Patient tolerated treatment well;Patient limited by fatigue Patient left: in bed;with call bell/phone within reach Nurse Communication: Mobility status PT Visit Diagnosis: Difficulty in walking, not elsewhere classified (R26.2)     Time: 1610-9604 PT Time Calculation (min) (ACUTE ONLY): 24 min  Charges:    $Gait Training: 8-22 mins $Therapeutic Activity: 8-22 mins PT General Charges $$ ACUTE PT VISIT: 1 Visit                     Bess Broody  PTA Acute  Rehabilitation Services Office M-F          [phone]

## 2023-11-12 DIAGNOSIS — F419 Anxiety disorder, unspecified: Secondary | ICD-10-CM | POA: Diagnosis not present

## 2023-11-12 DIAGNOSIS — Z471 Aftercare following joint replacement surgery: Secondary | ICD-10-CM | POA: Diagnosis not present

## 2023-11-12 DIAGNOSIS — Z7951 Long term (current) use of inhaled steroids: Secondary | ICD-10-CM | POA: Diagnosis not present

## 2023-11-12 DIAGNOSIS — J45909 Unspecified asthma, uncomplicated: Secondary | ICD-10-CM | POA: Diagnosis not present

## 2023-11-12 DIAGNOSIS — F32A Depression, unspecified: Secondary | ICD-10-CM | POA: Diagnosis not present

## 2023-11-12 DIAGNOSIS — I10 Essential (primary) hypertension: Secondary | ICD-10-CM | POA: Diagnosis not present

## 2023-11-12 DIAGNOSIS — Z7982 Long term (current) use of aspirin: Secondary | ICD-10-CM | POA: Diagnosis not present

## 2023-11-12 DIAGNOSIS — Z96653 Presence of artificial knee joint, bilateral: Secondary | ICD-10-CM | POA: Diagnosis not present

## 2023-11-12 DIAGNOSIS — E039 Hypothyroidism, unspecified: Secondary | ICD-10-CM | POA: Diagnosis not present

## 2023-11-12 DIAGNOSIS — Z79891 Long term (current) use of opiate analgesic: Secondary | ICD-10-CM | POA: Diagnosis not present

## 2023-11-13 DIAGNOSIS — F419 Anxiety disorder, unspecified: Secondary | ICD-10-CM | POA: Diagnosis not present

## 2023-11-13 DIAGNOSIS — I10 Essential (primary) hypertension: Secondary | ICD-10-CM | POA: Diagnosis not present

## 2023-11-13 DIAGNOSIS — Z471 Aftercare following joint replacement surgery: Secondary | ICD-10-CM | POA: Diagnosis not present

## 2023-11-13 DIAGNOSIS — Z96653 Presence of artificial knee joint, bilateral: Secondary | ICD-10-CM | POA: Diagnosis not present

## 2023-11-13 DIAGNOSIS — J45909 Unspecified asthma, uncomplicated: Secondary | ICD-10-CM | POA: Diagnosis not present

## 2023-11-13 DIAGNOSIS — F32A Depression, unspecified: Secondary | ICD-10-CM | POA: Diagnosis not present

## 2023-11-16 DIAGNOSIS — F419 Anxiety disorder, unspecified: Secondary | ICD-10-CM | POA: Diagnosis not present

## 2023-11-16 DIAGNOSIS — I10 Essential (primary) hypertension: Secondary | ICD-10-CM | POA: Diagnosis not present

## 2023-11-16 DIAGNOSIS — J45909 Unspecified asthma, uncomplicated: Secondary | ICD-10-CM | POA: Diagnosis not present

## 2023-11-16 DIAGNOSIS — Z96653 Presence of artificial knee joint, bilateral: Secondary | ICD-10-CM | POA: Diagnosis not present

## 2023-11-16 DIAGNOSIS — Z471 Aftercare following joint replacement surgery: Secondary | ICD-10-CM | POA: Diagnosis not present

## 2023-11-16 DIAGNOSIS — F32A Depression, unspecified: Secondary | ICD-10-CM | POA: Diagnosis not present

## 2023-11-18 DIAGNOSIS — J45909 Unspecified asthma, uncomplicated: Secondary | ICD-10-CM | POA: Diagnosis not present

## 2023-11-18 DIAGNOSIS — F32A Depression, unspecified: Secondary | ICD-10-CM | POA: Diagnosis not present

## 2023-11-18 DIAGNOSIS — Z471 Aftercare following joint replacement surgery: Secondary | ICD-10-CM | POA: Diagnosis not present

## 2023-11-18 DIAGNOSIS — I10 Essential (primary) hypertension: Secondary | ICD-10-CM | POA: Diagnosis not present

## 2023-11-18 DIAGNOSIS — F419 Anxiety disorder, unspecified: Secondary | ICD-10-CM | POA: Diagnosis not present

## 2023-11-18 DIAGNOSIS — Z96653 Presence of artificial knee joint, bilateral: Secondary | ICD-10-CM | POA: Diagnosis not present

## 2023-11-19 DIAGNOSIS — J3081 Allergic rhinitis due to animal (cat) (dog) hair and dander: Secondary | ICD-10-CM | POA: Diagnosis not present

## 2023-11-19 DIAGNOSIS — J301 Allergic rhinitis due to pollen: Secondary | ICD-10-CM | POA: Diagnosis not present

## 2023-11-19 DIAGNOSIS — J3089 Other allergic rhinitis: Secondary | ICD-10-CM | POA: Diagnosis not present

## 2023-11-20 DIAGNOSIS — Z09 Encounter for follow-up examination after completed treatment for conditions other than malignant neoplasm: Secondary | ICD-10-CM | POA: Diagnosis not present

## 2023-11-20 DIAGNOSIS — I10 Essential (primary) hypertension: Secondary | ICD-10-CM | POA: Diagnosis not present

## 2023-11-20 DIAGNOSIS — F419 Anxiety disorder, unspecified: Secondary | ICD-10-CM | POA: Diagnosis not present

## 2023-11-20 DIAGNOSIS — Z96653 Presence of artificial knee joint, bilateral: Secondary | ICD-10-CM | POA: Diagnosis not present

## 2023-11-20 DIAGNOSIS — Z96652 Presence of left artificial knee joint: Secondary | ICD-10-CM | POA: Diagnosis not present

## 2023-11-20 DIAGNOSIS — J45909 Unspecified asthma, uncomplicated: Secondary | ICD-10-CM | POA: Diagnosis not present

## 2023-11-20 DIAGNOSIS — Z471 Aftercare following joint replacement surgery: Secondary | ICD-10-CM | POA: Diagnosis not present

## 2023-11-20 DIAGNOSIS — F32A Depression, unspecified: Secondary | ICD-10-CM | POA: Diagnosis not present

## 2023-11-23 DIAGNOSIS — Z471 Aftercare following joint replacement surgery: Secondary | ICD-10-CM | POA: Diagnosis not present

## 2023-11-23 DIAGNOSIS — F419 Anxiety disorder, unspecified: Secondary | ICD-10-CM | POA: Diagnosis not present

## 2023-11-23 DIAGNOSIS — J45909 Unspecified asthma, uncomplicated: Secondary | ICD-10-CM | POA: Diagnosis not present

## 2023-11-23 DIAGNOSIS — I10 Essential (primary) hypertension: Secondary | ICD-10-CM | POA: Diagnosis not present

## 2023-11-23 DIAGNOSIS — Z96653 Presence of artificial knee joint, bilateral: Secondary | ICD-10-CM | POA: Diagnosis not present

## 2023-11-23 DIAGNOSIS — F32A Depression, unspecified: Secondary | ICD-10-CM | POA: Diagnosis not present

## 2023-11-25 DIAGNOSIS — M1712 Unilateral primary osteoarthritis, left knee: Secondary | ICD-10-CM | POA: Diagnosis not present

## 2023-11-30 DIAGNOSIS — J3081 Allergic rhinitis due to animal (cat) (dog) hair and dander: Secondary | ICD-10-CM | POA: Diagnosis not present

## 2023-11-30 DIAGNOSIS — M1712 Unilateral primary osteoarthritis, left knee: Secondary | ICD-10-CM | POA: Diagnosis not present

## 2023-11-30 DIAGNOSIS — J301 Allergic rhinitis due to pollen: Secondary | ICD-10-CM | POA: Diagnosis not present

## 2023-11-30 DIAGNOSIS — J3089 Other allergic rhinitis: Secondary | ICD-10-CM | POA: Diagnosis not present

## 2023-12-02 DIAGNOSIS — M1712 Unilateral primary osteoarthritis, left knee: Secondary | ICD-10-CM | POA: Diagnosis not present

## 2023-12-09 DIAGNOSIS — M1712 Unilateral primary osteoarthritis, left knee: Secondary | ICD-10-CM | POA: Diagnosis not present

## 2023-12-11 DIAGNOSIS — M1712 Unilateral primary osteoarthritis, left knee: Secondary | ICD-10-CM | POA: Diagnosis not present

## 2023-12-14 DIAGNOSIS — J3089 Other allergic rhinitis: Secondary | ICD-10-CM | POA: Diagnosis not present

## 2023-12-14 DIAGNOSIS — J301 Allergic rhinitis due to pollen: Secondary | ICD-10-CM | POA: Diagnosis not present

## 2023-12-14 DIAGNOSIS — J3081 Allergic rhinitis due to animal (cat) (dog) hair and dander: Secondary | ICD-10-CM | POA: Diagnosis not present

## 2023-12-16 DIAGNOSIS — M1712 Unilateral primary osteoarthritis, left knee: Secondary | ICD-10-CM | POA: Diagnosis not present

## 2023-12-18 DIAGNOSIS — M1712 Unilateral primary osteoarthritis, left knee: Secondary | ICD-10-CM | POA: Diagnosis not present

## 2023-12-23 DIAGNOSIS — M1712 Unilateral primary osteoarthritis, left knee: Secondary | ICD-10-CM | POA: Diagnosis not present

## 2023-12-25 DIAGNOSIS — M1712 Unilateral primary osteoarthritis, left knee: Secondary | ICD-10-CM | POA: Diagnosis not present

## 2023-12-28 DIAGNOSIS — J301 Allergic rhinitis due to pollen: Secondary | ICD-10-CM | POA: Diagnosis not present

## 2023-12-28 DIAGNOSIS — J3081 Allergic rhinitis due to animal (cat) (dog) hair and dander: Secondary | ICD-10-CM | POA: Diagnosis not present

## 2023-12-28 DIAGNOSIS — J3089 Other allergic rhinitis: Secondary | ICD-10-CM | POA: Diagnosis not present

## 2023-12-30 DIAGNOSIS — M1712 Unilateral primary osteoarthritis, left knee: Secondary | ICD-10-CM | POA: Diagnosis not present

## 2024-01-01 DIAGNOSIS — M1712 Unilateral primary osteoarthritis, left knee: Secondary | ICD-10-CM | POA: Diagnosis not present

## 2024-01-06 DIAGNOSIS — M1712 Unilateral primary osteoarthritis, left knee: Secondary | ICD-10-CM | POA: Diagnosis not present

## 2024-01-08 DIAGNOSIS — M1712 Unilateral primary osteoarthritis, left knee: Secondary | ICD-10-CM | POA: Diagnosis not present

## 2024-01-11 DIAGNOSIS — J3089 Other allergic rhinitis: Secondary | ICD-10-CM | POA: Diagnosis not present

## 2024-01-11 DIAGNOSIS — J301 Allergic rhinitis due to pollen: Secondary | ICD-10-CM | POA: Diagnosis not present

## 2024-01-11 DIAGNOSIS — J3081 Allergic rhinitis due to animal (cat) (dog) hair and dander: Secondary | ICD-10-CM | POA: Diagnosis not present

## 2024-01-11 DIAGNOSIS — M1712 Unilateral primary osteoarthritis, left knee: Secondary | ICD-10-CM | POA: Diagnosis not present

## 2024-01-13 DIAGNOSIS — M1712 Unilateral primary osteoarthritis, left knee: Secondary | ICD-10-CM | POA: Diagnosis not present

## 2024-01-14 DIAGNOSIS — Z6831 Body mass index (BMI) 31.0-31.9, adult: Secondary | ICD-10-CM | POA: Diagnosis not present

## 2024-01-14 DIAGNOSIS — F3342 Major depressive disorder, recurrent, in full remission: Secondary | ICD-10-CM | POA: Diagnosis not present

## 2024-01-14 DIAGNOSIS — I10 Essential (primary) hypertension: Secondary | ICD-10-CM | POA: Diagnosis not present

## 2024-01-14 DIAGNOSIS — E782 Mixed hyperlipidemia: Secondary | ICD-10-CM | POA: Diagnosis not present

## 2024-01-18 DIAGNOSIS — M1712 Unilateral primary osteoarthritis, left knee: Secondary | ICD-10-CM | POA: Diagnosis not present

## 2024-01-25 DIAGNOSIS — J3081 Allergic rhinitis due to animal (cat) (dog) hair and dander: Secondary | ICD-10-CM | POA: Diagnosis not present

## 2024-01-25 DIAGNOSIS — J3089 Other allergic rhinitis: Secondary | ICD-10-CM | POA: Diagnosis not present

## 2024-01-25 DIAGNOSIS — J301 Allergic rhinitis due to pollen: Secondary | ICD-10-CM | POA: Diagnosis not present

## 2024-02-08 DIAGNOSIS — J3089 Other allergic rhinitis: Secondary | ICD-10-CM | POA: Diagnosis not present

## 2024-02-08 DIAGNOSIS — M1712 Unilateral primary osteoarthritis, left knee: Secondary | ICD-10-CM | POA: Diagnosis not present

## 2024-02-08 DIAGNOSIS — J3081 Allergic rhinitis due to animal (cat) (dog) hair and dander: Secondary | ICD-10-CM | POA: Diagnosis not present

## 2024-02-08 DIAGNOSIS — J301 Allergic rhinitis due to pollen: Secondary | ICD-10-CM | POA: Diagnosis not present

## 2024-02-23 DIAGNOSIS — J301 Allergic rhinitis due to pollen: Secondary | ICD-10-CM | POA: Diagnosis not present

## 2024-02-23 DIAGNOSIS — J3089 Other allergic rhinitis: Secondary | ICD-10-CM | POA: Diagnosis not present

## 2024-02-29 DIAGNOSIS — J3089 Other allergic rhinitis: Secondary | ICD-10-CM | POA: Diagnosis not present

## 2024-02-29 DIAGNOSIS — J3081 Allergic rhinitis due to animal (cat) (dog) hair and dander: Secondary | ICD-10-CM | POA: Diagnosis not present

## 2024-02-29 DIAGNOSIS — J301 Allergic rhinitis due to pollen: Secondary | ICD-10-CM | POA: Diagnosis not present

## 2024-03-17 DIAGNOSIS — E039 Hypothyroidism, unspecified: Secondary | ICD-10-CM | POA: Diagnosis not present

## 2024-03-17 DIAGNOSIS — J3081 Allergic rhinitis due to animal (cat) (dog) hair and dander: Secondary | ICD-10-CM | POA: Diagnosis not present

## 2024-03-17 DIAGNOSIS — R7303 Prediabetes: Secondary | ICD-10-CM | POA: Diagnosis not present

## 2024-03-17 DIAGNOSIS — J3089 Other allergic rhinitis: Secondary | ICD-10-CM | POA: Diagnosis not present

## 2024-03-17 DIAGNOSIS — E209 Hypoparathyroidism, unspecified: Secondary | ICD-10-CM | POA: Diagnosis not present

## 2024-03-17 DIAGNOSIS — Z87898 Personal history of other specified conditions: Secondary | ICD-10-CM | POA: Diagnosis not present

## 2024-03-17 DIAGNOSIS — J301 Allergic rhinitis due to pollen: Secondary | ICD-10-CM | POA: Diagnosis not present

## 2024-03-17 DIAGNOSIS — Z9189 Other specified personal risk factors, not elsewhere classified: Secondary | ICD-10-CM | POA: Diagnosis not present

## 2024-03-17 DIAGNOSIS — Z92241 Personal history of systemic steroid therapy: Secondary | ICD-10-CM | POA: Diagnosis not present

## 2024-03-21 DIAGNOSIS — N3941 Urge incontinence: Secondary | ICD-10-CM | POA: Diagnosis not present

## 2024-03-23 DIAGNOSIS — J3081 Allergic rhinitis due to animal (cat) (dog) hair and dander: Secondary | ICD-10-CM | POA: Diagnosis not present

## 2024-03-23 DIAGNOSIS — J301 Allergic rhinitis due to pollen: Secondary | ICD-10-CM | POA: Diagnosis not present

## 2024-03-23 DIAGNOSIS — J3089 Other allergic rhinitis: Secondary | ICD-10-CM | POA: Diagnosis not present

## 2024-03-23 DIAGNOSIS — M2041 Other hammer toe(s) (acquired), right foot: Secondary | ICD-10-CM | POA: Diagnosis not present

## 2024-03-23 DIAGNOSIS — R55 Syncope and collapse: Secondary | ICD-10-CM | POA: Diagnosis not present

## 2024-03-28 DIAGNOSIS — J301 Allergic rhinitis due to pollen: Secondary | ICD-10-CM | POA: Diagnosis not present

## 2024-03-28 DIAGNOSIS — J3081 Allergic rhinitis due to animal (cat) (dog) hair and dander: Secondary | ICD-10-CM | POA: Diagnosis not present

## 2024-03-28 DIAGNOSIS — J3089 Other allergic rhinitis: Secondary | ICD-10-CM | POA: Diagnosis not present

## 2024-03-30 ENCOUNTER — Ambulatory Visit (INDEPENDENT_AMBULATORY_CARE_PROVIDER_SITE_OTHER): Admitting: Podiatry

## 2024-03-30 DIAGNOSIS — M2041 Other hammer toe(s) (acquired), right foot: Secondary | ICD-10-CM

## 2024-03-30 NOTE — Progress Notes (Signed)
 Subjective:  Patient ID: Becky Davis, female    DOB: Aug 13, 1950,  MRN: [id number]  Chief Complaint  Patient presents with   Hammer Toe    Right foot hammer toe     73 y.o. female presents with the above complaint.  Patient presents for right second digit hammertoe contracture.  Patient states is painful to touch it rubs against it.  She states she is doing a little bit better she wants to focus on conservative care denies seeing anyone else prior to seeing me.  Wanted to discuss treatment options pain scale 5 out of 10 dull aching nature hurts with ambulation worse with pressure good   Review of Systems: Negative except as noted in the HPI. Denies N/V/F/Ch.  Past Medical History:  Diagnosis Date   Anxiety    Arthritis    knees   Asthma    Depression    GERD (gastroesophageal reflux disease)    Headache    hx of 20 years ago   Heart murmur    hx of 10-15 years ago   Hypertension    Hypothyroidism    Pneumonia    Pre-diabetes    Thyroid  nodule     Current Outpatient Medications:    albuterol  (PROAIR  HFA) 108 (90 Base) MCG/ACT inhaler, Inhale 2 puffs into the lungs as needed (Shortness of breath)., Disp: 6.7 g, Rfl: 3   amantadine  (SYMMETREL ) 100 MG capsule, Take 100 mg by mouth 2 (two) times daily., Disp: , Rfl:    aspirin  EC 325 MG tablet, Take 1 tablet (325 mg total) by mouth 2 (two) times daily., Disp: 60 tablet, Rfl: 0   calcitRIOL  (ROCALTROL ) 0.25 MCG capsule, Take 0.25 mcg by mouth 2 (two) times daily., Disp: , Rfl:    Calcium  Carbonate-Vitamin D  (CALTRATE 600+D PO), Take 600 mg by mouth daily., Disp: , Rfl:    cetirizine (ZYRTEC) 10 MG tablet, Take 10 mg by mouth daily., Disp: , Rfl:    donepezil  (ARICEPT ) 5 MG tablet, Take 1 tablet (5 mg total) by mouth at bedtime., Disp: 30 tablet, Rfl: 2   fluticasone  furoate-vilanterol (BREO ELLIPTA ) 100-25 MCG/ACT AEPB, Inhale 1 puff into the lungs daily., Disp: , Rfl:    hydrALAZINE  (APRESOLINE ) 25 MG tablet, Take 25 mg by  mouth 3 (three) times daily., Disp: , Rfl:    HYDROcodone -acetaminophen  (NORCO) 10-325 MG tablet, Take 1 tablet by mouth every 4 (four) hours as needed., Disp: 30 tablet, Rfl: 0   levothyroxine  (SYNTHROID ) 112 MCG tablet, Take 112 mcg by mouth at bedtime., Disp: , Rfl:    losartan  (COZAAR ) 100 MG tablet, Take 0.5 tablets (50 mg total) by mouth daily. (Patient taking differently: Take 100 mg by mouth daily.), Disp: 5 tablet, Rfl: 0   lurasidone  (LATUDA ) 40 MG TABS tablet, Take 40 mg by mouth at bedtime., Disp: , Rfl:    mirtazapine  (REMERON ) 15 MG tablet, Take 1 tablet (15 mg total) by mouth at bedtime., Disp: 30 tablet, Rfl: 2   montelukast  (SINGULAIR ) 10 MG tablet, Take 10 mg by mouth at bedtime., Disp: , Rfl:    ondansetron  (ZOFRAN ) 4 MG tablet, Take 1 tablet (4 mg total) by mouth every 8 (eight) hours as needed for nausea or vomiting., Disp: 10 tablet, Rfl: 0   rosuvastatin  (CRESTOR ) 10 MG tablet, Take 1 tablet by mouth daily., Disp: , Rfl:    sennosides-docusate sodium  (SENOKOT-S) 8.6-50 MG tablet, Take 2 tablets by mouth daily., Disp: 30 tablet, Rfl: 1  Social History  Tobacco Use  Smoking Status Never  Smokeless Tobacco Never    Allergies  Allergen Reactions   Amlodipine Itching   Dyazide [Hydrochlorothiazide -Triamterene]     Fainting    Lisinopril Other (See Comments)    Sneezing    Prednisone      Had depression in 1991 after extended use of predisone. Pt has taken this medication since in a short time period, and has had no reaction.    Juniper Oil Rash   Penicillins Rash    Patient received ancef  on 12/20/19 with no adverse reaction.    Objective:  There were no vitals filed for this visit. There is no height or weight on file to calculate BMI. Constitutional Well developed. Well nourished.  Vascular Dorsalis pedis pulses palpable bilaterally. Posterior tibial pulses palpable bilaterally. Capillary refill normal to all digits.  No cyanosis or clubbing noted. Pedal  hair growth normal.  Neurologic Normal speech. Oriented to person, place, and time. Epicritic sensation to light touch grossly present bilaterally.  Dermatologic Nails well groomed and normal in appearance. No open wounds. No skin lesions.  Orthopedic: Right second digit hammertoe contracture rigid in nature.  Pain on palpation to the dorsal PIPJ joint.  Hyperkeratotic lesion noted.  No other abnormalities identified   Radiographs: None Assessment:   1. Hammertoe of right foot    Plan:  Patient was evaluated and treated and all questions answered.  Right second digit hammertoe contracture noted - All questions and concerns were discussed with the patient in extensive detail given the amount of pain that she is experiencing she will benefit from shoe gear modification.  I discussed moleskin as well.  If any foot and ankle issues arise she will come back and see me.  In the future she will need second digit hammertoe correction  No follow-ups on file.

## 2024-04-04 ENCOUNTER — Other Ambulatory Visit (HOSPITAL_BASED_OUTPATIENT_CLINIC_OR_DEPARTMENT_OTHER): Payer: Self-pay

## 2024-04-04 DIAGNOSIS — Z23 Encounter for immunization: Secondary | ICD-10-CM | POA: Diagnosis not present

## 2024-04-04 MED ORDER — COMIRNATY 30 MCG/0.3ML IM SUSY
0.3000 mL | PREFILLED_SYRINGE | Freq: Once | INTRAMUSCULAR | 0 refills | Status: AC
  Filled 2024-04-04: qty 0.3, 1d supply, fill #0

## 2024-04-04 MED ORDER — FLUZONE HIGH-DOSE 0.5 ML IM SUSY
0.5000 mL | PREFILLED_SYRINGE | Freq: Once | INTRAMUSCULAR | 0 refills | Status: AC
  Filled 2024-04-04: qty 0.5, 1d supply, fill #0

## 2024-04-07 DIAGNOSIS — Z92241 Personal history of systemic steroid therapy: Secondary | ICD-10-CM | POA: Diagnosis not present

## 2024-04-07 DIAGNOSIS — R55 Syncope and collapse: Secondary | ICD-10-CM | POA: Diagnosis not present

## 2024-04-07 DIAGNOSIS — Z87898 Personal history of other specified conditions: Secondary | ICD-10-CM | POA: Diagnosis not present

## 2024-04-08 DIAGNOSIS — J3089 Other allergic rhinitis: Secondary | ICD-10-CM | POA: Diagnosis not present

## 2024-04-08 DIAGNOSIS — J301 Allergic rhinitis due to pollen: Secondary | ICD-10-CM | POA: Diagnosis not present

## 2024-04-08 DIAGNOSIS — J3081 Allergic rhinitis due to animal (cat) (dog) hair and dander: Secondary | ICD-10-CM | POA: Diagnosis not present

## 2024-04-13 DIAGNOSIS — J3089 Other allergic rhinitis: Secondary | ICD-10-CM | POA: Diagnosis not present

## 2024-04-13 DIAGNOSIS — J301 Allergic rhinitis due to pollen: Secondary | ICD-10-CM | POA: Diagnosis not present

## 2024-04-13 DIAGNOSIS — J3081 Allergic rhinitis due to animal (cat) (dog) hair and dander: Secondary | ICD-10-CM | POA: Diagnosis not present

## 2024-04-17 DIAGNOSIS — R55 Syncope and collapse: Secondary | ICD-10-CM | POA: Diagnosis not present

## 2024-04-18 DIAGNOSIS — J301 Allergic rhinitis due to pollen: Secondary | ICD-10-CM | POA: Diagnosis not present

## 2024-04-18 DIAGNOSIS — J3089 Other allergic rhinitis: Secondary | ICD-10-CM | POA: Diagnosis not present

## 2024-04-18 DIAGNOSIS — J3081 Allergic rhinitis due to animal (cat) (dog) hair and dander: Secondary | ICD-10-CM | POA: Diagnosis not present

## 2024-04-22 DIAGNOSIS — R55 Syncope and collapse: Secondary | ICD-10-CM | POA: Diagnosis not present

## 2024-05-03 DIAGNOSIS — J301 Allergic rhinitis due to pollen: Secondary | ICD-10-CM | POA: Diagnosis not present

## 2024-05-03 DIAGNOSIS — J3089 Other allergic rhinitis: Secondary | ICD-10-CM | POA: Diagnosis not present

## 2024-05-03 DIAGNOSIS — J3081 Allergic rhinitis due to animal (cat) (dog) hair and dander: Secondary | ICD-10-CM | POA: Diagnosis not present

## 2024-05-05 DIAGNOSIS — E039 Hypothyroidism, unspecified: Secondary | ICD-10-CM | POA: Diagnosis not present

## 2024-05-09 DIAGNOSIS — N3941 Urge incontinence: Secondary | ICD-10-CM | POA: Diagnosis not present

## 2024-05-09 DIAGNOSIS — R399 Unspecified symptoms and signs involving the genitourinary system: Secondary | ICD-10-CM | POA: Diagnosis not present

## 2024-05-17 DIAGNOSIS — J3081 Allergic rhinitis due to animal (cat) (dog) hair and dander: Secondary | ICD-10-CM | POA: Diagnosis not present

## 2024-05-17 DIAGNOSIS — J301 Allergic rhinitis due to pollen: Secondary | ICD-10-CM | POA: Diagnosis not present

## 2024-05-17 DIAGNOSIS — J3089 Other allergic rhinitis: Secondary | ICD-10-CM | POA: Diagnosis not present

## 2024-06-08 DIAGNOSIS — J301 Allergic rhinitis due to pollen: Secondary | ICD-10-CM | POA: Diagnosis not present

## 2024-06-08 DIAGNOSIS — J3081 Allergic rhinitis due to animal (cat) (dog) hair and dander: Secondary | ICD-10-CM | POA: Diagnosis not present

## 2024-06-08 DIAGNOSIS — J3089 Other allergic rhinitis: Secondary | ICD-10-CM | POA: Diagnosis not present

## 2024-07-05 ENCOUNTER — Other Ambulatory Visit (HOSPITAL_BASED_OUTPATIENT_CLINIC_OR_DEPARTMENT_OTHER): Payer: Self-pay
